# Patient Record
Sex: Male | Born: 1965 | Race: Black or African American | Hispanic: No | Marital: Single | Smoking: Never smoker
Health system: Southern US, Community
[De-identification: ages and names within clinical notes are randomized; demographics above are authoritative.]

## PROBLEM LIST (undated history)

## (undated) DIAGNOSIS — J3089 Other allergic rhinitis: Secondary | ICD-10-CM

## (undated) HISTORY — PX: NO PAST SURGERIES: SHX2092

---

## 2005-07-07 ENCOUNTER — Emergency Department: Payer: Self-pay | Admitting: Emergency Medicine

## 2006-04-13 ENCOUNTER — Emergency Department: Payer: Self-pay | Admitting: Emergency Medicine

## 2006-12-10 ENCOUNTER — Emergency Department: Payer: Self-pay | Admitting: Emergency Medicine

## 2006-12-10 ENCOUNTER — Other Ambulatory Visit: Payer: Self-pay

## 2007-06-21 ENCOUNTER — Emergency Department: Payer: Self-pay | Admitting: Emergency Medicine

## 2008-12-01 ENCOUNTER — Emergency Department: Payer: Self-pay | Admitting: Emergency Medicine

## 2010-03-07 ENCOUNTER — Emergency Department: Payer: Self-pay | Admitting: Emergency Medicine

## 2010-03-10 ENCOUNTER — Inpatient Hospital Stay: Payer: Self-pay | Admitting: Internal Medicine

## 2010-09-29 ENCOUNTER — Emergency Department: Payer: Self-pay | Admitting: Emergency Medicine

## 2010-10-11 ENCOUNTER — Ambulatory Visit: Payer: Self-pay | Admitting: Family Medicine

## 2011-05-24 ENCOUNTER — Emergency Department: Payer: Self-pay | Admitting: Emergency Medicine

## 2012-02-19 ENCOUNTER — Inpatient Hospital Stay: Payer: Self-pay | Admitting: Psychiatry

## 2012-02-19 LAB — ETHANOL: Ethanol %: <0.003 % (ref 0.000–0.080)

## 2012-02-19 LAB — CBC
HCT: 45.5 % (ref 40.0–52.0)
HGB: 15.3 g/dL (ref 13.0–18.0)
MCHC: 33.6 g/dL (ref 32.0–36.0)
MCV: 86 fL (ref 80–100)
Platelet: 232 10*3/uL (ref 150–440)
RBC: 5.29 10*6/uL (ref 4.40–5.90)
RDW: 14.1 % (ref 11.5–14.5)

## 2012-02-19 LAB — DRUG SCREEN, URINE
Amphetamines, Ur Screen: NEGATIVE (ref ?–1000)
Barbiturates, Ur Screen: NEGATIVE (ref ?–200)
Cannabinoid 50 Ng, Ur ~~LOC~~: NEGATIVE (ref ?–50)
Cocaine Metabolite,Ur ~~LOC~~: NEGATIVE (ref ?–300)
MDMA (Ecstasy)Ur Screen: NEGATIVE (ref ?–500)
Methadone, Ur Screen: NEGATIVE (ref ?–300)
Phencyclidine (PCP) Ur S: NEGATIVE (ref ?–25)

## 2012-02-19 LAB — COMPREHENSIVE METABOLIC PANEL
Albumin: 3.8 g/dL (ref 3.4–5.0)
Alkaline Phosphatase: 94 U/L (ref 50–136)
Anion Gap: 7 (ref 7–16)
BUN: 11 mg/dL (ref 7–18)
Bilirubin,Total: 0.3 mg/dL (ref 0.2–1.0)
Calcium, Total: 8.9 mg/dL (ref 8.5–10.1)
Chloride: 104 mmol/L (ref 98–107)
Co2: 29 mmol/L (ref 21–32)
Creatinine: 1.19 mg/dL (ref 0.60–1.30)
Glucose: 57 mg/dL — ABNORMAL LOW (ref 65–99)
Osmolality: 276 (ref 275–301)
SGOT(AST): 26 U/L (ref 15–37)
SGPT (ALT): 34 U/L
Sodium: 140 mmol/L (ref 136–145)

## 2012-02-19 LAB — ACETAMINOPHEN LEVEL: Acetaminophen: <2 ug/mL

## 2012-02-19 LAB — TSH: Thyroid Stimulating Horm: 0.93 u[IU]/mL

## 2012-05-11 ENCOUNTER — Emergency Department: Payer: Self-pay | Admitting: *Deleted

## 2012-12-27 ENCOUNTER — Emergency Department: Payer: Self-pay | Admitting: Internal Medicine

## 2015-02-11 NOTE — Discharge Summary (Signed)
PATIENT NAME:  Adam Cannon, Adam Cannon MR#:  [id number] DATE OF BIRTH:  1966/04/10  DATE OF ADMISSION:  02/19/2012 DATE OF DISCHARGE:  02/24/2012  HOSPITAL COURSE: See dictated History and Physical for details. This 49 year old man with a history of mental illness came to the Emergency Room at the recommendation of outpatient treatment providers for evaluation for treatment of his psychotic symptoms. Initially, he was denying any psychotic symptoms to me but on referral to other documentation and re-discussion with him, he admitted that he was having auditory hallucinations that at times were possibly command in nature. He also admitted that his social situation was chaotic. He had no place to live. He had been noncompliant with medications. The patient was admitted to the hospital for stabilization. He was continued on paroxetine and started on Navane 2 mg twice a day for auditory hallucinations. He was also given trazodone 100 mg at night as needed for sleep. The patient tolerated medications well. No sign of EPS or akathisia. He has participated appropriately in groups. Not shown any dangerous or aggressive behavior on the unit. Shows reasonably good insight. He states a motivation to stay well and stay on medications. He was open to receiving education about medication treatment. He has expressed optimism about his outpatient followup. After talking with his family, he has made plans to live with his sister temporarily. We have confirmed these. At this point the patient is calm, appropriate, and denying any active symptoms.  The plan is for discharge home with followup at [facility].   LABORATORY DATA: Drug screen was negative. TSH 0.93, which is normal. Alcohol undetectable. Chemistry profile showed a low glucose at 57, probably of no significance.  Hematology was normal. Urinalysis was not done. Acetaminophen and salicylates were negative.   DISCHARGE MEDICATIONS:  1. Navane 2 mg twice a day.  2. Trazodone 100  mg at night for sleep as needed.  3. Paxil 20 mg per day.   MENTAL STATUS EXAM:  Neatly dressed and groomed. Good eye contact. Normal psychomotor activity. No sign of akathisia or EPS or parkinsonism. Affect euthymic, reactive, and appropriate. Mood stated as being good. Thoughts appear logical and directed. No evidence of bizarre or delusional or paranoid thinking. Denies suicidal or homicidal ideation. Shows good insight and judgment. Denies any thought of doing anything violent.   DISPOSITION: Discharged to stay at his sister's house. Follow up with [facility] for mental health care.   DIAGNOSIS PRINCIPLE AND PRIMARY:  AXIS I: Schizophrenia, paranoid type.   SECONDARY DIAGNOSES:  AXIS I: No further diagnosis.   AXIS II: No diagnosis.   AXIS III: No diagnosis.   AXIS IV: Moderate to severe from having a chaotic social situation and medication noncompliance and recent worsening of symptoms.   AXIS V: Functioning at time of discharge: 55.     ____________________________ Audery AmelJohn T. Carmaleta Youngers, MD jtc:bjt D: 02/24/2012 11:31:24 ET T: 02/24/2012 14:45:51 ET JOB#: [id number]  cc: Audery AmelJohn T. Phoenix Dresser, MD, <Dictator> Audery AmelJOHN T Gervase Colberg MD ELECTRONICALLY SIGNED 02/25/2012 12:27
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
## 2015-07-04 ENCOUNTER — Emergency Department
Admission: EM | Admit: 2015-07-04 | Discharge: 2015-07-04 | Disposition: A | Discharge status: Home / Self Care | Payer: Self-pay | Attending: Emergency Medicine | Admitting: Emergency Medicine

## 2015-07-04 ENCOUNTER — Encounter: Payer: Self-pay | Admitting: Emergency Medicine

## 2015-07-04 ENCOUNTER — Emergency Department: Payer: Self-pay

## 2015-07-04 DIAGNOSIS — R05 Cough: Secondary | ICD-10-CM | POA: Insufficient documentation

## 2015-07-04 DIAGNOSIS — J029 Acute pharyngitis, unspecified: Secondary | ICD-10-CM | POA: Insufficient documentation

## 2015-07-04 DIAGNOSIS — R059 Cough, unspecified: Secondary | ICD-10-CM

## 2015-07-04 MED ORDER — BENZONATATE 100 MG PO CAPS: ORAL_CAPSULE | ORAL | Status: DC

## 2015-07-04 NOTE — ED Notes (Signed)
Patient ambulatory to triage with steady gait, without difficulty or distress noted; pt reports x 2wks having prod cough with clear sputum; denies sinus congestion or fever

## 2015-07-04 NOTE — ED Provider Notes (Signed)
[facility] [place] Emergency Department Provider Note  ____________________________________________  Time seen: Approximately 8:42 PM  I have reviewed the triage vital signs and the nursing notes.   HISTORY  Chief Complaint Cough   HPI Adam Cannon is a 49 y.o. male is here with complaint of productive cough for 2 weeks. He states is being clear sputum. He denies any sinus congestion or fever. He is been using some over-the-counter medication such as Tylenol and Robitussin without relief. He denies smoking or history of asthma.Denies any wheezing or shortness of breath.   History reviewed. No pertinent past medical history.  There are no active problems to display for this patient.   History reviewed. No pertinent past surgical history.  Current Outpatient Rx  Name  Route  Sig  Dispense  Refill  . benzonatate (TESSALON PERLES) 100 MG capsule      Take 1-2 every 8 hours for cough   30 capsule   0     Allergies Mucinex and Aspirin  No family history on file.  Social History Social History  Substance Use Topics  . Smoking status: Never Smoker   . Smokeless tobacco: None  . Alcohol Use: No    Review of Systems Constitutional: No fever/chills Eyes: No visual changes. ENT: Positive sore throat. Cardiovascular: Denies chest pain. Respiratory: Denies shortness of breath. Gastrointestinal:  No nausea, no vomiting.  No diarrhea.  No constipation. Genitourinary: Negative for dysuria. Musculoskeletal: Negative for back pain. Skin: Negative for rash. Neurological: Negative for headaches, focal weakness or numbness.  10-point ROS otherwise negative.  ____________________________________________   PHYSICAL EXAM:  VITAL SIGNS: ED Triage Vitals  Enc Vitals Group     BP 07/04/15 1928 125/89 mmHg     Pulse Rate 07/04/15 1928 79     Resp 07/04/15 1928 20     Temp 07/04/15 1928 98.5 F (36.9 C)     Temp Source 07/04/15 1928 Oral     SpO2  07/04/15 1928 99 %     Weight 07/04/15 1928 181 lb (82.101 kg)     Height 07/04/15 1928 5\' 10"  (1.778 m)     Head Cir --      Peak Flow --      Pain Score --      Pain Loc --      Pain Edu? --      Excl. in GC? --     Constitutional: Alert and oriented. Well appearing and in no acute distress. Eyes: Conjunctivae are normal. PERRL. EOMI. Head: Atraumatic. Nose: No congestion/rhinnorhea.  EACs and TMs are clear Mouth/Throat: Mucous membranes are moist.  Oropharynx non-erythematous. Neck: No stridor.  Supple Hematological/Lymphatic/Immunilogical: No cervical lymphadenopathy. Cardiovascular: Normal rate, regular rhythm. Grossly normal heart sounds.  Good peripheral circulation. Respiratory: Normal respiratory effort.  No retractions. Lungs CTAB. Gastrointestinal: Soft and nontender. No distention. Musculoskeletal: No lower extremity tenderness nor edema.  No joint effusions. Neurologic:  Normal speech and language. No gross focal neurologic deficits are appreciated. No gait instability. Skin:  Skin is warm, dry and intact. No rash noted. Psychiatric: Mood and affect are normal. Speech and behavior are normal.  ____________________________________________   LABS (all labs ordered are listed, but only abnormal results are displayed)  Labs Reviewed - No data to display RADIOLOGY  Chest x-ray no active cardiopulmonary disease ____________________________________________   PROCEDURES  Procedure(s) performed: None  Critical Care performed: No  ____________________________________________   INITIAL IMPRESSION / ASSESSMENT AND PLAN / ED COURSE  Pertinent labs &  imaging results that were available during my care of the patient were reviewed by me and considered in my medical decision making (see chart for details).  Patient was given a prescription for [employer]. He is to increase fluids, Tylenol as needed. He will follow-up with [facility]  clinic if any continued  problems. ____________________________________________   FINAL CLINICAL IMPRESSION(S) / ED DIAGNOSES  Final diagnoses:  Cough      Tommi Rumps, PA-C 07/04/15 1610  Jennye Moccasin, MD 07/08/15 1155

## 2015-07-04 NOTE — Discharge Instructions (Signed)
Cough, Adult   A cough is a reflex. It helps you clear your throat and airways. A cough can help heal your body. A cough can last 2 or 3 weeks (acute) or may last more than 8 weeks (chronic). Some common causes of a cough can include an infection, allergy, or a cold.  HOME CARE  · Only take medicine as told by your doctor.  · If given, take your medicines (antibiotics) as told. Finish them even if you start to feel better.  · Use a cold steam vaporizer or humidifier in your home. This can help loosen thick spit (secretions).  · Sleep so you are almost sitting up (semi-upright). Use pillows to do this. This helps reduce coughing.  · Rest as needed.  · Stop smoking if you smoke.  GET HELP RIGHT AWAY IF:  · You have yellowish-white fluid (pus) in your thick spit.  · Your cough gets worse.  · Your medicine does not reduce coughing, and you are losing sleep.  · You cough up blood.  · You have trouble breathing.  · Your pain gets worse and medicine does not help.  · You have a fever.  MAKE SURE YOU:   · Understand these instructions.  · Will watch your condition.  · Will get help right away if you are not doing well or get worse.  Document Released: 06/19/2011 Document Revised: 02/20/2014 Document Reviewed: 06/19/2011  ExitCare® Patient Information ©2015 [employer]. This information is not intended to replace advice given to you by your health care provider. Make sure you discuss any questions you have with your health care provider.

## 2016-10-20 HISTORY — PX: WISDOM TOOTH EXTRACTION: SHX21

## 2018-01-05 ENCOUNTER — Ambulatory Visit
Admission: EM | Admit: 2018-01-05 | Discharge: 2018-01-05 | Disposition: A | Discharge status: Home / Self Care | Payer: Self-pay | Attending: Family Medicine | Admitting: Family Medicine

## 2018-01-05 ENCOUNTER — Other Ambulatory Visit: Payer: Self-pay

## 2018-01-05 ENCOUNTER — Encounter: Payer: Self-pay | Admitting: Emergency Medicine

## 2018-01-05 DIAGNOSIS — J302 Other seasonal allergic rhinitis: Secondary | ICD-10-CM

## 2018-01-05 DIAGNOSIS — H73893 Other specified disorders of tympanic membrane, bilateral: Secondary | ICD-10-CM

## 2018-01-05 DIAGNOSIS — H6593 Unspecified nonsuppurative otitis media, bilateral: Secondary | ICD-10-CM

## 2018-01-05 DIAGNOSIS — H6123 Impacted cerumen, bilateral: Secondary | ICD-10-CM

## 2018-01-05 DIAGNOSIS — H6121 Impacted cerumen, right ear: Secondary | ICD-10-CM

## 2018-01-05 MED ORDER — CETIRIZINE HCL 10 MG PO TABS: 10.0000 mg | ORAL_TABLET | Freq: Every day | ORAL | 0 refills | Status: DC

## 2018-01-05 MED ORDER — AZITHROMYCIN 250 MG PO TABS: ORAL_TABLET | ORAL | 0 refills | Status: DC

## 2018-01-05 MED ORDER — FLUTICASONE PROPIONATE 50 MCG/ACT NA SUSP: 1.0000 | Freq: Every day | NASAL | 2 refills | Status: DC

## 2018-01-05 NOTE — ED Provider Notes (Signed)
[facility]    CSN: [id number] Arrival date & time: 01/05/18  1845     History   Chief Complaint Chief Complaint  Patient presents with  . Otalgia    right    HPI Sharmon LeydenMark E Semple is a 52 y.o. male.   Patient is a 52 year old male who presents with complaint of right earache times 2 days.  Patient denies any pain or other symptoms in his left ear.  Patient denies any fever or chills.  Patient does report a runny nose and a history of seasonal allergies but denies any sinus pressure or cough recently.  Patient denies any change in his hearing      History reviewed. No pertinent past medical history.  There are no active problems to display for this patient.   History reviewed. No pertinent surgical history.     Home Medications    Prior to Admission medications   Medication Sig Start Date End Date Taking? Authorizing Provider  azithromycin (ZITHROMAX Z-PAK) 250 MG tablet Take 2 tablets by mouth the first day followed by one tablet daily for next 4 days. 01/05/18   Candis SchatzHarris, Michael D, PA-C  cetirizine (ZYRTEC) 10 MG tablet Take 1 tablet (10 mg total) by mouth daily. 01/05/18   Candis SchatzHarris, Michael D, PA-C  fluticasone (FLONASE) 50 MCG/ACT nasal spray Place 1 spray into both nostrils daily. 01/05/18   Candis SchatzHarris, Michael D, PA-C    Family History Family History  Problem Relation Age of Onset  . Hypertension Mother   . Hypertension Father     Social History Social History   Tobacco Use  . Smoking status: Never Smoker  . Smokeless tobacco: Never Used  Substance Use Topics  . Alcohol use: No  . Drug use: Not on file     Allergies   Mucinex [guaifenesin er] and Aspirin   Review of Systems Review of Systems  As noted above in HPI.  Other systems reviewed and found to be negative   Physical Exam Triage Vital Signs ED Triage Vitals  Enc Vitals Group     BP 01/05/18 1857 (!) 148/83     Pulse Rate 01/05/18 1857 89     Resp 01/05/18 1857 16     Temp  01/05/18 1857 98.2 F (36.8 C)     Temp Source 01/05/18 1857 Oral     SpO2 01/05/18 1857 100 %     Weight 01/05/18 1856 178 lb (80.7 kg)     Height 01/05/18 1856 5' 10.5" (1.791 m)     Head Circumference --      Peak Flow --      Pain Score 01/05/18 1855 7     Pain Loc --      Pain Edu? --      Excl. in GC? --    No data found.  Updated Vital Signs BP (!) 148/83 (BP Location: Left Arm)   Pulse 89   Temp 98.2 F (36.8 C) (Oral)   Resp 16   Ht 5' 10.5" (1.791 m)   Wt 178 lb (80.7 kg)   SpO2 100%   BMI 25.18 kg/m   Visual Acuity Right Eye Distance:   Left Eye Distance:   Bilateral Distance:    Right Eye Near:   Left Eye Near:    Bilateral Near:     Physical Exam  Constitutional: He appears well-developed and well-nourished. No distress.  HENT:  Head: Normocephalic and atraumatic.  Right Ear: External ear normal. No tenderness (no  tenderness to pulling of ear or insertion of scope). Tympanic membrane is not injected and not erythematous. A middle ear effusion is present.  Left Ear: External ear normal. Tympanic membrane is not injected and not erythematous. A middle ear effusion is present.  Nose: Right sinus exhibits no frontal sinus tenderness. Left sinus exhibits no maxillary sinus tenderness and no frontal sinus tenderness.  Mouth/Throat: Uvula is midline.  Both ear canals initially obstructed with cerumen, right appears more impacted than the left.  Clear post nasal drainage  Eyes: EOM are normal. Pupils are equal, round, and reactive to light.  Neck: Normal range of motion.     UC Treatments / Results  Labs (all labs ordered are listed, but only abnormal results are displayed) Labs Reviewed - No data to display  EKG  EKG Interpretation None       Radiology No results found.  Procedures Procedures (including critical care time)  Medications Ordered in UC Medications - No data to display   Initial Impression / Assessment and Plan / UC Course    I have reviewed the triage vital signs and the nursing notes.  Pertinent labs & imaging results that were available during my care of the patient were reviewed by me and considered in my medical decision making (see chart for details).     Patient complaining of some right earache but not the left.  On initial exam, the right eardrum is a obstructed with cerumen, possibly some impaction.  The left is obstructed but the wax seems moist is not fill the entire ear canal.  Will irrigate the right ear for evaluation.  After irrigation, both canals are clear but there is fluid behind both eardrums but no signs of current infection.  We will give him Flonase and Zyrtec prescription.  We will also give him a prescription for Zithromax that he can take in 3-4 days if he does not have any improvement in his ear pain.  Final Clinical Impressions(s) / UC Diagnoses   Final diagnoses:  Seasonal allergies  Fluid level behind tympanic membrane of both ears    ED Discharge Orders        Ordered    fluticasone (FLONASE) 50 MCG/ACT nasal spray  Daily     01/05/18 1936    cetirizine (ZYRTEC) 10 MG tablet  Daily     01/05/18 1936    azithromycin (ZITHROMAX Z-PAK) 250 MG tablet     01/05/18 1936       Controlled Substance Prescriptions [place] Controlled Substance Registry consulted? Not Applicable   Candis Schatz, PA-C 01/05/18 1610

## 2018-01-05 NOTE — ED Triage Notes (Signed)
Patient c/o right ear pain that started 2 days ago.  

## 2018-01-05 NOTE — Discharge Instructions (Signed)
-  Flonase: 1 spray to each nostril in the mornings.  Spray slightly directed towards the ears -Zyrtec: 1 tablet daily at night -Both of these medications are available over-the-counter and with generics.  Prescription provided as insurance or health savings account may pay for it with a prescription. -Azithromycin: Can start in 2-3 days if no improvement in your ear pain or if ear pain worsens or you develop a fever.

## 2018-01-11 ENCOUNTER — Ambulatory Visit
Admission: EM | Admit: 2018-01-11 | Discharge: 2018-01-11 | Disposition: A | Discharge status: Home / Self Care | Payer: Self-pay | Attending: Family Medicine | Admitting: Family Medicine

## 2018-01-11 ENCOUNTER — Other Ambulatory Visit: Payer: Self-pay

## 2018-01-11 DIAGNOSIS — H6981 Other specified disorders of Eustachian tube, right ear: Secondary | ICD-10-CM

## 2018-01-11 HISTORY — DX: Other allergic rhinitis: J30.89

## 2018-01-11 NOTE — Discharge Instructions (Addendum)
For Pain take Tylenol 500 mg combined with ibuprofen 400 mg every 6 hours as necessary.  Flonase nasal spray once daily with 2 sprays each nostril and sniff gently like sniffing arose.  If you are not improving in 1-2 weeks follow-up with an ear nose and throat specialist

## 2018-01-11 NOTE — ED Provider Notes (Signed)
[facility]    CSN: [id number] Arrival date & time: 01/11/18  1550     History   Chief Complaint Chief Complaint  Patient presents with  . Otalgia    HPI Adam Cannon is a 52 y.o. male.   HPI  52 year old male who was seen here last week for ear pain had undergone cerumen impaction removal given an Z-Pak and Flonase.  At this the patient states that his right ear pain is worse than before.  He states it is a constant 5 out of 10.  The pain will radiate along his jawline inferiorly into his neck. Does Not pop or click. Denies Drainage.  His hearing is better following the removal of the cerumen.  Had no fever or chills.  Is not bother him to move his outer ear.          Past Medical History:  Diagnosis Date  . Environmental and seasonal allergies     There are no active problems to display for this patient.   History reviewed. No pertinent surgical history.     Home Medications    Prior to Admission medications   Medication Sig Start Date End Date Taking? Authorizing Provider  cetirizine (ZYRTEC) 10 MG tablet Take 1 tablet (10 mg total) by mouth daily. 01/05/18   Candis SchatzHarris, Michael D, PA-C  fluticasone (FLONASE) 50 MCG/ACT nasal spray Place 1 spray into both nostrils daily. 01/05/18   Candis SchatzHarris, Michael D, PA-C    Family History Family History  Problem Relation Age of Onset  . Hypertension Mother   . Hypertension Father     Social History Social History   Tobacco Use  . Smoking status: Never Smoker  . Smokeless tobacco: Never Used  Substance Use Topics  . Alcohol use: No  . Drug use: Not on file     Allergies   Mucinex [guaifenesin er] and Aspirin   Review of Systems Review of Systems  Constitutional: Positive for activity change. Negative for chills, fatigue and fever.  HENT: Positive for ear pain. Negative for ear discharge.   All other systems reviewed and are negative.    Physical Exam Triage Vital Signs ED Triage Vitals  Enc  Vitals Group     BP 01/11/18 1606 118/78     Pulse Rate 01/11/18 1606 78     Resp 01/11/18 1606 16     Temp 01/11/18 1606 98 F (36.7 C)     Temp Source 01/11/18 1606 Oral     SpO2 01/11/18 1606 100 %     Weight 01/11/18 1608 178 lb (80.7 kg)     Height 01/11/18 1608 5' 10.5" (1.791 m)     Head Circumference --      Peak Flow --      Pain Score 01/11/18 1608 3     Pain Loc --      Pain Edu? --      Excl. in GC? --    No data found.  Updated Vital Signs BP 118/78 (BP Location: Left Arm)   Pulse 78   Temp 98 F (36.7 C) (Oral)   Resp 16   Ht 5' 10.5" (1.791 m)   Wt 178 lb (80.7 kg)   SpO2 100%   BMI 25.18 kg/m   Visual Acuity Right Eye Distance:   Left Eye Distance:   Bilateral Distance:    Right Eye Near:   Left Eye Near:    Bilateral Near:     Physical Exam  Constitutional: He is oriented to person, place, and time. He appears well-developed and well-nourished. No distress.  HENT:  Head: Normocephalic.  Left Ear: External ear normal.  Nose: Nose normal.  Mouth/Throat: Oropharynx is clear and moist. No oropharyngeal exudate.  Right Ear shows a middle ear effusion present.No   discomfort with the movement of the auricle or tragus.  Canal appears normal.  Eyes: Pupils are equal, round, and reactive to light. EOM are normal. Right eye exhibits no discharge. Left eye exhibits no discharge.  Neck: Normal range of motion.  Musculoskeletal: Normal range of motion.  Lymphadenopathy:    He has no cervical adenopathy.  Neurological: He is alert and oriented to person, place, and time.  Skin: Skin is warm and dry. He is not diaphoretic.  Psychiatric: He has a normal mood and affect. His behavior is normal. Judgment and thought content normal.  Nursing note and vitals reviewed.    UC Treatments / Results  Labs (all labs ordered are listed, but only abnormal results are displayed) Labs Reviewed - No data to display  EKG None Radiology No results  found.  Procedures Procedures (including critical care time)  Medications Ordered in UC Medications - No data to display   Initial Impression / Assessment and Plan / UC Course  I have reviewed the triage vital signs and the nursing notes.  Pertinent labs & imaging results that were available during my care of the patient were reviewed by me and considered in my medical decision making (see chart for details).     Plan: 1. Test/x-ray results and diagnosis reviewed with patient 2. rx as per orders; risks, benefits, potential side effects reviewed with patient 3. Recommend supportive treatment with proper use of Flonase that he will use on a daily basis.  Type biotics have already been given and he does not require any further at this point time.  He may use over-the-counter antihistamines.  I have given him the name of an ear nose and throat specialist that he may contact if he is not improving in a week or 2.  For pain I have recommended Tylenol 500 mg combined with ibuprofen 400 mg to be taken every 6 hours PRN. 4. F/u prn if symptoms worsen or don't improve   Final Clinical Impressions(s) / UC Diagnoses   Final diagnoses:  Eustachian tube dysfunction, right    ED Discharge Orders    None       Controlled Substance Prescriptions [place] Controlled Substance Registry consulted? Not Applicable   Lutricia Feil, PA-C 01/11/18 1721

## 2018-01-11 NOTE — ED Triage Notes (Signed)
Pt reports he was seen last week for right ear pain and cerumen impaction. Had cerumen disimpaction performed here and given ABX and decongestants. Pain is not better.

## 2018-06-24 ENCOUNTER — Other Ambulatory Visit: Payer: Self-pay

## 2018-06-24 ENCOUNTER — Ambulatory Visit
Admission: EM | Admit: 2018-06-24 | Discharge: 2018-06-24 | Disposition: A | Discharge status: Home / Self Care | Payer: Self-pay | Attending: Family Medicine | Admitting: Family Medicine

## 2018-06-24 ENCOUNTER — Encounter: Payer: Self-pay | Admitting: Emergency Medicine

## 2018-06-24 DIAGNOSIS — R0789 Other chest pain: Secondary | ICD-10-CM

## 2018-06-24 DIAGNOSIS — R05 Cough: Secondary | ICD-10-CM

## 2018-06-24 DIAGNOSIS — J069 Acute upper respiratory infection, unspecified: Secondary | ICD-10-CM

## 2018-06-24 MED ORDER — CETIRIZINE-PSEUDOEPHEDRINE ER 5-120 MG PO TB12: 1.0000 | ORAL_TABLET | Freq: Two times a day (BID) | ORAL | 0 refills | Status: DC

## 2018-06-24 MED ORDER — BENZONATATE 200 MG PO CAPS: ORAL_CAPSULE | ORAL | 0 refills | Status: DC

## 2018-06-24 MED ORDER — HYDROCOD POLST-CPM POLST ER 10-8 MG/5ML PO SUER: 5.0000 mL | Freq: Two times a day (BID) | ORAL | 0 refills | Status: DC

## 2018-06-24 MED ORDER — FLUTICASONE PROPIONATE 50 MCG/ACT NA SUSP
2.0000 | Freq: Every day | NASAL | 0 refills | Status: DC
Start: 2018-06-24 — End: 2018-11-01

## 2018-06-24 NOTE — ED Provider Notes (Signed)
[facility]    CSN: [id number] Arrival date & time: 06/24/18  0844     History   Chief Complaint Chief Complaint  Patient presents with  . Cough  . Sinus Problem  . Chest Pain    HPI Adam Cannon is a 52 y.o. male.   HPI  52 year old male presents with sinus congestion, sinus pressure, nasal congestion, headaches, cough and chest congestion.  He has had some chest tightness that started on Monday.  No fever or chills.  The cough has been nonproductive.  Is a non-smoker.  His O2 sats are 99% on room air.        Past Medical History:  Diagnosis Date  . Environmental and seasonal allergies     There are no active problems to display for this patient.   History reviewed. No pertinent surgical history.     Home Medications    Prior to Admission medications   Medication Sig Start Date End Date Taking? Authorizing Provider  benzonatate (TESSALON) 200 MG capsule Take one cap TID PRN cough 06/24/18   Lutricia Feil, PA-C  cetirizine-pseudoephedrine (ZYRTEC-D) 5-120 MG tablet Take 1 tablet by mouth 2 (two) times daily. 06/24/18   Lutricia Feil, PA-C  chlorpheniramine-HYDROcodone (TUSSIONEX PENNKINETIC ER) 10-8 MG/5ML SUER Take 5 mLs by mouth 2 (two) times daily. 06/24/18   Lutricia Feil, PA-C  fluticasone (FLONASE) 50 MCG/ACT nasal spray Place 2 sprays into both nostrils daily. 06/24/18   Lutricia Feil, PA-C    Family History Family History  Problem Relation Age of Onset  . Hypertension Mother   . Hypertension Father     Social History Social History   Tobacco Use  . Smoking status: Never Smoker  . Smokeless tobacco: Never Used  Substance Use Topics  . Alcohol use: No  . Drug use: Never     Allergies   Mucinex [guaifenesin er] and Aspirin   Review of Systems Review of Systems  Constitutional: Positive for activity change. Negative for appetite change, chills, fatigue and fever.  HENT: Positive for congestion, postnasal drip, sinus  pressure and sinus pain.   Respiratory: Positive for cough.   All other systems reviewed and are negative.    Physical Exam Triage Vital Signs ED Triage Vitals  Enc Vitals Group     BP 06/24/18 0854 123/80     Pulse Rate 06/24/18 0854 80     Resp 06/24/18 0854 16     Temp 06/24/18 0854 97.9 F (36.6 C)     Temp Source 06/24/18 0854 Oral     SpO2 06/24/18 0854 99 %     Weight 06/24/18 0852 182 lb (82.6 kg)     Height 06/24/18 0852 5' 10.5" (1.791 m)     Head Circumference --      Peak Flow --      Pain Score 06/24/18 0852 5     Pain Loc --      Pain Edu? --      Excl. in GC? --    No data found.  Updated Vital Signs BP 123/80 (BP Location: Left Arm)   Pulse 80   Temp 97.9 F (36.6 C) (Oral)   Resp 16   Ht 5' 10.5" (1.791 m)   Wt 182 lb (82.6 kg)   SpO2 99%   BMI 25.75 kg/m   Visual Acuity Right Eye Distance:   Left Eye Distance:   Bilateral Distance:    Right Eye Near:   Left  Eye Near:    Bilateral Near:     Physical Exam  Constitutional: He is oriented to person, place, and time. He appears well-developed and well-nourished.  Non-toxic appearance. He does not appear ill. No distress.  HENT:  Head: Normocephalic.  Eyes: Pupils are equal, round, and reactive to light.  Neck: Normal range of motion.  Pulmonary/Chest: Effort normal and breath sounds normal.  Musculoskeletal: Normal range of motion.  Lymphadenopathy:    He has no cervical adenopathy.  Neurological: He is alert and oriented to person, place, and time.  Skin: Skin is warm and dry.  Psychiatric: He has a normal mood and affect. His behavior is normal.  Nursing note and vitals reviewed.    UC Treatments / Results  Labs (all labs ordered are listed, but only abnormal results are displayed) Labs Reviewed - No data to display  EKG None  Radiology No results found.  Procedures Procedures (including critical care time)  Medications Ordered in UC Medications - No data to  display  Initial Impression / Assessment and Plan / UC Course  I have reviewed the triage vital signs and the nursing notes.  Pertinent labs & imaging results that were available during my care of the patient were reviewed by me and considered in my medical decision making (see chart for details).     Plan: 1. Test/x-ray results and diagnosis reviewed with patient 2. rx as per orders; risks, benefits, potential side effects reviewed with patient 3. Recommend supportive treatment with plenty of fluids.  Use Flonase daily for the next 2 to 3 weeks.  We will treat with cough suppressants to allow him to have better rest.  Told him this is likely a viral illness does not require antibiotics at this time.  It will have to run its course.  However if he runs high fevers or is not improving in 7 to 10 days he should return to our clinic or be seen by a primary care physician 4. F/u prn if symptoms worsen or don't improve  Final Clinical Impressions(s) / UC Diagnoses   Final diagnoses:  Upper respiratory tract infection, unspecified type   Discharge Instructions   None    ED Prescriptions    Medication Sig Dispense Auth. Provider   benzonatate (TESSALON) 200 MG capsule Take one cap TID PRN cough 30 capsule Lutricia Feil, PA-C   chlorpheniramine-HYDROcodone (TUSSIONEX PENNKINETIC ER) 10-8 MG/5ML SUER Take 5 mLs by mouth 2 (two) times daily. 115 mL Ovid Curd P, PA-C   fluticasone (FLONASE) 50 MCG/ACT nasal spray Place 2 sprays into both nostrils daily. 16 g Ovid Curd P, PA-C   cetirizine-pseudoephedrine (ZYRTEC-D) 5-120 MG tablet Take 1 tablet by mouth 2 (two) times daily. 30 tablet Lutricia Feil, PA-C     Controlled Substance Prescriptions [place] Controlled Substance Registry consulted? Not Applicable   Lutricia Feil, PA-C 06/24/18 1220

## 2018-06-24 NOTE — ED Triage Notes (Signed)
Patient c/o sinus congestion, sinus pressure, nasal congestion, HAs, cough and chest congestion with some chest tightness that all started on Monday.  Patient denies fevers.

## 2018-10-04 ENCOUNTER — Other Ambulatory Visit: Payer: Self-pay | Admitting: Otolaryngology

## 2018-10-04 DIAGNOSIS — H9201 Otalgia, right ear: Secondary | ICD-10-CM

## 2018-10-04 DIAGNOSIS — R220 Localized swelling, mass and lump, head: Secondary | ICD-10-CM

## 2018-10-25 ENCOUNTER — Ambulatory Visit
Admission: RE | Admit: 2018-10-25 | Discharge: 2018-10-25 | Disposition: A | Discharge status: Home / Self Care | Payer: No Typology Code available for payment source | Source: Ambulatory Visit | Attending: Otolaryngology | Admitting: Otolaryngology

## 2018-10-25 DIAGNOSIS — H9201 Otalgia, right ear: Secondary | ICD-10-CM | POA: Diagnosis present

## 2018-10-25 DIAGNOSIS — R220 Localized swelling, mass and lump, head: Secondary | ICD-10-CM | POA: Insufficient documentation

## 2018-10-25 MED ORDER — IOHEXOL 300 MG/ML  SOLN
75.0000 mL | Freq: Once | INTRAMUSCULAR | Status: AC | PRN
  Administered 2018-10-25: 75 mL via INTRAVENOUS

## 2018-11-01 ENCOUNTER — Ambulatory Visit (INDEPENDENT_AMBULATORY_CARE_PROVIDER_SITE_OTHER): Payer: No Typology Code available for payment source | Admitting: Nurse Practitioner

## 2018-11-01 ENCOUNTER — Encounter: Payer: Self-pay | Admitting: Nurse Practitioner

## 2018-11-01 VITALS — BP 132/76 | HR 85 | Temp 98.6°F | Ht 70.5 in | Wt 189.2 lb

## 2018-11-01 DIAGNOSIS — Z7689 Persons encountering health services in other specified circumstances: Secondary | ICD-10-CM

## 2018-11-01 DIAGNOSIS — S86911A Strain of unspecified muscle(s) and tendon(s) at lower leg level, right leg, initial encounter: Secondary | ICD-10-CM | POA: Diagnosis not present

## 2018-11-01 NOTE — Patient Instructions (Addendum)
Adam Cannon,   Thank you for coming in to clinic today.  1. You have a hamstring muscle strain.  - Start taking Aleve 220 mg one tablet twice daily.  (May take 2 if needed).  - Start taking Tylenol extra strength 1 to 2 tablets every 6-8 hours for aches or fever/chills for next few days as needed.  Do not take more than 3,000 mg in 24 hours from all medicines.  - Use heat and ice.  Apply this for 15 minutes at a time 6-8 times per day.   - Muscle rub with lidocaine, lidocaine patch, Biofreeze, or tiger balm for topical pain relief.  Avoid using this with heat and ice to avoid burns.  2. Consider having a trainer assess proper form.   3. Knee rehab exercises below. - Work on balance half ball for engaging all of the small muscles around your knee.  Please schedule a follow-up appointment with Wilhelmina Mcardle, AGNP. Return in about 2 months (around 12/31/2018) for annual physical.  If you have any other questions or concerns, please feel free to call the clinic or send a message through MyChart. You may also schedule an earlier appointment if necessary.  You will receive a survey after today's visit either digitally by e-mail or paper by [employer]. Your experiences and feedback matter to [place].  Please respond so we know how we are doing as we provide care for you.   Wilhelmina Mcardle, DNP, AGNP-BC Adult Gerontology Nurse Practitioner [facility], [facility]    Knee Exercises              Ask your health care provider which exercises are safe for you. Do exercises exactly as told by your health care provider and adjust them as directed. It is normal to feel mild stretching, pulling, tightness, or discomfort as you do these exercises, but you should stop right away if you feel sudden pain or your pain gets worse.Do not begin these exercises until told by your health care provider. STRETCHING AND RANGE OF MOTION EXERCISES These exercises warm up your muscles and joints and  improve the movement and flexibility of your knee. These exercises also help to relieve pain, numbness, and tingling. Exercise A: Knee Extension, Prone 1. Lie on your abdomen on a bed. 2. Place your left / right knee just beyond the edge of the surface so your knee is not on the bed. You can put a towel under your left / right thigh just above your knee for comfort. 3. Relax your leg muscles and allow gravity to straighten your knee. You should feel a stretch behind your left / right knee. 4. Hold this position for __________ seconds. 5. Scoot up so your knee is supported between repetitions. Repeat __________ times. Complete this stretch __________ times a day. Exercise B: Knee Flexion, Active 1. Lie on your back with both knees straight. If this causes back discomfort, bend your left / right knee so your foot is flat on the floor. 2. Slowly slide your left / right heel back toward your buttocks until you feel a gentle stretch in the front of your knee or thigh. 3. Hold this position for __________ seconds. 4. Slowly slide your left / right heel back to the starting position. Repeat __________ times. Complete this exercise __________ times a day. Exercise C: Quadriceps, Prone 1. Lie on your abdomen on a firm surface, such as a bed or padded floor. 2. Bend your left / right  knee and hold your ankle. If you cannot reach your ankle or pant leg, loop a belt around your foot and grab the belt instead. 3. Gently pull your heel toward your buttocks. Your knee should not slide out to the side. You should feel a stretch in the front of your thigh and knee. 4. Hold this position for __________ seconds. Repeat __________ times. Complete this stretch __________ times a day. Exercise D: Hamstring, Supine 1. Lie on your back. 2. Loop a belt or towel over the ball of your left / right foot. The ball of your foot is on the walking surface, right under your toes. 3. Straighten your left / right knee and  slowly pull on the belt to raise your leg until you feel a gentle stretch behind your knee. ? Do not let your left / right knee bend while you do this. ? Keep your other leg flat on the floor. 4. Hold this position for __________ seconds. Repeat __________ times. Complete this stretch __________ times a day. STRENGTHENING EXERCISES These exercises build strength and endurance in your knee. Endurance is the ability to use your muscles for a long time, even after they get tired. Exercise E: Quadriceps, Isometric 1. Lie on your back with your left / right leg extended and your other knee bent. Put a rolled towel or small pillow under your knee if told by your health care provider. 2. Slowly tense the muscles in the front of your left / right thigh. You should see your kneecap slide up toward your hip or see increased dimpling just above the knee. This motion will push the back of the knee toward the floor. 3. For __________ seconds, keep the muscle as tight as you can without increasing your pain. 4. Relax the muscles slowly and completely. Repeat __________ times. Complete this exercise __________ times a day. Exercise F: Straight Leg Raises - Quadriceps 1. Lie on your back with your left / right leg extended and your other knee bent. 2. Tense the muscles in the front of your left / right thigh. You should see your kneecap slide up or see increased dimpling just above the knee. Your thigh may even shake a bit. 3. Keep these muscles tight as you raise your leg 4-6 inches (10-15 cm) off the floor. Do not let your knee bend. 4. Hold this position for __________ seconds. 5. Keep these muscles tense as you lower your leg. 6. Relax your muscles slowly and completely after each repetition. Repeat __________ times. Complete this exercise __________ times a day. Exercise G: Hamstring, Isometric 1. Lie on your back on a firm surface. 2. Bend your left / right knee approximately __________ degrees. 3. Dig  your left / right heel into the surface as if you are trying to pull it toward your buttocks. Tighten the muscles in the back of your thighs to dig as hard as you can without increasing any pain. 4. Hold this position for __________ seconds. 5. Release the tension gradually and allow your muscles to relax completely for __________ seconds after each repetition. Repeat __________ times. Complete this exercise __________ times a day. Exercise H: Hamstring Curls If told by your health care provider, do this exercise while wearing ankle weights. Begin with __________ weights. Then increase the weight by 1 lb (0.5 kg) increments. Do not wear ankle weights that are more than __________. 1. Lie on your abdomen with your legs straight. 2. Bend your left / right knee as far as  you can without feeling pain. Keep your hips flat against the floor. 3. Hold this position for __________ seconds. 4. Slowly lower your leg to the starting position. Repeat __________ times. Complete this exercise __________ times a day. Exercise I: Squats (Quadriceps) 1. Stand in front of a table, with your feet and knees pointing straight ahead. You may rest your hands on the table for balance but not for support. 2. Slowly bend your knees and lower your hips like you are going to sit in a chair. ? Keep your weight over your heels, not over your toes. ? Keep your lower legs upright so they are parallel with the table legs. ? Do not let your hips go lower than your knees. ? Do not bend lower than told by your health care provider. ? If your knee pain increases, do not bend as low. 3. Hold the squat position for __________ seconds. 4. Slowly push with your legs to return to standing. Do not use your hands to pull yourself to standing. Repeat __________ times. Complete this exercise __________ times a day. Exercise J: Wall Slides (Quadriceps) 1. Lean your back against a smooth wall or door while you walk your feet out 18-24 inches  (46-61 cm) from it. 2. Place your feet hip-width apart. 3. Slowly slide down the wall or door until your knees bend __________ degrees. Keep your knees over your heels, not over your toes. Keep your knees in line with your hips. 4. Hold for __________ seconds. Repeat __________ times. Complete this exercise __________ times a day. Exercise K: Straight Leg Raises - Hip Abductors 1. Lie on your side with your left / right leg in the top position. Lie so your head, shoulder, knee, and hip line up. You may bend your bottom knee to help you keep your balance. 2. Roll your hips slightly forward so your hips are stacked directly over each other and your left / right knee is facing forward. 3. Leading with your heel, lift your top leg 4-6 inches (10-15 cm). You should feel the muscles in your outer hip lifting. ? Do not let your foot drift forward. ? Do not let your knee roll toward the ceiling. 4. Hold this position for __________ seconds. 5. Slowly return your leg to the starting position. 6. Let your muscles relax completely after each repetition. Repeat __________ times. Complete this exercise __________ times a day. Exercise L: Straight Leg Raises - Hip Extensors 1. Lie on your abdomen on a firm surface. You can put a pillow under your hips if that is more comfortable. 2. Tense the muscles in your buttocks and lift your left / right leg about 4-6 inches (10-15 cm). Keep your knee straight as you lift your leg. 3. Hold this position for __________ seconds. 4. Slowly lower your leg to the starting position. 5. Let your leg relax completely after each repetition. Repeat __________ times. Complete this exercise __________ times a day. This information is not intended to replace advice given to you by your health care provider. Make sure you discuss any questions you have with your health care provider. Document Released: 08/20/2005 Document Revised: 06/30/2016 Document Reviewed: 08/12/2015 [employer]  Inter[employer] Patient Education  2019 [employer]

## 2018-11-01 NOTE — Progress Notes (Signed)
Subjective:    Patient ID: Adam LeydenMark E Sidener, male    DOB: [birth year], 53 y.o.   MRN: [id number]  Adam Cannon is a 53 y.o. male presenting on 11/01/2018 for Establish Care (Intermittent sharp pain. Right knee pain. Possible related to injury from exercise or prolong standing. Pt states the knee gives out from time to time x 2 weeks. )  HPI Establish Care New Provider Pt last seen by PCP many years ago.  [facility].    Right Knee pain Knee lifts at gym, stands all day.  Feels catching/weakness.  Then okay after several days.   - Is not having pain today. - Pain last about 1-2 weeks ago.  Sharp pains in back side of kne with "catching" "babying it after that" Rests, keeps elevated that nigh.  Then is okay.  Makes him feel like he is going to fall.  After "catch" shoots around front.  Usually no pain with sitting, stiff when standing/sore after that. - Believes he may have aggravated it on leg curls at gym.  Past Medical History:  Diagnosis Date  . Environmental and seasonal allergies    No past surgical history on file. Social History   Socioeconomic History  . Marital status: Single    Spouse name: Not on file  . Number of children: Not on file  . Years of education: Not on file  . Highest education level: Not on file  Occupational History  . Not on file  Social Needs  . Financial resource strain: Not on file  . Food insecurity:    Worry: Not on file    Inability: Not on file  . Transportation needs:    Medical: Not on file    Non-medical: Not on file  Tobacco Use  . Smoking status: Never Smoker  . Smokeless tobacco: Never Used  Substance and Sexual Activity  . Alcohol use: No  . Drug use: Never  . Sexual activity: Not on file  Lifestyle  . Physical activity:    Days per week: Not on file    Minutes per session: Not on file  . Stress: Not on file  Relationships  . Social connections:    Talks on phone: Not on file    Gets  together: Not on file    Attends religious service: Not on file    Active member of club or organization: Not on file    Attends meetings of clubs or organizations: Not on file    Relationship status: Not on file  . Intimate partner violence:    Fear of current or ex partner: Not on file    Emotionally abused: Not on file    Physically abused: Not on file    Forced sexual activity: Not on file  Other Topics Concern  . Not on file  Social History Narrative  . Not on file   Family History  Problem Relation Age of Onset  . Hypertension Mother   . Hypertension Father    Current Outpatient Medications on File Prior to Visit  Medication Sig  . aspirin 325 MG tablet Take 325 mg by mouth daily.   No current facility-administered medications on file prior to visit.     Review of Systems  Constitutional: Negative for activity change, appetite change, fatigue and unexpected weight change.  HENT: Negative for congestion, hearing loss and trouble swallowing.   Eyes: Negative for visual disturbance.  Respiratory: Negative for choking, shortness of breath  and wheezing.   Cardiovascular: Negative for chest pain and palpitations.  Gastrointestinal: Negative for abdominal pain, blood in stool, constipation and diarrhea.  Genitourinary: Negative for difficulty urinating, discharge, flank pain, genital sores, penile pain, penile swelling, scrotal swelling and testicular pain.  Musculoskeletal: Negative for arthralgias, back pain and myalgias.  Skin: Negative for color change, rash and wound.  Allergic/Immunologic: Negative for environmental allergies.  Neurological: Negative for dizziness, seizures, weakness and headaches.  Psychiatric/Behavioral: Negative for behavioral problems, decreased concentration, dysphoric mood, sleep disturbance and suicidal ideas. The patient is not nervous/anxious.    Per HPI unless specifically indicated above     Objective:    BP 132/76 (BP Location: Left Arm,  Patient Position: Sitting, Cuff Size: Large)   Pulse 85   Temp 98.6 F (37 C) (Oral)   Ht 5' 10.5" (1.791 m)   Wt 189 lb 3.2 oz (85.8 kg)   BMI 26.76 kg/m   Wt Readings from Last 3 Encounters:  11/01/18 189 lb 3.2 oz (85.8 kg)  06/24/18 182 lb (82.6 kg)  01/11/18 178 lb (80.7 kg)    Physical Exam Vitals signs reviewed.  Constitutional:      General: He is not in acute distress.    Appearance: He is well-developed.  HENT:     Head: Normocephalic and atraumatic.  Cardiovascular:     Rate and Rhythm: Normal rate and regular rhythm.     Pulses:          Radial pulses are 2+ on the right side and 2+ on the left side.       Posterior tibial pulses are 1+ on the right side and 1+ on the left side.     Heart sounds: Normal heart sounds, S1 normal and S2 normal.  Pulmonary:     Effort: Pulmonary effort is normal. No respiratory distress.     Breath sounds: Normal breath sounds and air entry.  Musculoskeletal:     Right lower leg: No edema.     Left lower leg: No edema.     Comments: Bilateral Knees Inspection: Normal appearance and symmetrical. No ecchymosis or effusion. Palpation: Non-tender.  No crepitus ROM: Full active ROM bilaterally Special Testing: Lachman / Valgus/Varus tests negative with intact ligaments (ACL, MCL, LCL). McMurray negative without meniscus symptoms. Strength: 5/5 intact knee flex/ext, ankle dorsi/plantarflex Neurovascular: distally intact sensation light touch and pulses   Skin:    General: Skin is warm and dry.     Capillary Refill: Capillary refill takes less than 2 seconds.  Neurological:     Mental Status: He is alert and oriented to person, place, and time.  Psychiatric:        Attention and Perception: Attention normal.        Mood and Affect: Mood and affect normal.        Behavior: Behavior normal. Behavior is cooperative.       Assessment & Plan:   Problem List Items Addressed This Visit    None    Visit Diagnoses    Knee strain,  right, initial encounter    -  Primary Pain likely self-limited.  Muscle strain possible complicated by improper body mechanics with exercise/lifting as this is what patient was participating in prior to onset of pain.  Plan:  1. Treat with OTC pain meds (acetaminophen and ibuprofen).  Discussed alternate dosing and max dosing. 2. Apply heat and/or ice to affected area. 3. May also apply a muscle rub with lidocaine or lidocaine patch after  heat or ice. 4. Provided knee rehab exercises.  Encourage physical therapy, but patient declines today.  Encouraged body mechanics assessment with a certified trainer  5. Follow up 2-4 weeks prn.     Encounter to establish care     Previous PCP was many years ago.  Records will not be requested.  Past medical, family, and surgical history reviewed w/ patient in clinic today.       Follow up plan: Return in about 2 months (around 12/31/2018) for annual physical.  Wilhelmina Mcardle, DNP, AGPCNP-BC Adult Gerontology Primary Care Nurse Practitioner [facility] 11/01/2018, 2:38 PM

## 2018-11-07 ENCOUNTER — Encounter: Payer: Self-pay | Admitting: Nurse Practitioner

## 2018-12-10 ENCOUNTER — Ambulatory Visit
Admission: EM | Admit: 2018-12-10 | Discharge: 2018-12-10 | Disposition: A | Discharge status: Home / Self Care | Payer: No Typology Code available for payment source | Attending: Family Medicine | Admitting: Family Medicine

## 2018-12-10 ENCOUNTER — Encounter: Payer: Self-pay | Admitting: Emergency Medicine

## 2018-12-10 ENCOUNTER — Other Ambulatory Visit: Payer: Self-pay

## 2018-12-10 DIAGNOSIS — J40 Bronchitis, not specified as acute or chronic: Secondary | ICD-10-CM

## 2018-12-10 MED ORDER — BENZONATATE 200 MG PO CAPS: ORAL_CAPSULE | ORAL | 0 refills | Status: DC

## 2018-12-10 MED ORDER — HYDROCOD POLST-CPM POLST ER 10-8 MG/5ML PO SUER: 5.0000 mL | Freq: Two times a day (BID) | ORAL | 0 refills | Status: DC

## 2018-12-10 MED ORDER — PREDNISONE 20 MG PO TABS: ORAL_TABLET | ORAL | 0 refills | Status: DC

## 2018-12-10 NOTE — ED Provider Notes (Signed)
[facility]    CSN: [id number] Arrival date & time: 12/10/18  1012     History   Chief Complaint Chief Complaint  Patient presents with  . Cough    HPI Adam Cannon is a 53 y.o. male.   HPI  -year-old male presents with cough and congestion that he has had for 3 weeks.  Taken over-the-counter preparations none of which have given him relief.  He has had no fever or chills.  He does not endorse a sinus problem.  Will occasionally have some production but this is infrequent.  He definitely states that his nighttime is the worst time for he is more prone to coughing and keeping him awake.  Afebrile.  Pulse rate of 85 respirations of 18 and O2 sats on room air 99%.           Past Medical History:  Diagnosis Date  . Environmental and seasonal allergies     There are no active problems to display for this patient.   Past Surgical History:  Procedure Laterality Date  . NO PAST SURGERIES         Home Medications    Prior to Admission medications   Medication Sig Start Date End Date Taking? Authorizing Provider  benzonatate (TESSALON) 200 MG capsule Take one cap TID PRN cough 12/10/18   Lutricia Feiloemer, Heron Pitcock P, PA-C  chlorpheniramine-HYDROcodone [facility](TUSSIONEX PENNKINETIC ER) 10-8 MG/5ML SUER Take 5 mLs by mouth 2 (two) times daily. 12/10/18   Lutricia Feiloemer, Delorus Langwell P, PA-C  predniSONE (DELTASONE) 20 MG tablet Take 2 tablets (40 mg) daily by mouth 12/10/18   Lutricia Feiloemer, Marcella Dunnaway P, PA-C    Family History Family History  Problem Relation Age of Onset  . Hypertension Mother   . Breast cancer Mother   . Hypertension Father   . Healthy Sister   . Healthy Brother   . Hypertension Maternal Uncle   . Heart attack Maternal Uncle     Social History Social History   Tobacco Use  . Smoking status: Never Smoker  . Smokeless tobacco: Never Used  Substance Use Topics  . Alcohol use: No  . Drug use: Never     Allergies   Mucinex [guaifenesin er] and Aspirin   Review of  Systems Review of Systems  Constitutional: Positive for activity change. Negative for appetite change, chills, fatigue and fever.  HENT: Positive for congestion. Negative for postnasal drip, rhinorrhea, sinus pressure and sinus pain.   Respiratory: Positive for cough, shortness of breath and wheezing.   All other systems reviewed and are negative.    Physical Exam Triage Vital Signs ED Triage Vitals  Enc Vitals Group     BP 12/10/18 1027 133/90     Pulse Rate 12/10/18 1027 85     Resp 12/10/18 1027 18     Temp 12/10/18 1027 98.5 F (36.9 C)     Temp Source 12/10/18 1027 Oral     SpO2 12/10/18 1027 99 %     Weight 12/10/18 1025 190 lb (86.2 kg)     Height 12/10/18 1025 5\' 10"  (1.778 m)     Head Circumference --      Peak Flow --      Pain Score 12/10/18 1025 0     Pain Loc --      Pain Edu? --      Excl. in GC? --    No data found.  Updated Vital Signs BP 133/90 (BP Location: Right Arm)   Pulse 85  Temp 98.5 F (36.9 C) (Oral)   Resp 18   Ht 5\' 10"  (1.778 m)   Wt 190 lb (86.2 kg)   SpO2 99%   BMI 27.26 kg/m   Visual Acuity Right Eye Distance:   Left Eye Distance:   Bilateral Distance:    Right Eye Near:   Left Eye Near:    Bilateral Near:     Physical Exam Vitals signs and nursing note reviewed.  Constitutional:      General: He is not in acute distress.    Appearance: Normal appearance. He is not ill-appearing, toxic-appearing or diaphoretic.  HENT:     Head: Normocephalic.     Right Ear: Tympanic membrane, ear canal and external ear normal.     Left Ear: Tympanic membrane and ear canal normal.     Nose: Nose normal. No congestion or rhinorrhea.     Mouth/Throat:     Mouth: Mucous membranes are moist.     Pharynx: Oropharynx is clear. No oropharyngeal exudate or posterior oropharyngeal erythema.  Eyes:     General:        Right eye: No discharge.        Left eye: No discharge.     Conjunctiva/sclera: Conjunctivae normal.     Pupils: Pupils are  equal, round, and reactive to light.  Neck:     Musculoskeletal: Normal range of motion and neck supple.  Pulmonary:     Effort: Pulmonary effort is normal.     Breath sounds: Normal breath sounds.  Musculoskeletal: Normal range of motion.  Skin:    General: Skin is warm and dry.  Neurological:     General: No focal deficit present.     Mental Status: He is alert and oriented to person, place, and time.  Psychiatric:        Mood and Affect: Mood normal.        Behavior: Behavior normal.        Thought Content: Thought content normal.        Judgment: Judgment normal.      UC Treatments / Results  Labs (all labs ordered are listed, but only abnormal results are displayed) Labs Reviewed - No data to display  EKG None  Radiology No results found.  Procedures Procedures (including critical care time)  Medications Ordered in UC Medications - No data to display  Initial Impression / Assessment and Plan / UC Course  I have reviewed the triage vital signs and the nursing notes.  Pertinent labs & imaging results that were available during my care of the patient were reviewed by me and considered in my medical decision making (see chart for deta  Has had a cough for 3 weeks.  Signs and his physical exam are relatively normal.  Him symptomatically.  We will also give him a prescription for prednisone because of the irritation  focus for his coughing.  If he is not improving in 2 weeks I recommend he return for a follow-up with possible x-ray     Final Clinical Impressions(s) / UC Diagnoses   Final diagnoses:  Bronchitis     Discharge Instructions     Cool mist vaporizer at nighttime.  Not improved in 2 weeks return to our clinic for consideration of an x-ray.    ED Prescriptions    Medication Sig Dispense Auth. Provider   chlorpheniramine-HYDROcodone (TUSSIONEX PENNKINETIC ER) 10-8 MG/5ML SUER Take 5 mLs by mouth 2 (two) times daily. 115 mL Lutricia Feil, PA-C  benzonatate (TESSALON) 200 MG capsule Take one cap TID PRN cough 30 capsule Ovid Curd P, PA-C   predniSONE (DELTASONE) 20 MG tablet Take 2 tablets (40 mg) daily by mouth 8 tablet Lutricia Feil, PA-C     Controlled Substance Prescriptions [place] Controlled Substance Registry consulted? Not Applicable   Lutricia Feil, PA-C 12/10/18 1116

## 2018-12-10 NOTE — ED Triage Notes (Signed)
Patient c/o cough and congestion that started 3 weeks ago. Patient has taken OTC Robitussin, Dayquil, Nyquil with no relief. Denies fever.

## 2018-12-10 NOTE — Discharge Instructions (Signed)
Cool mist vaporizer at nighttime.  Not improved in 2 weeks return to our clinic for consideration of an x-ray.

## 2018-12-22 ENCOUNTER — Emergency Department
Admission: EM | Admit: 2018-12-22 | Discharge: 2018-12-22 | Disposition: A | Discharge status: Home / Self Care | Payer: No Typology Code available for payment source | Attending: Emergency Medicine | Admitting: Emergency Medicine

## 2018-12-22 ENCOUNTER — Other Ambulatory Visit: Payer: Self-pay

## 2018-12-22 ENCOUNTER — Emergency Department: Payer: No Typology Code available for payment source

## 2018-12-22 DIAGNOSIS — Z79899 Other long term (current) drug therapy: Secondary | ICD-10-CM | POA: Diagnosis not present

## 2018-12-22 DIAGNOSIS — R059 Cough, unspecified: Secondary | ICD-10-CM

## 2018-12-22 DIAGNOSIS — R05 Cough: Secondary | ICD-10-CM | POA: Insufficient documentation

## 2018-12-22 DIAGNOSIS — J40 Bronchitis, not specified as acute or chronic: Secondary | ICD-10-CM | POA: Diagnosis not present

## 2018-12-22 DIAGNOSIS — R079 Chest pain, unspecified: Secondary | ICD-10-CM | POA: Diagnosis present

## 2018-12-22 LAB — CBC
HEMATOCRIT: 46.8 % (ref 39.0–52.0)
HEMOGLOBIN: 15.5 g/dL (ref 13.0–17.0)
MCH: 28.7 pg (ref 26.0–34.0)
MCHC: 33.1 g/dL (ref 30.0–36.0)
MCV: 86.7 fL (ref 80.0–100.0)
Platelets: 265 10*3/uL (ref 150–400)
RBC: 5.4 MIL/uL (ref 4.22–5.81)
RDW: 12.8 % (ref 11.5–15.5)
WBC: 9.8 10*3/uL (ref 4.0–10.5)
nRBC: 0 % (ref 0.0–0.2)

## 2018-12-22 LAB — COMPREHENSIVE METABOLIC PANEL
ALT: 27 U/L (ref 0–44)
AST: 19 U/L (ref 15–41)
Albumin: 3.8 g/dL (ref 3.5–5.0)
Alkaline Phosphatase: 71 U/L (ref 38–126)
Anion gap: 8 (ref 5–15)
BILIRUBIN TOTAL: 0.4 mg/dL (ref 0.3–1.2)
BUN: 19 mg/dL (ref 6–20)
CO2: 24 mmol/L (ref 22–32)
Calcium: 8.8 mg/dL — ABNORMAL LOW (ref 8.9–10.3)
Chloride: 106 mmol/L (ref 98–111)
Creatinine, Ser: 0.82 mg/dL (ref 0.61–1.24)
GFR calc Af Amer: >60 mL/min (ref >60)
GFR calc non Af Amer: >60 mL/min (ref >60)
Glucose, Bld: 123 mg/dL — ABNORMAL HIGH (ref 70–99)
Potassium: 3.9 mmol/L (ref 3.5–5.1)
Sodium: 138 mmol/L (ref 135–145)
Total Protein: 7.3 g/dL (ref 6.5–8.1)

## 2018-12-22 LAB — TROPONIN I: Troponin I: <0.03 ng/mL (ref <0.03)

## 2018-12-22 MED ORDER — HYDROCODONE-HOMATROPINE 5-1.5 MG/5ML PO SYRP: 5.0000 mL | ORAL_SOLUTION | Freq: Four times a day (QID) | ORAL | 0 refills | Status: DC | PRN

## 2018-12-22 MED ORDER — AZITHROMYCIN 250 MG PO TABS: ORAL_TABLET | ORAL | 0 refills | Status: DC

## 2018-12-22 NOTE — ED Triage Notes (Signed)
Pt in with co cough x 3 weeks states greenish at times. Pt denies a fever, states has chills at night. STates does have chest tightness all the time and pain at times.

## 2018-12-22 NOTE — Discharge Instructions (Signed)
As we discussed, your evaluation was reassuring today with no obvious sign of infection or emergent illness.  Given that you have been suffering from this cough for an extended period of time, I agreed to give you a course of antibiotics.  Please take the full course of treatment (5 days) as written on the label instructions.  Your electronic medical record indicates that this would be the fourth prescription for cough syrup that she had received in the last 6 months; we are trying some antibiotics at this time but you should follow-up with your primary care doctor in about a week to discuss additional management options and recommendations.  I also suggest that you try taking a Zyrtec (cetirizine) over-the-counter antihistamine in case your symptoms are at least partially caused by allergies.  You were prescribed Tessalon Perles a few days ago and can continue taking those as needed during the day for cough suppression.    Return to the emergency department if you develop new or worsening symptoms that concern you.

## 2018-12-22 NOTE — ED Notes (Signed)
Pt to the er for cough x 3 weeks. Pt is taking OTC meds for cough with no relief. Pt reports sweating at night but no fever. Pt denies sinus drainge at this time. Pt has a hx of bronchitis. Lungs are clear bilaterally. Pt reports coughing up some green phlegm.

## 2018-12-22 NOTE — ED Provider Notes (Signed)
[facility] Emergency Department Provider Note  ____________________________________________   First MD Initiated Contact with Patient 12/22/18 [phone]     (approximate)  I have reviewed the triage vital signs and the nursing notes.   HISTORY  Chief Complaint Chest Pain and Cough    HPI Adam Cannon is a 53 y.o. male who denies any chronic medical history and presents for evaluation of a persistent cough that is been going on for about 3 weeks.  It is occasionally productive of green sputum.  He reports that it started 3 to 4 weeks ago as a sore throat and developed into this cough that he has not been able to get rid of.  Nothing in particular makes it better or worse but his cough seems to be worse at night.  He denies fever/chills although at the start of the process he had some subjective fever.  He denies shortness of breath.  He has had some intermittent chest pain off and on for the last few weeks.  He denies abdominal pain, nausea, vomiting.  He says he is able to workout every day with a cough is really bothering him.  He describes it as severe.  He did not mention it, but the medical record indicates that he has had multiple visits to urgent cares.  He completed a course of prednisone but I do not see a record that he has had antibiotics.  He denies smoking and drug use.         Past Medical History:  Diagnosis Date  . Environmental and seasonal allergies     There are no active problems to display for this patient.   Past Surgical History:  Procedure Laterality Date  . NO PAST SURGERIES      Prior to Admission medications   Medication Sig Start Date End Date Taking? Authorizing Provider  azithromycin (ZITHROMAX) 250 MG tablet Take 2 tablets PO on day 1, then take 1 tablet PO daily for 4 more days 12/22/18   Loleta Rose, MD  benzonatate (TESSALON) 200 MG capsule Take one cap TID PRN cough 12/10/18   Lutricia Feil, PA-C    chlorpheniramine-HYDROcodone [facility] ER) 10-8 MG/5ML SUER Take 5 mLs by mouth 2 (two) times daily. 12/10/18   Lutricia Feil, PA-C  HYDROcodone-homatropine (HYCODAN) 5-1.5 MG/5ML syrup Take 5 mLs by mouth every 6 (six) hours as needed for cough. 12/22/18   Loleta Rose, MD  predniSONE (DELTASONE) 20 MG tablet Take 2 tablets (40 mg) daily by mouth 12/10/18   Lutricia Feil, PA-C    Allergies Mucinex [guaifenesin er] and Aspirin  Family History  Problem Relation Age of Onset  . Hypertension Mother   . Breast cancer Mother   . Hypertension Father   . Healthy Sister   . Healthy Brother   . Hypertension Maternal Uncle   . Heart attack Maternal Uncle     Social History Social History   Tobacco Use  . Smoking status: Never Smoker  . Smokeless tobacco: Never Used  Substance Use Topics  . Alcohol use: No  . Drug use: Never    Review of Systems Constitutional: No fever/chills Eyes: No visual changes. ENT: No sore throat. Cardiovascular: Occasional chest pain for the last couple of weeks, none currently. Respiratory: Persistent severe cough as described above.  Denies shortness of breath. Gastrointestinal: No abdominal pain.  No nausea, no vomiting.  No diarrhea.  No constipation. Genitourinary: Negative for dysuria. Musculoskeletal: Negative for neck pain.  Negative for back pain. Integumentary: Negative for rash. Neurological: Negative for headaches, focal weakness or numbness.   ____________________________________________   PHYSICAL EXAM:  VITAL SIGNS: ED Triage Vitals [12/22/18 0111]  Enc Vitals Group     BP      Pulse      Resp      Temp      Temp src      SpO2      Weight 87.1 kg (192 lb)     Height 1.778 m ( )     Head Circumference      Peak Flow      Pain Score 8     Pain Loc      Pain Edu?      Excl. in GC?     Constitutional: Alert and oriented. Well appearing and in no acute distress. Eyes: Conjunctivae are normal.  Head:  Atraumatic. Nose: No congestion/rhinnorhea. Mouth/Throat: Mucous membranes are moist. Neck: No stridor.  No meningeal signs.   Cardiovascular: Normal rate, regular rhythm. Good peripheral circulation. Grossly normal heart sounds. Respiratory: Normal respiratory effort.  No retractions. Lungs CTAB. Gastrointestinal: Soft and nontender. No distention.  Musculoskeletal: No lower extremity tenderness nor edema. No gross deformities of extremities. Neurologic:  Normal speech and language. No gross focal neurologic deficits are appreciated.  Skin:  Skin is warm, dry and intact. No rash noted. Psychiatric: Mood and affect are normal. Speech and behavior are normal.  ____________________________________________   LABS (all labs ordered are listed, but only abnormal results are displayed)  Labs Reviewed  COMPREHENSIVE METABOLIC PANEL - Abnormal; Notable for the following components:      Result Value   Glucose, Bld 123 (*)    Calcium 8.8 (*)    All other components within normal limits  CBC  TROPONIN I   ____________________________________________  EKG  ED ECG REPORT I, Loleta Rose, the attending physician, personally viewed and interpreted this ECG.  Date: 12/22/2018 EKG Time: 1:12 Rate: 87 Rhythm: normal sinus rhythm QRS Axis: normal Intervals: normal ST/T Wave abnormalities: normal Narrative Interpretation: no evidence of acute ischemia.  Baseline wander.  ____________________________________________  RADIOLOGY Marylou Mccoy, personally viewed and evaluated these images (plain radiographs) as part of my medical decision making, as well as reviewing the written report by the radiologist.  ED MD interpretation:  No acute abnormalities  Official radiology report(s): Dg Chest 2 View  Result Date: 12/22/2018 CLINICAL DATA:  Chest pain EXAM: CHEST - 2 VIEW COMPARISON:  07/04/2015 FINDINGS: The heart size and mediastinal contours are within normal limits. Both lungs are  clear. The visualized skeletal structures are unremarkable. IMPRESSION: No active cardiopulmonary disease. Electronically Signed   By: Jasmine Pang M.D.   On: 12/22/2018 01:48    ____________________________________________   PROCEDURES   Procedure(s) performed (including Critical Care):  Procedures   ____________________________________________   INITIAL IMPRESSION / MDM / ASSESSMENT AND PLAN / ED COURSE  As part of my medical decision making, I reviewed the following data within the electronic MEDICAL RECORD NUMBER Nursing notes reviewed and incorporated, Labs reviewed , EKG interpreted , Old chart reviewed, Radiograph reviewed , Notes from prior ED visits and [place] Controlled Substance Database         Differential diagnosis includes, but is not limited to, viral bronchitis, ACE inhibitor cough, acid reflux, pneumonia, other nonspecific viral illness, psychogenic cough.  The patient is well-appearing and in no distress.  He reports his symptoms have been going on for about 3 weeks but  a review of the [place] controlled substance database indicates that he is gotten multiple prescriptions for cough medicine over the last year.  He has clear lungs with no wheezing and reports no tobacco use history.  He is obviously very anxious about the cough and concerned that he is developing pneumonia.  I see that he has had multiple urgent care visits but I do not see that he has been on antibiotics although he has had a course of prednisone.  I truly believe that the patient is concerned about his cough but I do not have a specific diagnosis at this time.  I explained to him that even though I typically do not prescribe empiric antibiotics for what appears to be a viral bronchitis, we will try a Z-Pak this time.  I will give him another prescription for cough medicine to help at night.  He already has [employer] that were prescribed a few days ago.  Also recommend that he try taking a  cetirizine daily which may help with his symptoms are at least in part allergic.  He understands and agrees with the plan will follow-up with his outpatient doctor in about a week.  I gave my usual customary return precautions.     ____________________________________________  FINAL CLINICAL IMPRESSION(S) / ED DIAGNOSES  Final diagnoses:  Cough  Bronchitis     MEDICATIONS GIVEN DURING THIS VISIT:  Medications - No data to display   ED Discharge Orders         Ordered    azithromycin (ZITHROMAX) 250 MG tablet     12/22/18 0533    HYDROcodone-homatropine (HYCODAN) 5-1.5 MG/5ML syrup  Every 6 hours PRN     12/22/18 0533           Note:  This document was prepared using Dragon voice recognition software and may include unintentional dictation errors.   Loleta Rose, MD 12/22/18 [phone]

## 2019-01-10 ENCOUNTER — Encounter: Payer: No Typology Code available for payment source | Admitting: Nurse Practitioner

## 2019-01-24 ENCOUNTER — Encounter: Payer: Self-pay | Admitting: Nurse Practitioner

## 2019-01-24 ENCOUNTER — Ambulatory Visit (INDEPENDENT_AMBULATORY_CARE_PROVIDER_SITE_OTHER): Payer: No Typology Code available for payment source | Admitting: Nurse Practitioner

## 2019-01-24 ENCOUNTER — Other Ambulatory Visit: Payer: Self-pay

## 2019-01-24 VITALS — BP 116/76 | HR 74 | Temp 98.6°F | Ht 70.0 in | Wt 190.2 lb

## 2019-01-24 DIAGNOSIS — I861 Scrotal varices: Secondary | ICD-10-CM | POA: Diagnosis not present

## 2019-01-24 NOTE — Patient Instructions (Addendum)
Adam Cannon,   Thank you for coming in to clinic today.  1. Aleve two tablets (440 mg) twice daily.  2. Apply heat and ice as needed for 15 minutes 2-3 times daily or when aching more.  3. Continue wearing supportive briefs.  4. Limit high intensity exercises, bike riding.   Please schedule a follow-up appointment with Wilhelmina Mcardle, AGNP. Return 2 weeks if symptoms worsen or fail to improve.  If you have any other questions or concerns, please feel free to call the clinic or send a message through MyChart. You may also schedule an earlier appointment if necessary.  You will receive a survey after today's visit either digitally by e-mail or paper by [employer]. Your experiences and feedback matter to [place].  Please respond so we know how we are doing as we provide care for you.   Wilhelmina Mcardle, DNP, AGNP-BC Adult Gerontology Nurse Practitioner [facility], [facility]   Varicocele  A varicocele is a swelling of veins in the scrotum. The scrotum is the sac that contains the testicles. Varicoceles can occur on either side of the scrotum, but they are more common on the left side. They occur most often in teenage boys and young men. In most cases, varicoceles are not a serious problem. They are usually small and painless and do not require treatment. Tests may be done to confirm the diagnosis. Treatment may be needed if:  A varicocele is large, causes a lot of pain, or causes pain when exercising.  Varicoceles are found on both sides of the scrotum.  A varicocele causes a decrease in the size of the testicle in a growing adolescent.  The person has fertility problems. What are the causes? This condition is the result of valves in the veins not working properly. Valves in the veins help to return blood from the scrotum and testicles to the heart. If these valves do not work well, blood flows backward and backs up into the veins, which causes the veins to swell. This is  similar to what happens when varicose veins form in the leg. What are the signs or symptoms? Most varicoceles do not cause any symptoms. If symptoms do occur, they may include:  Swelling on one side of the scrotum. The swelling may be more obvious when you are standing up.  A lumpy feeling in the scrotum.  A heavy feeling on one side of the scrotum.  A dull ache in the scrotum, especially after exercise or prolonged standing or sitting.  Slower growth or reduced size of the testicle on the side of the varicocele (in young males).  Problems with fertility. This can occur if the testicle does not grow normally. How is this diagnosed? This condition may be diagnosed with a physical exam. You may also have an imaging test called an ultrasound to confirm the diagnosis and to help rule out other causes of the swelling. How is this treated? Treatment is usually not needed for this condition. If you have any pain, your health care provider may prescribe or recommend medicine to help relieve it. You may need regular exams so your health care provider can monitor the varicocele to ensure that it does not cause problems. When further treatment is needed, it may involve one of these options:  Varicocelectomy. This is a surgery in which the swollen veins are tied off so that the flow of blood goes to other veins instead.  Embolization. In this procedure, a small tube (catheter)  is used to place metal coils or other blocking items in the veins. This cuts off the blood flow to the swollen veins. Follow these instructions at home:  Take over-the-counter and prescription medicines only as told by your health care provider.  Wear supportive underwear.  Use an athletic supporter when participating in sports activities.  Keep all follow-up visits as told by your health care provider. This is important. Contact a health care provider if:  Your pain is increasing.  You have redness in the affected  area.  Your testicle becomes enlarged, swollen, or painful.  You have swelling that does not decrease when you are lying down.  One of your testicles is smaller than the other. Summary  Varicocele is a condition in which the veins in the scrotum are swollen or enlarged.  In most cases, varicoceles do not require treatment.  Treatment may be needed if you have pain, have problems with infertility, or have a smaller testicle associated with the varicocele.  In some cases, the condition may be treated with a procedure to cut off the flow of blood to the swollen veins. This information is not intended to replace advice given to you by your health care provider. Make sure you discuss any questions you have with your health care provider. Document Released: 01/12/2001 Document Revised: 07/22/2017 Document Reviewed: 07/22/2017 Elsevier Interactive Patient Education  2019 [employer].

## 2019-01-24 NOTE — Progress Notes (Signed)
Subjective:    Patient ID: Adam Cannon, male    DOB: 1966-04-18, 53 y.o.   MRN: [id number]  Adam Cannon is a 53 y.o. male presenting on 01/24/2019 for Groin Injury (pt state he was hit in the groin area x 2 weeks ago with a basketball. The pain subsided shortly after the injury, but then returned 2 days later and subsided. Now yesterday 2 days after the injury the pain returned and been persistent.)  HPI LEFT testicular/groin pain No pain like this prior.  2 weeks ago, had basketball hit him in groin.  Left testicle had immediate pain. 2 days later had mild soreness but resolved.  Again had pain return yesterday with aching. - Bayer aspirin, Aleve is not helping pain.  Patient always wears supportive briefs/boxers.  Social History   Tobacco Use  . Smoking status: Never Smoker  . Smokeless tobacco: Never Used  Substance Use Topics  . Alcohol use: No  . Drug use: Never    Review of Systems Per HPI unless specifically indicated above     Objective:    BP 116/76   Pulse 74   Temp 98.6 F (37 C) (Oral)   Ht 5\' 10"  (1.778 m)   Wt 190 lb 3.2 oz (86.3 kg)   BMI 27.29 kg/m   Wt Readings from Last 3 Encounters:  01/24/19 190 lb 3.2 oz (86.3 kg)  12/22/18 192 lb (87.1 kg)  12/10/18 190 lb (86.2 kg)    Physical Exam Vitals signs reviewed.  Constitutional:      General: He is not in acute distress.    Appearance: He is well-developed and normal weight.  HENT:     Head: Normocephalic and atraumatic.  Cardiovascular:     Rate and Rhythm: Normal rate and regular rhythm.     Pulses: Normal pulses.     Heart sounds: Normal heart sounds.  Pulmonary:     Effort: Pulmonary effort is normal.     Breath sounds: Normal breath sounds.  Genitourinary:    Comments: Genital Exam chaperoned by Elvina Mattes, CMA  Genital: penis normal shape without lesions or urethral discharge, scrotum intact without masses, spermatic cords palpated on RIGHT without edema or tenderness,epididymis  normal without swelling or tenderness bilateral testicles descended equal in size and RIGHT non-tender to palpation.  No inguinal hernia or lymphadenopathy.  LEFT spermatic cords palpated with mild edema and tenderness. Compressible venous bulging noted near top of LEFT testicle and including spermatic cords. LEFT testicle mildly tender at anterosuperior aspect.  Testicle oriented vertically.  Normal cremasteric reflex. Skin:    General: Skin is warm and dry.     Capillary Refill: Capillary refill takes less than 2 seconds.  Neurological:     General: No focal deficit present.     Mental Status: He is alert and oriented to person, place, and time. Mental status is at baseline.  Psychiatric:        Mood and Affect: Mood normal.        Behavior: Behavior normal.        Thought Content: Thought content normal.        Judgment: Judgment normal.      Results for orders placed or performed during the hospital encounter of 12/22/18  CBC  Result Value Ref Range   WBC 9.8 4.0 - 10.5 K/uL   RBC 5.40 4.22 - 5.81 MIL/uL   Hemoglobin 15.5 13.0 - 17.0 g/dL   HCT 27.7 82.4 - 23.5 %  MCV 86.7 80.0 - 100.0 fL   MCH 28.7 26.0 - 34.0 pg   MCHC 33.1 30.0 - 36.0 g/dL   RDW 45.412.8 09.811.5 - 11.915.5 %   Platelets 265 150 - 400 K/uL   nRBC 0.0 0.0 - 0.2 %  Comprehensive metabolic panel  Result Value Ref Range   Sodium 138 135 - 145 mmol/L   Potassium 3.9 3.5 - 5.1 mmol/L   Chloride 106 98 - 111 mmol/L   CO2 24 22 - 32 mmol/L   Glucose, Bld 123 (H) 70 - 99 mg/dL   BUN 19 6 - 20 mg/dL   Creatinine, Ser 1.470.82 0.61 - 1.24 mg/dL   Calcium 8.8 (L) 8.9 - 10.3 mg/dL   Total Protein 7.3 6.5 - 8.1 g/dL   Albumin 3.8 3.5 - 5.0 g/dL   AST 19 15 - 41 U/L   ALT 27 0 - 44 U/L   Alkaline Phosphatase 71 38 - 126 U/L   Total Bilirubin 0.4 0.3 - 1.2 mg/dL   GFR calc non Af Amer >60 >60 mL/min   GFR calc Af Amer >60 >60 mL/min   Anion gap 8 5 - 15  Troponin I - ONCE - STAT  Result Value Ref Range   Troponin I <0.03  <0.03 ng/mL      Assessment & Plan:   Problem List Items Addressed This Visit    None    Visit Diagnoses    Left varicocele    -  Primary     On exam, patient identified to have new left varicocele after left testicular trauma 2 weeks ago.  Pain was always described as general aching and there is no current late sign of testicular torsion at time of original injury.  No current evidence of testicular emergency on exam.  Plan: 1. Conservative treatment for next 2 weeks given inadequate conservative therapy in last 2 weeks. - Wear supportive briefs. - Apply heat and/or ice prn 15 mins 2-3 times daily - Limit high intensity exercises, anything that increases scrotal movement/vibration like motorcycle riding - START Aleeve 2 tablets (440 mg) twice daily. 2. May need scrotal ultrasound/referral to Urology in future if pain is not resolving.  Patient to call clinic in 2 weeks. 3. FOLLOW-UP 2 weeks prn.   Follow up plan: Return 2 weeks if symptoms worsen or fail to improve.  Wilhelmina McardleLauren Saafir Abdullah, DNP, AGPCNP-BC Adult Gerontology Primary Care Nurse Practitioner [facility] 01/24/2019, 11:02 AM

## 2019-06-06 ENCOUNTER — Other Ambulatory Visit: Payer: Self-pay

## 2019-06-06 ENCOUNTER — Ambulatory Visit (INDEPENDENT_AMBULATORY_CARE_PROVIDER_SITE_OTHER): Payer: No Typology Code available for payment source | Admitting: Nurse Practitioner

## 2019-06-06 VITALS — BP 131/83 | Ht 70.0 in | Wt 193.8 lb

## 2019-06-06 DIAGNOSIS — I861 Scrotal varices: Secondary | ICD-10-CM

## 2019-06-06 MED ORDER — DICLOFENAC SODIUM 75 MG PO TBEC: 75.0000 mg | DELAYED_RELEASE_TABLET | Freq: Two times a day (BID) | ORAL | 0 refills | Status: DC

## 2019-06-06 NOTE — Patient Instructions (Addendum)
Adam Cannon,   Thank you for coming in to clinic today.  1. Urology will call you to schedule an appointment. They will confirm that a varicocele is present or not and offer other treatment options.  Please schedule a follow-up appointment with Cassell Smiles, AGNP. Return if symptoms worsen or fail to improve.  If you have any other questions or concerns, please feel free to call the clinic or send a message through [place]. You may also schedule an earlier appointment if necessary.  You will receive a survey after today's visit either digitally by e-mail or paper by [employer]. Your experiences and feedback matter to [place].  Please respond so we know how we are doing as we provide care for you.   Cassell Smiles, DNP, AGNP-BC Adult Gerontology Nurse Practitioner [place]

## 2019-06-06 NOTE — Progress Notes (Signed)
Subjective:    Patient ID: Adam Cannon, male    DOB: 11/29/1965, 53 y.o.   MRN: [id number]  Adam Cannon is a 53 y.o. male presenting on 06/06/2019 for Groin Pain (intermittent groin pain. Pt state that he felt like the pain was improving at one time, but then it returned. Some days are worse then other.)  HPI LEFT groin pain Initial onset early April 2020 after trauma to the scrotal area with interval improvement.  Patient notes he has had no pain some days.  Now pain has returned about 3 weeks ago without known trigger and patient desires re-examination/next steps.  - Aleve still helps pain minimally. - Patient notes no ongoing lump/swelling.  - Patient stays on feet as a barber, nearly constant movement - Continues wearing supportive boxer briefs  Social History   Tobacco Use  . Smoking status: Never Smoker  . Smokeless tobacco: Never Used  Substance Use Topics  . Alcohol use: No  . Drug use: Never    Review of Systems Per HPI unless specifically indicated above     Objective:    BP 131/83 (BP Location: Left Arm, Patient Position: Sitting, Cuff Size: Large)   Ht 5\' 10"  (1.778 m)   Wt 193 lb 12.8 oz (87.9 kg)   BMI 27.81 kg/m   Wt Readings from Last 3 Encounters:  06/06/19 193 lb 12.8 oz (87.9 kg)  01/24/19 190 lb 3.2 oz (86.3 kg)  12/22/18 192 lb (87.1 kg)    Physical Exam Vitals signs reviewed.  Constitutional:      General: He is not in acute distress.    Appearance: He is well-developed.  HENT:     Head: Normocephalic and atraumatic.  Cardiovascular:     Rate and Rhythm: Normal rate and regular rhythm.     Pulses:          Radial pulses are 2+ on the right side and 2+ on the left side.       Posterior tibial pulses are 1+ on the right side and 1+ on the left side.     Heart sounds: Normal heart sounds, S1 normal and S2 normal.  Pulmonary:     Effort: Pulmonary effort is normal. No respiratory distress.     Breath sounds: Normal breath sounds and air  entry.  Genitourinary:    Comments: Genital and Rectal Exam chaperoned by Frederich Cha, CMA Genital: circumcised penis normal shape without lesions or urethral discharge, scrotum intact without masses, LEFT sided spermatic cords palpated with enlargement near top of testicle with tenderness.  Epididymis normal without swelling or tenderness.  Bilateral testicles descended equal in size and non-tender to palpation.  No inguinal hernia or lymphadenopathy. Musculoskeletal:     Right lower leg: No edema.     Left lower leg: No edema.  Skin:    General: Skin is warm and dry.     Capillary Refill: Capillary refill takes less than 2 seconds.  Neurological:     Mental Status: He is alert and oriented to person, place, and time.  Psychiatric:        Attention and Perception: Attention normal.        Mood and Affect: Mood and affect normal.        Behavior: Behavior normal. Behavior is cooperative.    Results for orders placed or performed during the hospital encounter of 12/22/18  CBC  Result Value Ref Range   WBC 9.8 4.0 - 10.5 K/uL   RBC  5.40 4.22 - 5.81 MIL/uL   Hemoglobin 15.5 13.0 - 17.0 g/dL   HCT 40.946.8 81.139.0 - 91.452.0 %   MCV 86.7 80.0 - 100.0 fL   MCH 28.7 26.0 - 34.0 pg   MCHC 33.1 30.0 - 36.0 g/dL   RDW 78.212.8 95.611.5 - 21.315.5 %   Platelets 265 150 - 400 K/uL   nRBC 0.0 0.0 - 0.2 %  Comprehensive metabolic panel  Result Value Ref Range   Sodium 138 135 - 145 mmol/L   Potassium 3.9 3.5 - 5.1 mmol/L   Chloride 106 98 - 111 mmol/L   CO2 24 22 - 32 mmol/L   Glucose, Bld 123 (H) 70 - 99 mg/dL   BUN 19 6 - 20 mg/dL   Creatinine, Ser 0.860.82 0.61 - 1.24 mg/dL   Calcium 8.8 (L) 8.9 - 10.3 mg/dL   Total Protein 7.3 6.5 - 8.1 g/dL   Albumin 3.8 3.5 - 5.0 g/dL   AST 19 15 - 41 U/L   ALT 27 0 - 44 U/L   Alkaline Phosphatase 71 38 - 126 U/L   Total Bilirubin 0.4 0.3 - 1.2 mg/dL   GFR calc non Af Amer >60 >60 mL/min   GFR calc Af Amer >60 >60 mL/min   Anion gap 8 5 - 15  Troponin I - ONCE -  STAT  Result Value Ref Range   Troponin I <0.03 <0.03 ng/mL      Assessment & Plan:   Problem List Items Addressed This Visit    None    Visit Diagnoses    Left varicocele    -  Primary   Relevant Medications   diclofenac (VOLTAREN) 75 MG EC tablet   Other Relevant Orders   Ambulatory referral to Urology    Likely persistent varicocele with adequate conservative therapy to date.  No known trigger for return of pain.  Plan: 1. Referral urology.  Evaluate anatomy likely with US to verify diagnosis. 2. May start diclofenac 75 mg bid x 14 days.  STOP all other OTC NSAIDs 3. Follow-up with PCP prn,  Call if no urology appointment made in next 7 days for referral status.   Meds ordered this encounter  Medications  . diclofenac (VOLTAREN) 75 MG EC tablet    Sig: Take 1 tablet (75 mg total) by mouth 2 (two) times daily.    Dispense:  30 tablet    Refill:  0    Order Specific Question:   Supervising Provider    Answer:   Smitty CordsKARAMALEGOS, ALEXANDER J [[id number]]   Follow up plan: Return if symptoms worsen or fail to improve.  Wilhelmina McardleLauren Ekaterina Denise, DNP, AGPCNP-BC Adult Gerontology Primary Care Nurse Practitioner [facility] 06/06/2019, 10:59 AM

## 2019-06-07 ENCOUNTER — Encounter: Payer: Self-pay | Admitting: Nurse Practitioner

## 2019-07-12 ENCOUNTER — Ambulatory Visit: Payer: No Typology Code available for payment source | Admitting: Urology

## 2019-07-13 ENCOUNTER — Ambulatory Visit: Payer: No Typology Code available for payment source | Admitting: Urology

## 2019-08-31 ENCOUNTER — Ambulatory Visit: Payer: No Typology Code available for payment source | Admitting: Urology

## 2019-12-15 ENCOUNTER — Encounter: Payer: Self-pay | Admitting: Family Medicine

## 2019-12-15 ENCOUNTER — Other Ambulatory Visit: Payer: Self-pay

## 2019-12-15 ENCOUNTER — Ambulatory Visit (INDEPENDENT_AMBULATORY_CARE_PROVIDER_SITE_OTHER): Payer: No Typology Code available for payment source | Admitting: Family Medicine

## 2019-12-15 DIAGNOSIS — H6983 Other specified disorders of Eustachian tube, bilateral: Secondary | ICD-10-CM

## 2019-12-15 DIAGNOSIS — H6993 Unspecified Eustachian tube disorder, bilateral: Secondary | ICD-10-CM | POA: Insufficient documentation

## 2019-12-15 MED ORDER — FLUTICASONE PROPIONATE 50 MCG/ACT NA SUSP: 2.0000 | Freq: Every day | NASAL | 6 refills | Status: DC

## 2019-12-15 MED ORDER — GUAIFENESIN ER 1200 MG PO TB12: 1.0000 | ORAL_TABLET | Freq: Two times a day (BID) | ORAL | 0 refills | Status: AC

## 2019-12-15 NOTE — Progress Notes (Signed)
I have reviewed this encounter including the documentation in this note and/or discussed this patient with the provider, Danielle Rankin FNP. I am certifying that I agree with the content of this note as supervising physician.  Saralyn Pilar, DO [facility] 12/15/2019, 3:53 PM

## 2019-12-15 NOTE — Progress Notes (Signed)
Virtual Visit via Telephone The purpose of this virtual visit is to provide medical care while limiting exposure to the novel coronavirus (COVID19) for both patient and office staff.  Consent was obtained for phone visit:  Yes.   Answered questions that patient had about telehealth interaction:  Yes.   I discussed the limitations, risks, security and privacy concerns of performing an evaluation and management service by telephone. I also discussed with the patient that there may be a patient responsible charge related to this service. The patient expressed understanding and agreed to proceed.  Patient Location: Home Provider Location: [facility])  ---------------------------------------------------------------------- Chief Complaint  Patient presents with  . Ear Pain    back side of right ear. Pt has allergies and runny nose. pain since sunday and some relief when taking claritin    S: Reviewed CMA documentation. I have called patient and gathered additional HPI as follows:  Mr. Pesta reports that on Sunday he began to have runny nose on Sunday with pain on the inner side of his ears.  Has been blowing clear mucus out of his nose since Sunday.  Has a history of seasonal allergies.  Denies fevers, sore throat, PND, change in taste/smell, pain in teeth or sinuses, cough, SOB, nausea.  Has been taking over the counter Claritin D with some relief in his symptoms.  Patient is currently home Denies any high risk travel to areas of current concern for COVID19. Denies any known or suspected exposure to person with or possibly with COVID19.  Past Medical History:  Diagnosis Date  . Environmental and seasonal allergies    Social History   Tobacco Use  . Smoking status: Never Smoker  . Smokeless tobacco: Never Used  Substance Use Topics  . Alcohol use: No  . Drug use: Never    Current Outpatient Medications:  .  naproxen sodium (ALEVE) 220 MG tablet, Take 220 mg  by mouth., Disp: , Rfl:  .  diclofenac (VOLTAREN) 75 MG EC tablet, Take 1 tablet (75 mg total) by mouth 2 (two) times daily. (Patient not taking: Reported on 12/15/2019), Disp: 30 tablet, Rfl: 0 .  fluticasone (FLONASE) 50 MCG/ACT nasal spray, Place 2 sprays into both nostrils daily., Disp: 16 g, Rfl: 6 .  Guaifenesin 1200 MG TB12, Take 1 tablet (1,200 mg total) by mouth every 12 (twelve) hours for 10 days., Disp: 20 tablet, Rfl: 0  Depression screen [facility] 2/9 12/15/2019  Decreased Interest 0  Down, Depressed, Hopeless 0  PHQ - 2 Score 0    No flowsheet data found.  -------------------------------------------------------------------------- O: No physical exam performed due to remote telephone encounter.  No results found for this or any previous visit (from the past 2160 hour(s)).  -------------------------------------------------------------------------- A&P:  Problem List Items Addressed This Visit      Nervous and Auditory   Eustachian tube dysfunction, bilateral - Primary    Eustachian tube dysfunction based on symptoms told to NP during telemedicine visit.  Will treat based on symptoms, can continue Claritin D to help with drying of secretions, sent in Rx for Mucinex 1200mg  to take PO Q12H for the next 10 days to help thin secretions and to use Flonase 2 puffs in each nostril daily for the next 10 days.  Discussed may begin to feel worse before he feels better as Mucinex is an expectorant and will thin secretions, so may have more of a runny nose or feels some discomfort in his ears/throat as the mucus begins to  clear.  Told patient to call us back with any worsening of symptoms and we can work together towards changing the treatment plan.  Verbalized understanding and denied any additional questions/concerns/needs at this time.      Relevant Medications   Guaifenesin 1200 MG TB12   fluticasone (FLONASE) 50 MCG/ACT nasal spray      Meds ordered this encounter  Medications  .  Guaifenesin 1200 MG TB12    Sig: Take 1 tablet (1,200 mg total) by mouth every 12 (twelve) hours for 10 days.    Dispense:  20 tablet    Refill:  0    Order Specific Question:   Supervising Provider    Answer:   Olin Hauser [[id number]]  . fluticasone (FLONASE) 50 MCG/ACT nasal spray    Sig: Place 2 sprays into both nostrils daily.    Dispense:  16 g    Refill:  6    Order Specific Question:   Supervising Provider    Answer:   Olin Hauser [[id number]]    Follow-up: To call us back with any worsening of symptoms, if he begins to have any yellow/green drainage from nose, fevers, increased sinus pain/pressure.  Patient verbalizes understanding with the above medical recommendations including the limitation of remote medical advice.  Specific follow-up and call-back criteria were given for patient to follow-up or seek medical care more urgently if needed.   - Time spent in direct consultation with patient on phone: 13 minutes  Harlin Rain, [facility] 12/15/2019, 1:33 PM

## 2019-12-15 NOTE — Assessment & Plan Note (Signed)
Eustachian tube dysfunction based on symptoms told to NP during telemedicine visit.  Will treat based on symptoms, can continue Claritin D to help with drying of secretions, sent in Rx for Mucinex 1200mg  to take PO Q12H for the next 10 days to help thin secretions and to use Flonase 2 puffs in each nostril daily for the next 10 days.  Discussed may begin to feel worse before he feels better as Mucinex is an expectorant and will thin secretions, so may have more of a runny nose or feels some discomfort in his ears/throat as the mucus begins to clear.  Told patient to call back with any worsening of symptoms and we can work together towards changing the treatment plan.  Verbalized understanding and denied any additional questions/concerns/needs at this time.

## 2019-12-15 NOTE — Patient Instructions (Signed)
To continue taking the Claritin D according to the packaging directions.  I have sent in a prescription for Mucinex 1200mg  to take 1 tablet every 12 hours for the next 10 days as well as Flonase to use 2 puffs in each nostril.  As we discussed, this medication is an expectorant and will thin the secretions that are in your ears, nose and throat.  You may have an increase in symptoms for the first day or two while the mucus is moving but if you have any worsening of symptoms, such as fevers, yellow/green drainage from nose, pain in your teeth, to call sooner and we will change your treatment plan.  Call us with any questions/concerns/needs  You will receive a survey after today's visit either digitally by e-mail or paper by [employer] mail. Your experiences and feedback matter to [place].  Please respond so we know how we are doing as we provide care for you.  Call us with any questions/concerns/needs.  It is my goal to be available to you for your health concerns.  Thanks for choosing me to be a partner in your healthcare needs!  [place], FNP-C Family Nurse Practitioner [facility] Phone: [phone]

## 2019-12-16 ENCOUNTER — Telehealth: Payer: Self-pay | Admitting: Family Medicine

## 2019-12-16 DIAGNOSIS — J019 Acute sinusitis, unspecified: Secondary | ICD-10-CM

## 2019-12-16 MED ORDER — AMOXICILLIN-POT CLAVULANATE 875-125 MG PO TABS: 1.0000 | ORAL_TABLET | Freq: Two times a day (BID) | ORAL | 0 refills | Status: DC

## 2019-12-16 NOTE — Telephone Encounter (Signed)
Persistent Right ear pain that was so severe last night he was unable to sleep. He stated that he tried everything recommended yesterday, but notice no relief.

## 2019-12-16 NOTE — Telephone Encounter (Signed)
Pt.called said that his ear pain have kept him up all night,pt said that he did what you told him to do and they is not any better. Pt call back # is [phone]

## 2019-12-16 NOTE — Telephone Encounter (Signed)
The pt was notified of the recommendations, no questions or concerns.

## 2019-12-27 ENCOUNTER — Other Ambulatory Visit: Payer: Self-pay | Admitting: Otolaryngology

## 2019-12-27 DIAGNOSIS — H9209 Otalgia, unspecified ear: Secondary | ICD-10-CM

## 2020-01-06 ENCOUNTER — Ambulatory Visit
Admission: RE | Admit: 2020-01-06 | Discharge: 2020-01-06 | Disposition: A | Discharge status: Home / Self Care | Payer: No Typology Code available for payment source | Source: Ambulatory Visit | Attending: Otolaryngology | Admitting: Otolaryngology

## 2020-01-06 ENCOUNTER — Other Ambulatory Visit: Payer: Self-pay

## 2020-01-06 DIAGNOSIS — H9209 Otalgia, unspecified ear: Secondary | ICD-10-CM

## 2020-01-06 MED ORDER — IOHEXOL 300 MG/ML  SOLN
75.0000 mL | Freq: Once | INTRAMUSCULAR | Status: AC | PRN
  Administered 2020-01-06: 75 mL via INTRAVENOUS

## 2020-03-16 IMAGING — CT CT NECK W/ CM
3 of 5 series · 12 of 33 positions shown, 14 images · IV contrast (omnipaque)
Comparison: None.

CLINICAL DATA: Right-sided earache for 6 months. Head swelling.
Otalgia of the right ear.

EXAM:
CT NECK WITH CONTRAST
TECHNIQUE: Multidetector CT imaging of the neck was performed using the
standard protocol following the bolus administration of intravenous
contrast.
CONTRAST:  75mL OMNIPAQUE IOHEXOL 300 MG/ML  SOLN

[Series 6: sag neck · sagittal · 0.43mm/px · 5 of 100 slices shown, 6 images]
[im 34/100  bone]
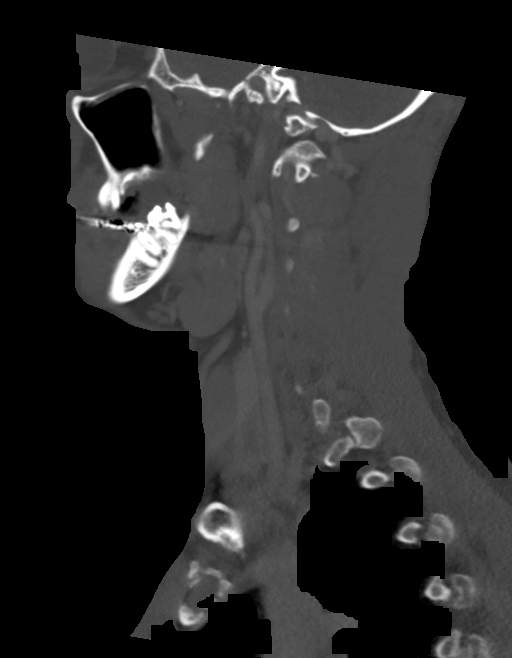
[im 42/100  bone]
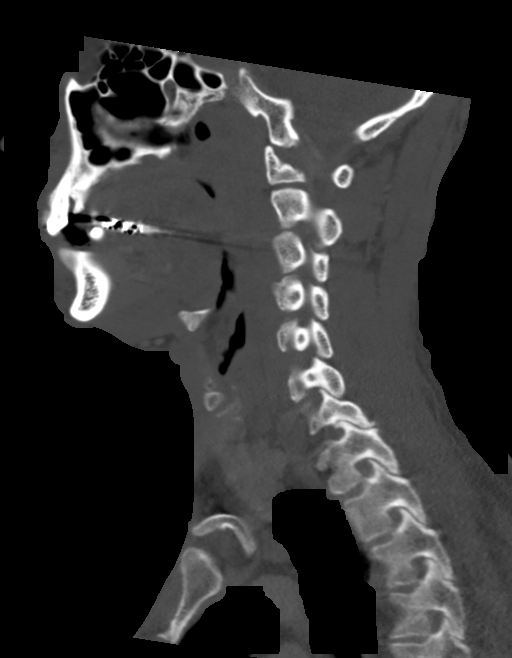
[im 50/100  soft-tissue]
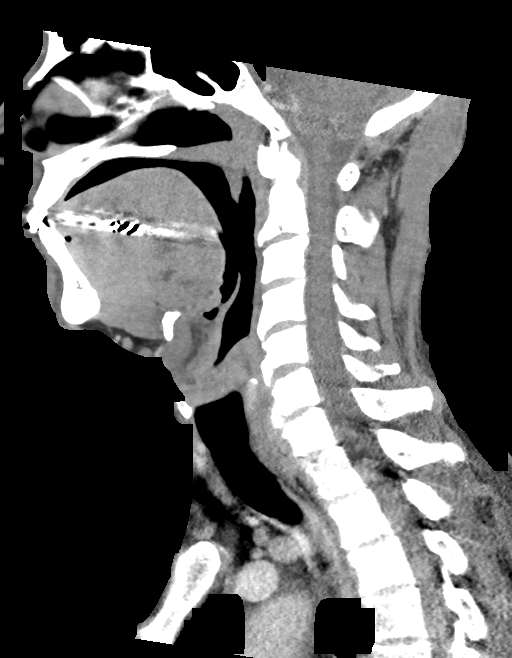
[im 50/100  bone]
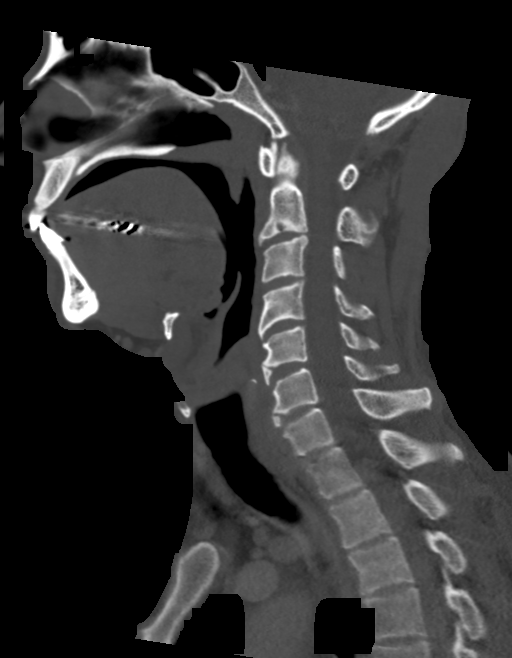
[im 58/100  bone]
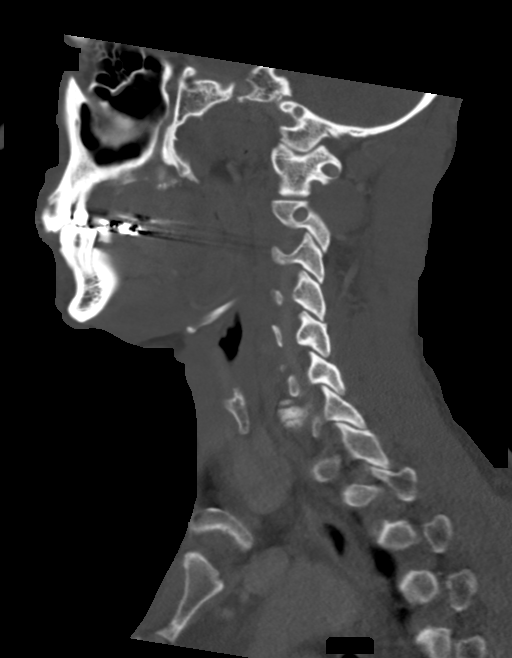
[im 67/100  bone]
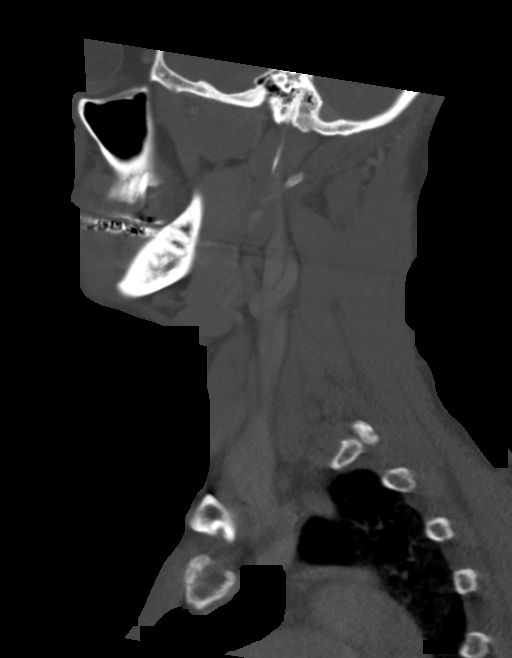

[Series 7: cor neck · coronal · 0.39mm/px · 3 of 117 slices shown]
[im 33/117  bone]
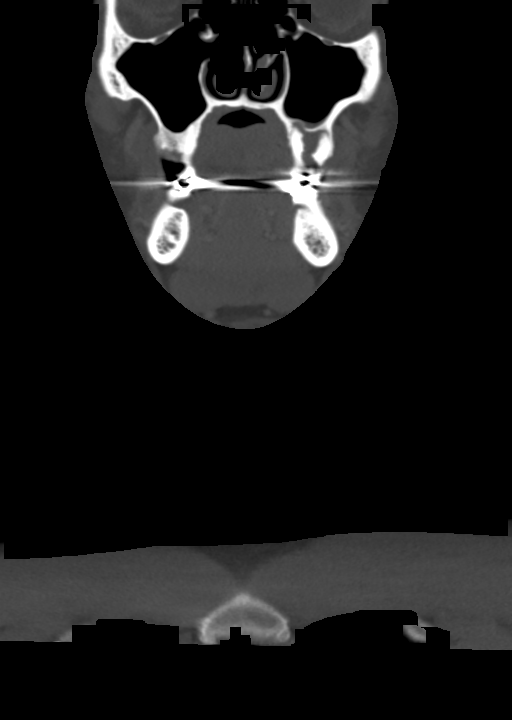
[im 51/117  bone]
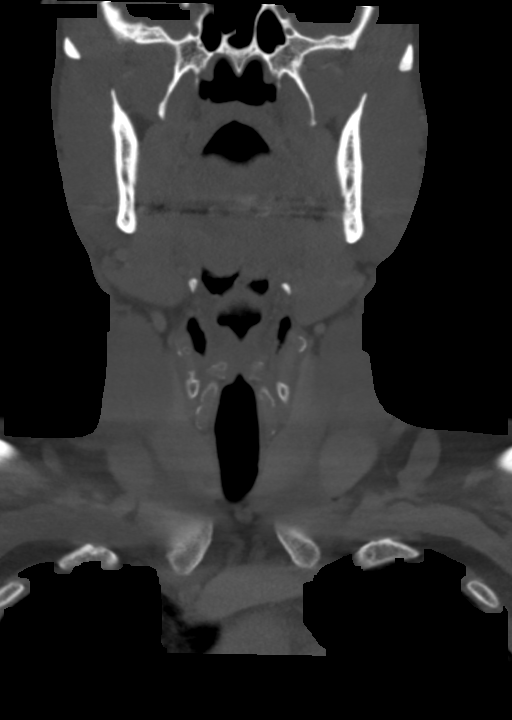
[im 69/117  bone]
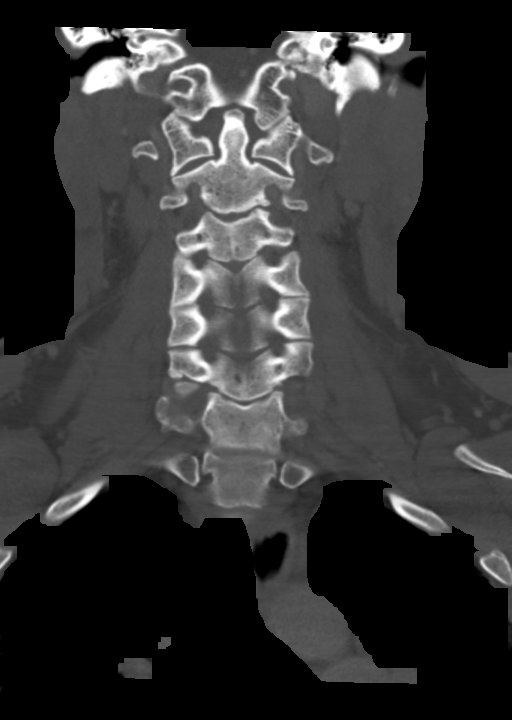

[Series 8: orthogonal ax · axial · 0.35mm/px · z∈[-335,-168]mm · 4 of 149 slices shown, 5 images]
[im 30/149  soft-tissue]
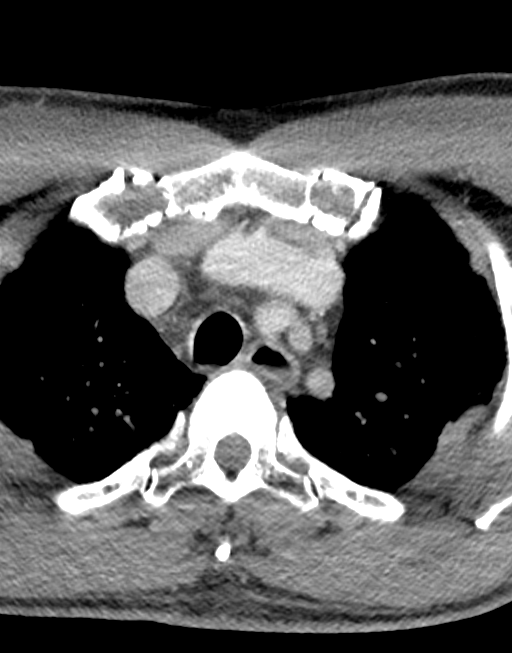
[im 30/149  bone]
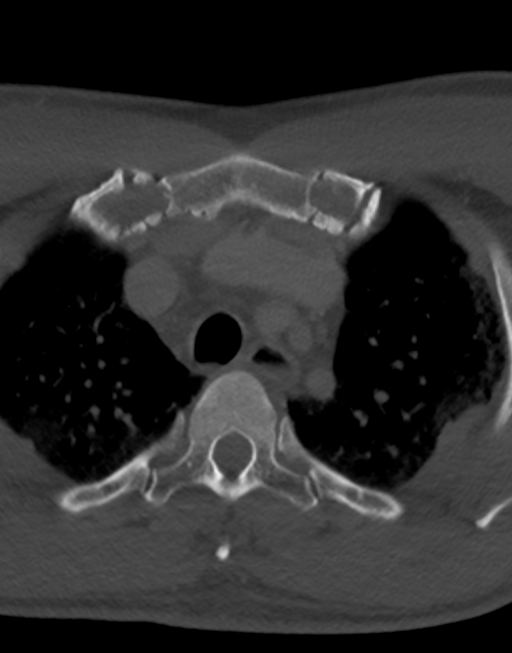
[im 60/149  bone]
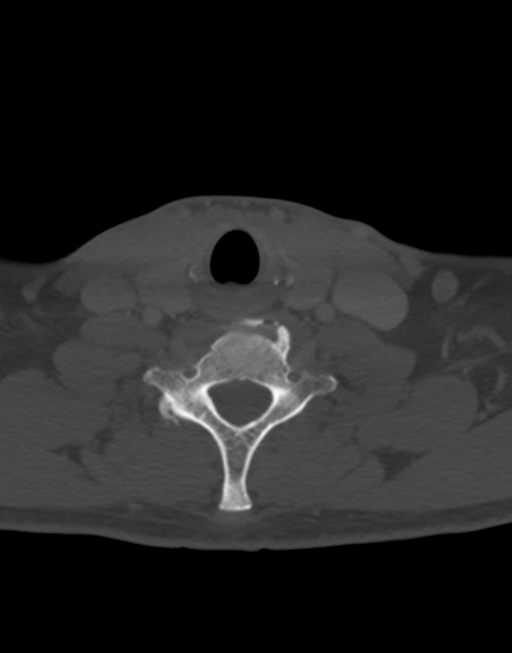
[im 89/149  bone]
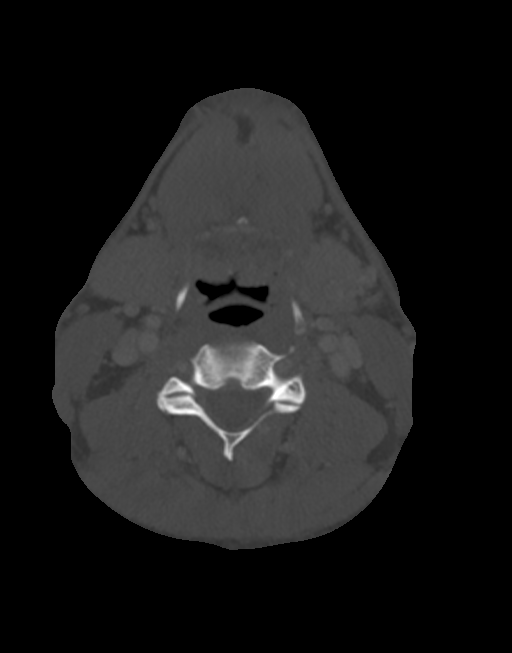
[im 119/149  bone]
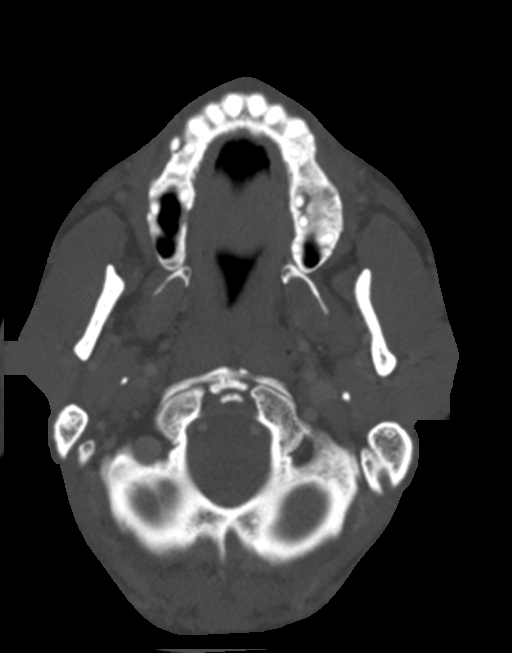

[12 of 33 positions shown; findings below may reference images not displayed]

FINDINGS: Pharynx and larynx: No focal mucosal or submucosal lesions are
present. The nasopharynx is within normal limits. Adenoid tissue and
soft palate are within normal limits. Tongue base and oropharynx are
clear. The epiglottis is within normal limits. Vocal cords are
midline and symmetric. Trachea is normal.

Salivary glands: The submandibular and parotid glands are within
normal limits bilaterally.

Thyroid: Normal

Lymph nodes: No significant cervical adenopathy is present.

Vascular: The internal carotid arteries are tortuous bilaterally
without significant stenosis. No other significant vascular disease
is present.

Limited intracranial: Unremarkable

Visualized orbits: Within normal limits

Mastoids and visualized paranasal sinuses: Clear

Skeleton: Vertebral body heights and alignment are maintained. Facet
degenerative changes are present at C7-T1. No focal lytic or blastic
lesions are present.

Upper chest: Mild dependent atelectasis is present. Patchy
ground-glass attenuation is noted. Thoracic inlet is within normal
limits. Esophagus is mildly dilated. Upper mediastinum is otherwise
unremarkable.
IMPRESSION: 1. No acute or focal lesion to explain right-sided otalgia.
2. Mild ground-glass attenuation of the lung apices. While this
likely reflects atelectasis. Infection is also considered.
3. Mild dilation of the esophagus may reflect esophageal
dysmotility.

## 2020-10-11 ENCOUNTER — Emergency Department
Admission: EM | Admit: 2020-10-11 | Discharge: 2020-10-11 | Disposition: A | Discharge status: Home / Self Care | Payer: No Typology Code available for payment source | Attending: Emergency Medicine | Admitting: Emergency Medicine

## 2020-10-11 ENCOUNTER — Encounter: Payer: Self-pay | Admitting: Emergency Medicine

## 2020-10-11 ENCOUNTER — Other Ambulatory Visit: Payer: Self-pay

## 2020-10-11 DIAGNOSIS — F1490 Cocaine use, unspecified, uncomplicated: Secondary | ICD-10-CM | POA: Diagnosis not present

## 2020-10-11 DIAGNOSIS — R531 Weakness: Secondary | ICD-10-CM | POA: Insufficient documentation

## 2020-10-11 DIAGNOSIS — R0602 Shortness of breath: Secondary | ICD-10-CM | POA: Diagnosis not present

## 2020-10-11 DIAGNOSIS — Z5321 Procedure and treatment not carried out due to patient leaving prior to being seen by health care provider: Secondary | ICD-10-CM | POA: Insufficient documentation

## 2020-10-11 LAB — BASIC METABOLIC PANEL
Anion gap: 11 (ref 5–15)
BUN: 16 mg/dL (ref 6–20)
CO2: 24 mmol/L (ref 22–32)
Calcium: 9.2 mg/dL (ref 8.9–10.3)
Chloride: 103 mmol/L (ref 98–111)
Creatinine, Ser: 1.15 mg/dL (ref 0.61–1.24)
GFR, Estimated: >60 mL/min (ref >60)
Glucose, Bld: 111 mg/dL — ABNORMAL HIGH (ref 70–99)
Potassium: 3.8 mmol/L (ref 3.5–5.1)
Sodium: 138 mmol/L (ref 135–145)

## 2020-10-11 LAB — TROPONIN I (HIGH SENSITIVITY): Troponin I (High Sensitivity): 95 ng/L — ABNORMAL HIGH (ref <18)

## 2020-10-11 LAB — CBC
HCT: 44.5 % (ref 39.0–52.0)
Hemoglobin: 14.9 g/dL (ref 13.0–17.0)
MCH: 28.3 pg (ref 26.0–34.0)
MCHC: 33.5 g/dL (ref 30.0–36.0)
MCV: 84.4 fL (ref 80.0–100.0)
Platelets: 314 10*3/uL (ref 150–400)
RBC: 5.27 MIL/uL (ref 4.22–5.81)
RDW: 13.2 % (ref 11.5–15.5)
WBC: 12.8 10*3/uL — ABNORMAL HIGH (ref 4.0–10.5)
nRBC: 0 % (ref 0.0–0.2)

## 2020-10-11 NOTE — ED Notes (Signed)
Pt called x's 3, no response ?

## 2020-10-11 NOTE — ED Triage Notes (Signed)
Pt in via EMS from home with c/o SOB after cocaine use. Pt used at about 5:30 and then started feeling SOB. 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 HR 113, 95% RA, 135/95 BP, FSBS 119

## 2020-10-11 NOTE — ED Triage Notes (Signed)
Pt called for reassessment, no response 

## 2020-10-11 NOTE — ED Triage Notes (Signed)
Pt called for VS reassessment and lab collection, no response °

## 2020-10-11 NOTE — ED Triage Notes (Signed)
Pt reports has been using drugs. Pt states did not feel himself but does not although he is really tired. Pt denies pain, SOB, HA or other symptoms. Pt reports used coaine

## 2020-10-12 NOTE — ED Notes (Signed)
Called patient and left message on VM.  Called [phone].

## 2021-08-26 ENCOUNTER — Ambulatory Visit: Payer: Self-pay | Admitting: Family Medicine

## 2021-08-28 ENCOUNTER — Encounter: Payer: Self-pay | Admitting: Family Medicine

## 2021-08-28 ENCOUNTER — Other Ambulatory Visit: Payer: Self-pay

## 2021-08-28 ENCOUNTER — Ambulatory Visit (INDEPENDENT_AMBULATORY_CARE_PROVIDER_SITE_OTHER): Payer: No Typology Code available for payment source | Admitting: Family Medicine

## 2021-08-28 VITALS — BP 133/85 | HR 77 | Ht 70.0 in | Wt 211.4 lb

## 2021-08-28 DIAGNOSIS — K429 Umbilical hernia without obstruction or gangrene: Secondary | ICD-10-CM | POA: Diagnosis not present

## 2021-08-28 DIAGNOSIS — M6208 Separation of muscle (nontraumatic), other site: Secondary | ICD-10-CM | POA: Diagnosis not present

## 2021-08-28 NOTE — Progress Notes (Signed)
Subjective:    Patient ID: Adam Cannon, male    DOB: 06-Jun-1966, 55 y.o.   MRN: [id number]  Adam Cannon is a 55 y.o. male presenting on 08/28/2021 for Umbilical Hernia  Previous PCP Danielle Rankin, FNP   HPI  Umbilical Hernia Reports symptoms onset 1 year ago, gradual onset, no known trigger. No prior hernia. He does work out with weight lifting, without heavy lifting, he does abdominal crunches. 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 No major constipation or coughing or intra abdominal pressure No prior surgery in this area involving umbilicus or abdominal wall Denies any pain, nausea vomiting  Depression screen PHQ 2/9 12/15/2019  Decreased Interest 0  Down, Depressed, Hopeless 0  PHQ - 2 Score 0    Social History   Tobacco Use   Smoking status: Never   Smokeless tobacco: Never  Vaping Use   Vaping Use: Never used  Substance Use Topics   Alcohol use: No   Drug use: Never    Review of Systems Per HPI unless specifically indicated above     Objective:    BP 133/85   Pulse 77   Ht 5\' 10"  (1.778 m)   Wt 211 lb 6.4 oz (95.9 kg)   SpO2 98%   BMI 30.33 kg/m   Wt Readings from Last 3 Encounters:  08/28/21 211 lb 6.4 oz (95.9 kg)  10/11/20 190 lb (86.2 kg)  06/06/19 193 lb 12.8 oz (87.9 kg)    Physical Exam Vitals and nursing note reviewed.  Constitutional:      General: He is not in acute distress.    Appearance: Normal appearance. He is well-developed. He is not diaphoretic.     Comments: Well-appearing, comfortable, cooperative  HENT:     Head: Normocephalic and atraumatic.  Eyes:     General:        Right eye: No discharge.        Left eye: No discharge.     Conjunctiva/sclera: Conjunctivae normal.  Cardiovascular:     Rate and Rhythm: Normal rate.  Pulmonary:     Effort: Pulmonary effort is normal.  Abdominal:     Hernia: A hernia (superior to umbilicus with bulging fullness without obvious herniation, it does seem to  resolve when laying flat. Diastasis recti prominent on sit up and valsalva) is present.  Skin:    General: Skin is warm and dry.     Findings: No erythema or rash.  Neurological:     Mental Status: He is alert and oriented to person, place, and time.  Psychiatric:        Mood and Affect: Mood normal.        Behavior: Behavior normal.        Thought Content: Thought content normal.     Comments: Well groomed, good eye contact, normal speech and thoughts   Results for orders placed or performed during the hospital encounter of 10/11/20  Basic metabolic panel  Result Value Ref Range   Sodium 138 135 - 145 mmol/L   Potassium 3.8 3.5 - 5.1 mmol/L   Chloride 103 98 - 111 mmol/L   CO2 24 22 - 32 mmol/L   Glucose, Bld 111 (H) 70 - 99 mg/dL   BUN 16 6 - 20 mg/dL   Creatinine, Ser 10/13/20 0.61 - 1.24 mg/dL   Calcium 9.2 8.9 - 8.29 mg/dL   GFR, Estimated 93.7 >16 mL/min   Anion gap 11 5 -  15  CBC  Result Value Ref Range   WBC 12.8 (H) 4.0 - 10.5 K/uL   RBC 5.27 4.22 - 5.81 MIL/uL   Hemoglobin 14.9 13.0 - 17.0 g/dL   HCT 28.7 86.7 - 67.2 %   MCV 84.4 80.0 - 100.0 fL   MCH 28.3 26.0 - 34.0 pg   MCHC 33.5 30.0 - 36.0 g/dL   RDW 09.4 70.9 - 62.8 %   Platelets 314 150 - 400 K/uL   nRBC 0.0 0.0 - 0.2 %  Troponin I (High Sensitivity)  Result Value Ref Range   Troponin I (High Sensitivity) 95 (H) <18 ng/L      Assessment & Plan:   Problem List Items Addressed This Visit   None Visit Diagnoses     Umbilical hernia without obstruction and without gangrene    -  Primary   Relevant Orders   Ambulatory referral to General Surgery   Diastasis recti       Relevant Orders   Ambulatory referral to General Surgery       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 Reassurance given with mild umbilical hernia and abdominal wall defect - likely progression of chronic problem. Discussed that ultimately gen surgery can do consultation and discuss surgical repair options  Referral sent to [facility], additional info per AVS.  Reviewed return criteria if severe worse or change or pain or incarcerated hernia etc when to seek care emergently   Orders Placed This Encounter  Procedures   Ambulatory referral to General Surgery    Referral Priority:   Routine    Referral Type:   Surgical    Referral Reason:   Specialty Services Required    Requested Specialty:   General Surgery    Number of Visits Requested:   1     No orders of the defined types were placed in this encounter.     Follow up plan: Return if symptoms worsen or fail to improve.   Saralyn Pilar, DO [facility] 08/28/2021, 4:30 PM

## 2021-08-28 NOTE — Patient Instructions (Addendum)
Thank you for coming to the office today.   [facility] Address: [address], [place], [place] [address] Phone: [phone]  This is caused by a weakness in your abdominal or groin muscles, and is caused by bowel or fatty tissue pushing through this weak spot causing pain and bulging.  Consider an abdominal wall binder if need to apply pressure to it   Try to find positions that give you most relief, likely laying down with head lower than body will allow the hernia bulging to go back into place and feel better.   May take Tylenol as needed. Recommend to start taking Tylenol Extra Strength 500mg  tabs - take 1 to 2 tabs per dose (max 1000mg ) every 6-8 hours for pain (take regularly, don't skip a dose for next 7 days), max 24 hour daily dose is 6 tablets or 3000mg . In the future you can repeat the same everyday Tylenol course for 1-2 weeks at a time.    Can try topical ice packs or muscle rub if burning nerve sensation.    If significant worsening pain or you get bulging that does NOT go down or go away and CANNOT push back in, or nausea, vomiting, then it is very important to go directly to hospital ED for more immediate evaluation, as this can be a life threatening surgical emergency     Umbilical Hernia, Adult     A hernia is a bulge of tissue that pushes through an opening between muscles. An umbilical hernia happens in the abdomen, near the belly button (umbilicus). The hernia may contain tissues from the small intestine, large intestine, or fatty tissue covering the intestines. Umbilical hernias in adults tend to get worse over time, and they require surgical treatment. There are different types of umbilical hernias, including: Indirect hernia. This type is located just above or below the umbilicus. It is the most common type of umbilical hernia in adults. Direct hernia. This type forms through an opening formed by the umbilicus. Reducible  hernia. This type of hernia comes and goes. It may be visible only when you strain, lift something heavy, or cough. This type of hernia can be pushed back into the abdomen (reduced). Incarcerated hernia. This type traps abdominal tissue inside the hernia. This type of hernia cannot be reduced. Strangulated hernia. This type of hernia cuts off blood flow to the tissues inside the hernia. The tissues can start to die if this happens. This type of hernia requires emergency treatment. What are the causes? An umbilical hernia happens when tissue inside the abdomen presses on a weak area of the abdominal muscles. What increases the risk? You may have a greater risk of this condition if you: Are obese. Have had several pregnancies. Have a buildup of fluid inside your abdomen. Have had surgery that weakens the abdominal muscles. What are the signs or symptoms? The main symptom of this condition is a painless bulge at or near the belly button. A reducible hernia may be visible only when you strain, lift something heavy, or cough. Other symptoms may include: Dull pain. A feeling of pressure. Symptoms of a strangulated hernia may include: Pain that gets increasingly worse. Nausea and vomiting. Pain when pressing on the hernia. Skin over the hernia becoming red or purple. Constipation. Blood in the stool. How is this diagnosed? This condition may be diagnosed based on: A physical exam. You may be asked to cough or strain while standing. These actions increase the pressure  inside your abdomen and can force the hernia through the opening in your muscles. Your health care provider may try to reduce the hernia by pressing on it. Your symptoms and medical history. How is this treated? Surgery is the only treatment for an umbilical hernia. Surgery for a strangulated hernia is done as soon as possible. If you have a small hernia that is not incarcerated, you may need to lose weight before having  surgery. Follow these instructions at home: Lose weight, if told by your health care provider. Do not try to push the hernia back in. Watch your hernia for any changes in color or size. Tell your health care provider if any changes occur. You may need to avoid activities that increase pressure on your hernia. Do not lift anything that is heavier than 10 lb (4.5 kg), or the limit that you are told, until your health care provider says that it is safe. Take over-the-counter and prescription medicines only as told by your health care provider. Keep all follow-up visits. This is important. Contact a health care provider if: Your hernia gets larger. Your hernia becomes painful. Get help right away if: You develop sudden, severe pain near the area of your hernia. You have pain as well as nausea or vomiting. You have pain and the skin over your hernia changes color. You develop a fever or chills. Summary A hernia is a bulge of tissue that pushes through an opening between muscles. An umbilical hernia happens near the belly button. Surgery is the only treatment for an umbilical hernia. Do not try to push your hernia back in. Keep all follow-up visits. This is important. This information is not intended to replace advice given to you by your health care provider. Make sure you discuss any questions you have with your health care provider. Document Revised: 05/14/2020 Document Reviewed: 05/14/2020 Elsevier Patient Education  2022 Elsevier Inc.    Please schedule a Follow-up Appointment to: Return if symptoms worsen or fail to improve.  If you have any other questions or concerns, please feel free to call the office or send a message through MyChart. You may also schedule an earlier appointment if necessary.  Additionally, you may be receiving a survey about your experience at our office within a few days to 1 week by e-mail or mail. We value your feedback.  Saralyn Pilar, DO [facility], [place]

## 2021-09-11 ENCOUNTER — Other Ambulatory Visit: Payer: Self-pay

## 2021-09-11 ENCOUNTER — Ambulatory Visit (INDEPENDENT_AMBULATORY_CARE_PROVIDER_SITE_OTHER): Payer: No Typology Code available for payment source | Admitting: Surgery

## 2021-09-11 ENCOUNTER — Encounter: Payer: Self-pay | Admitting: Surgery

## 2021-09-11 VITALS — BP 132/90 | HR 69 | Temp 98.6°F | Ht 70.0 in | Wt 216.0 lb

## 2021-09-11 DIAGNOSIS — K436 Other and unspecified ventral hernia with obstruction, without gangrene: Secondary | ICD-10-CM

## 2021-09-11 DIAGNOSIS — M6208 Separation of muscle (nontraumatic), other site: Secondary | ICD-10-CM

## 2021-09-11 NOTE — Patient Instructions (Addendum)
Our surgery scheduler Barbara will call you within 24-48 hours to get you scheduled. If you have not heard from her after 48 hours, please call our office. You will not need to get Covid tested before surgery and have the blue sheet available when she calls to write down important information. ° ° °If you have any concerns or questions, please feel free to call our office. ° °Umbilical Hernia, Adult °A hernia is a bulge of tissue that pushes through an opening between muscles. An umbilical hernia happens in the abdomen, near the belly button (umbilicus). The hernia may contain tissues from the small intestine, large intestine, or fatty tissue covering the intestines. Umbilical hernias in adults tend to get worse over time, and they require surgical treatment. °There are different types of umbilical hernias, including: °Indirect hernia. This type is located just above or below the umbilicus. It is the most common type of umbilical hernia in adults. °Direct hernia. This type forms through an opening formed by the umbilicus. °Reducible hernia. This type of hernia comes and goes. It may be visible only when you strain, lift something heavy, or cough. This type of hernia can be pushed back into the abdomen (reduced). °Incarcerated hernia. This type traps abdominal tissue inside the hernia. This type of hernia cannot be reduced. °Strangulated hernia. This type of hernia cuts off blood flow to the tissues inside the hernia. The tissues can start to die if this happens. This type of hernia requires emergency treatment. °What are the causes? °An umbilical hernia happens when tissue inside the abdomen presses on a weak area of the abdominal muscles. °What increases the risk? °You may have a greater risk of this condition if you: °Are obese. °Have had several pregnancies. °Have a buildup of fluid inside your abdomen. °Have had surgery that weakens the abdominal muscles. °What are the signs or symptoms? °The main symptom of  this condition is a painless bulge at or near the belly button. °A reducible hernia may be visible only when you strain, lift something heavy, or cough. Other symptoms may include: °Dull pain. °A feeling of pressure. °Symptoms of a strangulated hernia may include: °Pain that gets increasingly worse. °Nausea and vomiting. °Pain when pressing on the hernia. °Skin over the hernia becoming red or purple. °Constipation. °Blood in the stool. °How is this diagnosed? °This condition may be diagnosed based on: °A physical exam. You may be asked to cough or strain while standing. These actions increase the pressure inside your abdomen and can force the hernia through the opening in your muscles. Your health care provider may try to reduce the hernia by pressing on it. °Your symptoms and medical history. °How is this treated? °Surgery is the only treatment for an umbilical hernia. Surgery for a strangulated hernia is done as soon as possible. If you have a small hernia that is not incarcerated, you may need to lose weight before having surgery. °Follow these instructions at home: °Lose weight, if told by your health care provider. °Do not try to push the hernia back in. °Watch your hernia for any changes in color or size. Tell your health care provider if any changes occur. °You may need to avoid activities that increase pressure on your hernia. °Do not lift anything that is heavier than 10 lb (4.5 kg), or the limit that you are told, until your health care provider says that it is safe. °Take over-the-counter and prescription medicines only as told by your health care provider. °Keep all   follow-up visits. This is important. °Contact a health care provider if: °Your hernia gets larger. °Your hernia becomes painful. °Get help right away if: °You develop sudden, severe pain near the area of your hernia. °You have pain as well as nausea or vomiting. °You have pain and the skin over your hernia changes color. °You develop a fever  or chills. °Summary °A hernia is a bulge of tissue that pushes through an opening between muscles. An umbilical hernia happens near the belly button. °Surgery is the only treatment for an umbilical hernia. °Do not try to push your hernia back in. °Keep all follow-up visits. This is important. °This information is not intended to replace advice given to you by your health care provider. Make sure you discuss any questions you have with your health care provider. °Document Revised: 05/14/2020 Document Reviewed: 05/14/2020 °Elsevier Patient Education © 2022 Elsevier Inc. ° °

## 2021-09-14 ENCOUNTER — Encounter: Payer: Self-pay | Admitting: Surgery

## 2021-09-14 NOTE — Progress Notes (Signed)
Patient ID: Adam Cannon, male   DOB: [birth year], 55 y.o.   MRN: [id number]  HPI Adam Cannon is a 55 y.o. male seen in consultation at the request of Dr. Althea Charon for a symptomatic ventral hernia. He reports that for several months he has noticed a bulge one of his friends actually was the one that called it.  He reports some discomfort but when he is working out he has some intermittent pain.  No fevers no chills no evidence of obstruction.  He reports that the pain is still mild.  No prior abdominal operations.  He is able to perform more than 6 METS of activity without any shortness of breath or chest pain.  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 No drinking or smoking Did have recently a CT of the neck that have personally reviewed showing no acute abnormalities.  He was done for right ear pain.  Also reviewed his chest x-ray and is completely normal.  CBC from 11 months ago showed mild leukocytosis likely from acute inflammatory process at that time normal hemoglobin, normal platelets.  BMP was completely normal.  HPI  Past Medical History:  Diagnosis Date   Environmental and seasonal allergies     Past Surgical History:  Procedure Laterality Date   NO PAST SURGERIES      Family History  Problem Relation Age of Onset   Hypertension Mother    Breast cancer Mother    Hypertension Father    Healthy Sister    Healthy Brother    Hypertension Maternal Uncle    Heart attack Maternal Uncle     Social History Social History   Tobacco Use   Smoking status: Never   Smokeless tobacco: Never  Vaping Use   Vaping Use: Never used  Substance Use Topics   Alcohol use: No   Drug use: Never    Allergies  Allergen Reactions   Mucinex [Guaifenesin Er] Nausea And Vomiting    Allergic to Liquid form only per patient on 12/15/2019 call   Aspirin Rash    Remote past, has taken recently and had no hives (10/2018)    Current Outpatient Medications  Medication Sig Dispense Refill    naproxen sodium (ALEVE) 220 MG tablet Take 220 mg by mouth.     No current facility-administered medications for this visit.     Review of Systems Full ROS  was asked and was negative except for the information on the HPI  Physical Exam Blood pressure 132/90, pulse 69, temperature 98.6 F (37 C), temperature source Oral, height 5\' 10"  (1.778 m), weight 216 lb (98 kg), SpO2 98 %. CONSTITUTIONAL: NAD. EYES: Pupils are equal, round, Sclera are non-icteric. EARS, NOSE, MOUTH AND THROAT: He is wearing a mask. Hearing is intact to voice. LYMPH NODES:  Lymph nodes in the neck are normal. RESPIRATORY:  Lungs are clear. There is normal respiratory effort, with equal breath sounds bilaterally, and without pathologic use of accessory muscles. CARDIOVASCULAR: Heart is regular without murmurs, gallops, or rubs. GI: The abdomen is  soft, epigastric hernia that is chronically incarcerated ( preperitoneal fat)  and it is tender to palpation. Diastasis recti is present.There are no palpable masses. There is no  hepatosplenomegaly. There are normal bowel sounds  GU: Rectal deferred.   MUSCULOSKELETAL: Normal muscle strength and tone. No cyanosis or edema.   SKIN: Turgor is good and there are no pathologic skin lesions or ulcers. NEUROLOGIC: Motor and sensation is grossly normal.  Cranial nerves are grossly intact. PSYCH:  Oriented to person, place and time. Affect is normal.  Data Reviewed I have personally reviewed the patient's imaging, laboratory findings and medical records.    Assessment/Plan    55 year old with symptomatic ventral hernia epigastric type he may have another component of umbilical.  He does have associated diastases recti.  Discussed with the patient detail about his disease process. He wishes to proceed with surgical intervention.  Options given to the patient.  Discussed about different modalities of hernia repairs and I do think that he will be a good candidate for robotic  approach.  This will address any other potential epigastric versus umbilical defect that sometimes we may encounter.  Provcdure discussed with the patient detail.  Risks, benefits and possible implications including but not limited to, bleeding, infection, recurrence, pain and injury to adjacent organs.  He understands and wishes to proceed.  Please note that a copy of this report was sent to the referring provider   Sterling Big, MD FACS General Surgeon 09/14/2021, 5:39 PM

## 2021-09-16 ENCOUNTER — Telehealth: Payer: Self-pay | Admitting: Surgery

## 2021-09-16 NOTE — Telephone Encounter (Signed)
Left message for patient to call me so that we can schedule his surgery, need to look at some dates for scheduling.

## 2021-09-18 ENCOUNTER — Telehealth: Payer: Self-pay | Admitting: Surgery

## 2021-09-18 NOTE — Telephone Encounter (Signed)
Outgoing call is made, left message for patient to call, please inform patient of the following information:   Also patient will need a follow up appt with Dr. Everlene Farrier in January 2023 prior to his surgery for follow up.    Pre-Admission date/time, COVID Testing date and Surgery date.  Surgery Date: 10/31/21 Preadmission Testing Date: 10/21/21 (phone 8a-1p) Covid Testing Date: Not needed.     Also patient will need to call at [phone], between 1-3:00pm the day before surgery, to find out what time to arrive for surgery.

## 2021-09-18 NOTE — Telephone Encounter (Signed)
Incoming call from patient.  He is now aware of all dates for his surgery and verbalized understanding.

## 2021-10-02 ENCOUNTER — Telehealth: Payer: Self-pay | Admitting: Surgery

## 2021-10-02 NOTE — Telephone Encounter (Signed)
Patient calls today to cancel his surgery with Dr. Everlene Farrier on 10/31/21.  Patient has no coverage for his surgery through his current health insurance.  Per patient, he is in the process of shopping for new insurance at wants to cancel everything for now.  Patient states that once all is settled with his insurance he will call back and reschedule appointment with Dr. Everlene Farrier and start over from there.  Patient wished well.

## 2021-10-21 ENCOUNTER — Inpatient Hospital Stay: Admission: RE | Admit: 2021-10-21 | Payer: Self-pay | Source: Ambulatory Visit

## 2021-10-23 ENCOUNTER — Ambulatory Visit: Payer: Self-pay | Admitting: Surgery

## 2021-10-23 ENCOUNTER — Other Ambulatory Visit: Payer: Self-pay

## 2021-10-31 ENCOUNTER — Ambulatory Visit: Admit: 2021-10-31 | Payer: Self-pay | Admitting: Surgery

## 2021-10-31 SURGERY — REPAIR, HERNIA, VENTRAL, ROBOT-ASSISTED
Anesthesia: General

## 2021-12-09 ENCOUNTER — Ambulatory Visit: Payer: No Typology Code available for payment source | Admitting: Family Medicine

## 2021-12-18 ENCOUNTER — Ambulatory Visit: Payer: Self-pay

## 2021-12-18 NOTE — Telephone Encounter (Signed)
? ?  Chief Complaint: Non-productive cough with chest tightness ?Symptoms: Cough, runny nose. Treated at [facility] and no better. ?Frequency: Started 2 weeks ago ?Pertinent Negatives: Patient denies fever, SOB ?Disposition: [] ED /[] Urgent Care (no appt availability in office) / [] Appointment(In office/virtual)/ []  [place] [facility]/ [] Home Care/ [] Refused Recommended Disposition /[] [place] [facility]/ []  Follow-up with PCP ?Additional Notes: Has appointment this Friday, asking to be seen sooner if possible. Please advise pt.   ? ?Answer Assessment - Initial Assessment Questions ?1. ONSET: "When did the cough begin?"  ?    2 weeks ago ?2. SEVERITY: "How bad is the cough today?"  ?    Moderate ?3. SPUTUM: "Describe the color of your sputum" (none, dry cough; clear, white, yellow, green) ?    None ?4. HEMOPTYSIS: "Are you coughing up any blood?" If so ask: "How much?" (flecks, streaks, tablespoons, etc.) ?    No ?5. DIFFICULTY BREATHING: "Are you having difficulty breathing?" If Yes, ask: "How bad is it?" (e.g., mild, moderate, severe)  ?  - MILD: No SOB at rest, mild SOB with walking, speaks normally in sentences, can lie down, no retractions, pulse < 100.  ?  - MODERATE: SOB at rest, SOB with minimal exertion and prefers to sit, cannot lie down flat, speaks in phrases, mild retractions, audible wheezing, pulse 100-120.  ?  - SEVERE: Very SOB at rest, speaks in single words, struggling to breathe, sitting hunched forward, retractions, pulse > 120  ?    No ?6. FEVER: "Do you have a fever?" If Yes, ask: "What is your temperature, how was it measured, and when did it start?" ?    No ?7. CARDIAC HISTORY: "Do you have any history of heart disease?" (e.g., heart attack, congestive heart failure)  ?    No ?8. LUNG HISTORY: "Do you have any history of lung disease?"  (e.g., pulmonary embolus, asthma, emphysema) ?    No ?9. PE RISK FACTORS: "Do you have a history of blood clots?" (or: recent major surgery, recent prolonged  travel, bedridden) ?    No ?10. OTHER SYMPTOMS: "Do you have any other symptoms?" (e.g., runny nose, wheezing, chest pain) ?      Chest tightness, runny nose ?11. PREGNANCY: "Is there any chance you are pregnant?" "When was your last menstrual period?" ?      N/a ?12. TRAVEL: "Have you traveled out of the country in the last month?" (e.g., travel history, exposures) ?      No ? ?Protocols used: Cough - Acute Non-Productive-A-AH ? ?

## 2021-12-19 NOTE — Telephone Encounter (Signed)
Advice would be to be seen for evaluation of this problem. Unfortunately very frequent scenario with cough and symptoms that are not improving. He has apt tomorrow it seems. I would suggest keep that apt. ? ?Saralyn Pilar, DO ?[facility] ?[facility] ?12/19/2021, 10:56 AM ? ?

## 2021-12-20 ENCOUNTER — Encounter: Payer: Self-pay | Admitting: Family Medicine

## 2021-12-20 ENCOUNTER — Other Ambulatory Visit: Payer: Self-pay

## 2021-12-20 ENCOUNTER — Other Ambulatory Visit: Payer: Self-pay | Admitting: Family Medicine

## 2021-12-20 ENCOUNTER — Ambulatory Visit (INDEPENDENT_AMBULATORY_CARE_PROVIDER_SITE_OTHER): Payer: 59 | Admitting: Family Medicine

## 2021-12-20 VITALS — BP 140/78 | HR 90 | Ht 70.0 in | Wt 214.6 lb

## 2021-12-20 DIAGNOSIS — R233 Spontaneous ecchymoses: Secondary | ICD-10-CM

## 2021-12-20 DIAGNOSIS — Z0000 Encounter for general adult medical examination without abnormal findings: Secondary | ICD-10-CM

## 2021-12-20 DIAGNOSIS — J209 Acute bronchitis, unspecified: Secondary | ICD-10-CM | POA: Diagnosis not present

## 2021-12-20 MED ORDER — ALBUTEROL SULFATE HFA 108 (90 BASE) MCG/ACT IN AERS: 2.0000 | INHALATION_SPRAY | Freq: Four times a day (QID) | RESPIRATORY_TRACT | 0 refills | Status: DC | PRN

## 2021-12-20 MED ORDER — PREDNISONE 10 MG PO TABS: ORAL_TABLET | ORAL | 0 refills | Status: DC

## 2021-12-20 MED ORDER — AMOXICILLIN-POT CLAVULANATE 875-125 MG PO TABS: 1.0000 | ORAL_TABLET | Freq: Two times a day (BID) | ORAL | 0 refills | Status: DC

## 2021-12-20 NOTE — Progress Notes (Signed)
? ?Subjective:  ? ? Patient ID: Adam Cannon, male    DOB: Aug 17, 1966, 56 y.o.   MRN: [id number] ? ?Adam Cannon is a 56 y.o. male presenting on 12/20/2021 for bruise ? ? ?HPI ? ?Left Upper Extremity, spontaneous episodic bruising ?Onset initially 5-6 months ago, with episodic bruising occurring about once every few months only, reports he has sporadic bruising that shows up and can persist for few days, usually they are about size nickel to quarter, and non tender, no known injury or trauma to cause the bruising. No bruises elsewhere on body. Seems to resolve completely for several weeks to months. ?He does take Aspirin for headache and Naproxen PRN ? ? ?Recurrent Chest Congestion ?2-3 weeks ago he developed a chest cold, he went to walk in clinic and dx with bronchitis, he took medicine, then this week Monday - Tues he developed some return of symptoms. He has no longer had cough or productive symptoms. He does feel like chest congestion.  ? ? ? ?Depression screen [facility] 2/9 12/20/2021 12/15/2019  ?Decreased Interest 0 0  ?Down, Depressed, Hopeless 0 0  ?PHQ - 2 Score 0 0  ?Altered sleeping 0 -  ?Tired, decreased energy 0 -  ?Change in appetite 0 -  ?Feeling bad or failure about yourself  0 -  ?Trouble concentrating 0 -  ?Moving slowly or fidgety/restless 0 -  ?Suicidal thoughts 0 -  ?PHQ-9 Score 0 -  ?Difficult doing work/chores Not difficult at all -  ? ? ?Social History  ? ?Tobacco Use  ? Smoking status: Never  ? Smokeless tobacco: Never  ?Vaping Use  ? Vaping Use: Never used  ?Substance Use Topics  ? Alcohol use: No  ? Drug use: Never  ? ? ?Review of Systems ?Per HPI unless specifically indicated above ? ?   ?Objective:  ?  ?BP 140/78   Pulse 90   Ht 5\' 10"  (1.778 m)   Wt 214 lb 9.6 oz (97.3 kg)   SpO2 96%   BMI 30.79 kg/m?   ?Wt Readings from Last 3 Encounters:  ?12/20/21 214 lb 9.6 oz (97.3 kg)  ?09/11/21 216 lb (98 kg)  ?08/28/21 211 lb 6.4 oz (95.9 kg)  ?  ?Physical Exam ?Vitals and nursing note reviewed.   ?Constitutional:   ?   General: He is not in acute distress. ?   Appearance: Normal appearance. He is well-developed. He is not diaphoretic.  ?   Comments: Well-appearing, comfortable, cooperative  ?HENT:  ?   Head: Normocephalic and atraumatic.  ?Eyes:  ?   General:     ?   Right eye: No discharge.     ?   Left eye: No discharge.  ?   Conjunctiva/sclera: Conjunctivae normal.  ?Cardiovascular:  ?   Rate and Rhythm: Normal rate.  ?Pulmonary:  ?   Effort: Pulmonary effort is normal.  ?   Breath sounds: Wheezing and rhonchi present.  ?Skin: ?   General: Skin is warm and dry.  ?   Findings: Bruising (quarter sized resolving bruise isolated inner part of left arm upper) present. No erythema or rash.  ?Neurological:  ?   Mental Status: He is alert and oriented to person, place, and time.  ?Psychiatric:     ?   Mood and Affect: Mood normal.     ?   Behavior: Behavior normal.     ?   Thought Content: Thought content normal.  ?   Comments: Well groomed, good eye  contact, normal speech and thoughts  ? ?Results for orders placed or performed during the hospital encounter of 10/11/20  ?Basic metabolic panel  ?Result Value Ref Range  ? Sodium 138 135 - 145 mmol/L  ? Potassium 3.8 3.5 - 5.1 mmol/L  ? Chloride 103 98 - 111 mmol/L  ? CO2 24 22 - 32 mmol/L  ? Glucose, Bld 111 (H) 70 - 99 mg/dL  ? BUN 16 6 - 20 mg/dL  ? Creatinine, Ser 1.15 0.61 - 1.24 mg/dL  ? Calcium 9.2 8.9 - 10.3 mg/dL  ? GFR, Estimated >60 >60 mL/min  ? Anion gap 11 5 - 15  ?CBC  ?Result Value Ref Range  ? WBC 12.8 (H) 4.0 - 10.5 K/uL  ? RBC 5.27 4.22 - 5.81 MIL/uL  ? Hemoglobin 14.9 13.0 - 17.0 g/dL  ? HCT 44.5 39.0 - 52.0 %  ? MCV 84.4 80.0 - 100.0 fL  ? MCH 28.3 26.0 - 34.0 pg  ? MCHC 33.5 30.0 - 36.0 g/dL  ? RDW 13.2 11.5 - 15.5 %  ? Platelets 314 150 - 400 K/uL  ? nRBC 0.0 0.0 - 0.2 %  ?Troponin I (High Sensitivity)  ?Result Value Ref Range  ? Troponin I (High Sensitivity) 95 (H) <18 ng/L  ? ?   ?Assessment & Plan:  ? ?Problem List Items Addressed This  Visit   ?None ?Visit Diagnoses   ? ? Bruising, spontaneous    -  Primary  ? Acute bronchitis, unspecified organism      ? Relevant Medications  ? amoxicillin-clavulanate (AUGMENTIN) 875-125 MG tablet  ? predniSONE (DELTASONE) 10 MG tablet  ? albuterol (VENTOLIN HFA) 108 (90 Base) MCG/ACT inhaler  ? ?  ?  ?Clinically with sporadic localized bruising recurrent same area, history less indicative of generalized bleeding disorder or clotting disorder. No other easy bleed or bruise, no systemic symptoms. ?- seems that it could be related to Aspirin and or NSAID use as likely cause. Or can be a mechanical trigger for localized symptoms on Left arm only area it is occurs. ?- prior labs reviewed 2013 to 2021 no significant CBC abnormality ?- check upcoming labs as well ? ?Acute bronchitis, post viral ?Previously treated, now has deeper chest congestion ?Bronchospasm wheeze and congestion on exam ?Will order prednisone taper, Albuterol PRN, augmentin ? ?Meds ordered this encounter  ?Medications  ? amoxicillin-clavulanate (AUGMENTIN) 875-125 MG tablet  ?  Sig: Take 1 tablet by mouth 2 (two) times daily.  ?  Dispense:  20 tablet  ?  Refill:  0  ? predniSONE (DELTASONE) 10 MG tablet  ?  Sig: Take 6 tabs with breakfast Day 1, 5 tabs Day 2, 4 tabs Day 3, 3 tabs Day 4, 2 tabs Day 5, 1 tab Day 6.  ?  Dispense:  21 tablet  ?  Refill:  0  ? albuterol (VENTOLIN HFA) 108 (90 Base) MCG/ACT inhaler  ?  Sig: Inhale 2 puffs into the lungs every 6 (six) hours as needed for wheezing or shortness of breath.  ?  Dispense:  8 g  ?  Refill:  0  ? ? ? ? ?Follow up plan: ?Return in about 4 weeks (around 01/17/2022) for 4 weeks fasting lab then 1 week later Annual Physical. ? ?Future labs ordered for 01/13/22 ? ?Saralyn Pilar, DO ?[facility] ?[facility] ?12/20/2021, 2:12 PM ?

## 2021-12-20 NOTE — Patient Instructions (Addendum)
Thank you for coming to the office today. ? ?Will treat the bronchitis, likely after viral infection ? ?Use Augmentin antibiotic ? ?Albuterol inhaler as needed only for cough and breathing. ? ?Prednisone taper. (Only Tylenol) ? ?Likely easy bruising from Aspirin and Aleve. ? ?Consider switch to more Tylenol. ? ?Recommend to start taking Tylenol Extra Strength 500mg  tabs - take 1 to 2 tabs per dose (max 1000mg ) every 6-8 hours for pain (take regularly, don't skip a dose for next 7 days), max 24 hour daily dose is 6 tablets or 3000mg . In the future you can repeat the same everyday Tylenol course for 1-2 weeks at a time.  ? ? ?DUE for FASTING BLOOD WORK (no food or drink after midnight before the lab appointment, only water or coffee without cream/sugar on the morning of) ? ?SCHEDULE "Lab Only" visit in the morning at the clinic for lab draw in 4 WEEKS  ? ?- Make sure Lab Only appointment is at about 1 week before your next appointment, so that results will be available ? ?For Lab Results, once available within 2-3 days of blood draw, you can can log in to MyChart online to view your results and a brief explanation. Also, we can discuss results at next follow-up visit. ? ? ?Please schedule a Follow-up Appointment to: Return in about 4 weeks (around 01/17/2022) for 4 weeks fasting lab then 1 week later Annual Physical. ? ?If you have any other questions or concerns, please feel free to call the office or send a message through MyChart. You may also schedule an earlier appointment if necessary. ? ?Additionally, you may be receiving a survey about your experience at our office within a few days to 1 week by e-mail or mail. We value your feedback. ? ? , DO ?[facility], 01/19/2022 ?

## 2022-01-13 ENCOUNTER — Other Ambulatory Visit: Payer: 59

## 2022-01-16 ENCOUNTER — Ambulatory Visit: Payer: 59 | Admitting: Family Medicine

## 2022-01-17 ENCOUNTER — Encounter: Payer: Self-pay | Admitting: Family Medicine

## 2022-01-17 ENCOUNTER — Ambulatory Visit (INDEPENDENT_AMBULATORY_CARE_PROVIDER_SITE_OTHER): Payer: 59 | Admitting: Family Medicine

## 2022-01-17 VITALS — BP 122/84 | HR 89 | Temp 97.3°F | Wt 213.0 lb

## 2022-01-17 DIAGNOSIS — E669 Obesity, unspecified: Secondary | ICD-10-CM | POA: Diagnosis not present

## 2022-01-17 DIAGNOSIS — E663 Overweight: Secondary | ICD-10-CM | POA: Insufficient documentation

## 2022-01-17 MED ORDER — WEGOVY 0.25 MG/0.5ML ~~LOC~~ SOAJ: 0.2500 mg | SUBCUTANEOUS | 0 refills | Status: DC

## 2022-01-17 NOTE — Progress Notes (Signed)
? ?Subjective:  ? ? Patient ID: Adam Cannon, male    DOB: Aug 27, 1966, 56 y.o.   MRN: [id number] ? ?Adam Cannon is a 56 y.o. male presenting on 01/17/2022 for Weight Gain ? ? ?HPI ? ?Obesity BMI >30 ?Difficulty with weight management. Interested in medication options. ?Due for lab, no recent labs or A1c. ?He has had difficulty with losing weight through diet and exercise. ? ? ? ? ?  01/17/2022  ?  8:58 AM 12/20/2021  ?  1:38 PM 12/15/2019  ?  1:17 PM  ?Depression screen PHQ 2/9  ?Decreased Interest 0 0 0  ?Down, Depressed, Hopeless 0 0 0  ?PHQ - 2 Score 0 0 0  ?Altered sleeping 0 0   ?Tired, decreased energy 0 0   ?Change in appetite 0 0   ?Feeling bad or failure about yourself  0 0   ?Trouble concentrating 0 0   ?Moving slowly or fidgety/restless 0 0   ?Suicidal thoughts 0 0   ?PHQ-9 Score 0 0   ?Difficult doing work/chores Not difficult at all Not difficult at all   ? ? ?Social History  ? ?Tobacco Use  ? Smoking status: Never  ? Smokeless tobacco: Never  ?Vaping Use  ? Vaping Use: Never used  ?Substance Use Topics  ? Alcohol use: No  ? Drug use: Never  ? ? ?Review of Systems ?Per HPI unless specifically indicated above ? ?   ?Objective:  ?  ?BP 122/84 (BP Location: Left Arm, Patient Position: Sitting, Cuff Size: Large)   Pulse 89   Temp (!) 97.3 ?F (36.3 ?C) (Temporal)   Wt 213 lb (96.6 kg)   SpO2 99%   BMI 30.56 kg/m?   ?Wt Readings from Last 3 Encounters:  ?01/17/22 213 lb (96.6 kg)  ?12/20/21 214 lb 9.6 oz (97.3 kg)  ?09/11/21 216 lb (98 kg)  ?  ?Physical Exam ?Vitals and nursing note reviewed.  ?Constitutional:   ?   General: He is not in acute distress. ?   Appearance: Normal appearance. He is well-developed. He is not diaphoretic.  ?   Comments: Well-appearing, comfortable, cooperative  ?HENT:  ?   Head: Normocephalic and atraumatic.  ?Eyes:  ?   General:     ?   Right eye: No discharge.     ?   Left eye: No discharge.  ?   Conjunctiva/sclera: Conjunctivae normal.  ?Cardiovascular:  ?   Rate and Rhythm:  Normal rate.  ?Pulmonary:  ?   Effort: Pulmonary effort is normal.  ?Skin: ?   General: Skin is warm and dry.  ?   Findings: No erythema or rash.  ?Neurological:  ?   Mental Status: He is alert and oriented to person, place, and time.  ?Psychiatric:     ?   Mood and Affect: Mood normal.     ?   Behavior: Behavior normal.     ?   Thought Content: Thought content normal.  ?   Comments: Well groomed, good eye contact, normal speech and thoughts  ? ? ? ?Results for orders placed or performed during the hospital encounter of 10/11/20  ?Basic metabolic panel  ?Result Value Ref Range  ? Sodium 138 135 - 145 mmol/L  ? Potassium 3.8 3.5 - 5.1 mmol/L  ? Chloride 103 98 - 111 mmol/L  ? CO2 24 22 - 32 mmol/L  ? Glucose, Bld 111 (H) 70 - 99 mg/dL  ? BUN 16 6 - 20 mg/dL  ?  Creatinine, Ser 1.15 0.61 - 1.24 mg/dL  ? Calcium 9.2 8.9 - 10.3 mg/dL  ? GFR, Estimated >60 >60 mL/min  ? Anion gap 11 5 - 15  ?CBC  ?Result Value Ref Range  ? WBC 12.8 (H) 4.0 - 10.5 K/uL  ? RBC 5.27 4.22 - 5.81 MIL/uL  ? Hemoglobin 14.9 13.0 - 17.0 g/dL  ? HCT 44.5 39.0 - 52.0 %  ? MCV 84.4 80.0 - 100.0 fL  ? MCH 28.3 26.0 - 34.0 pg  ? MCHC 33.5 30.0 - 36.0 g/dL  ? RDW 13.2 11.5 - 15.5 %  ? Platelets 314 150 - 400 K/uL  ? nRBC 0.0 0.0 - 0.2 %  ?Troponin I (High Sensitivity)  ?Result Value Ref Range  ? Troponin I (High Sensitivity) 95 (H) <18 ng/L  ? ?   ?Assessment & Plan:  ? ?Problem List Items Addressed This Visit   ? ? Obesity (BMI 30.0-34.9) - Primary  ? Relevant Medications  ? WEGOVY 0.25 MG/0.5ML SOAJ  ? Other Relevant Orders  ? COMPLETE METABOLIC PANEL WITH GFR  ? Lipid panel  ? Hemoglobin A1c  ?  ?Obesity weight management ?Labs today A1c CMET Lipid ?Start sample [facility] 0.25mg  weekly inj x 4 week sample ?He will check cost coverage of this and Saxenda, also Contrave and determine best option he will let me know on mychart and we can order / work on [employer] as indicated. ? ? ?Meds ordered this encounter  ?Medications  ? WEGOVY 0.25 MG/0.5ML SOAJ   ?  Sig: Inject 0.25 mg into the skin once a week.  ?  Dispense:  2 mL  ?  Refill:  0  ? ? ? ?Follow up plan: ?Return in about 3 months (around 04/18/2022) for 3 month weight management. ? ? ?Saralyn Pilar, DO ?[facility] ?[facility] ?01/17/2022, 8:13 AM ?

## 2022-01-17 NOTE — Patient Instructions (Addendum)
Thank you for coming to the office today. ? ?Call insurance find cost and coverage of the following - check the following: ?- Drug Tier (higher tier = higher cost), Preferred List, On Formulary ?- All will require a "Prior Authorization" from [place] first, before you can find out the cost ?- Find out if there is "Step Therapy" (other medicines required before you can try these) ? ?Diagnosis code: Obesity BMI >30 (ICD10: E66.9) ? ?Once you pick the one you want to try, let me know - we can get a sample ready IF we have it in stock. Then try it - and before running out of medicine, contact me back to order your Rx so we have time to get it processed. ? ?--------------- ? ?For Weight Loss / Obesity only ? ?Wegovy (same as Ozempic) weekly injection - start 0.25mg  weekly, 1 dose per pen, single use, auto-injector ? ?2. Saxenda - DAILY injection - start 0.6mg  injection DAILY, sample is 3 weeks, new needle each dose. ? ? ?3 benefits ?- 1 significantly reduced A1c sugar, and may be able to reduce or stop metformin in future ?- 2 reduced appetite and weight loss with good results ?- 3 cardiovascular risk reduction, less likely to have heart attack/stroke ? ? ?--------------------------------------------- ? ?Other meds: ? ?- Contrave (oral medicine, dose increases after 1 month), mail order specialty pill. ? ?- Generic version of contrave (half of the medicine) = Wellbutrin (Bupropion) anti depressant, easy to use, affordable, decent results. ? ?- Phentermine (older, fat burning weight loss med, controlled) ? ? ? ?Please schedule a Follow-up Appointment to: Return in about 3 months (around 04/18/2022) for 3 month weight management. ? ?If you have any other questions or concerns, please feel free to call the office or send a message through MyChart. You may also schedule an earlier appointment if necessary. ? ?Additionally, you may be receiving a survey about your experience at our office within a few days to 1 week by e-mail or  mail. We value your feedback. ? ?Saralyn Pilar, DO ?[facility] [place] ?

## 2022-01-18 LAB — HEMOGLOBIN A1C
Hgb A1c MFr Bld: 6.1 % of total Hgb — ABNORMAL HIGH (ref <5.7)
Mean Plasma Glucose: 128 mg/dL
eAG (mmol/L): 7.1 mmol/L

## 2022-01-18 LAB — LIPID PANEL
Cholesterol: 229 mg/dL — ABNORMAL HIGH (ref <200)
HDL: 51 mg/dL (ref >=40)
LDL Cholesterol (Calc): 162 mg/dL (calc) — ABNORMAL HIGH
Non-HDL Cholesterol (Calc): 178 mg/dL (calc) — ABNORMAL HIGH (ref <130)
Total CHOL/HDL Ratio: 4.5 (calc) (ref <5.0)
Triglycerides: 65 mg/dL (ref <150)

## 2022-01-18 LAB — COMPLETE METABOLIC PANEL WITH GFR
AG Ratio: 1.3 (calc) (ref 1.0–2.5)
ALT: 34 U/L (ref 9–46)
AST: 25 U/L (ref 10–35)
Albumin: 4 g/dL (ref 3.6–5.1)
Alkaline phosphatase (APISO): 68 U/L (ref 35–144)
BUN: 9 mg/dL (ref 7–25)
CO2: 24 mmol/L (ref 20–32)
Calcium: 9.2 mg/dL (ref 8.6–10.3)
Chloride: 106 mmol/L (ref 98–110)
Creat: 1.2 mg/dL (ref 0.70–1.30)
Globulin: 3 g/dL (calc) (ref 1.9–3.7)
Glucose, Bld: 90 mg/dL (ref 65–99)
Potassium: 4.4 mmol/L (ref 3.5–5.3)
Sodium: 140 mmol/L (ref 135–146)
Total Bilirubin: 0.4 mg/dL (ref 0.2–1.2)
Total Protein: 7 g/dL (ref 6.1–8.1)
eGFR: 72 mL/min/{1.73_m2} (ref >=60)

## 2022-01-20 ENCOUNTER — Encounter: Payer: Self-pay | Admitting: Family Medicine

## 2022-01-20 ENCOUNTER — Ambulatory Visit: Payer: 59 | Admitting: Family Medicine

## 2022-01-20 ENCOUNTER — Encounter: Payer: 59 | Admitting: Family Medicine

## 2022-01-20 DIAGNOSIS — E7800 Pure hypercholesterolemia, unspecified: Secondary | ICD-10-CM | POA: Insufficient documentation

## 2022-01-20 DIAGNOSIS — R7303 Prediabetes: Secondary | ICD-10-CM | POA: Insufficient documentation

## 2022-01-20 MED ORDER — WEGOVY 0.25 MG/0.5ML ~~LOC~~ SOAJ: 0.2500 mg | SUBCUTANEOUS | 0 refills | Status: DC

## 2022-01-22 ENCOUNTER — Ambulatory Visit: Payer: 59 | Admitting: Family Medicine

## 2022-01-23 ENCOUNTER — Ambulatory Visit: Payer: 59 | Admitting: Family Medicine

## 2022-02-06 ENCOUNTER — Telehealth: Payer: Self-pay | Admitting: Family Medicine

## 2022-02-06 NOTE — Telephone Encounter (Signed)
Pt is calling to check on the status of the PA for [facility]. Pt took his last dose yesterday. ?[phone] ?

## 2022-02-07 NOTE — Telephone Encounter (Signed)
Denied. States that the pt did not have coverage. I attempted to call to see if he has new ins that I can submit.  ?

## 2022-02-14 ENCOUNTER — Other Ambulatory Visit: Payer: Self-pay | Admitting: Family Medicine

## 2022-02-14 DIAGNOSIS — E669 Obesity, unspecified: Secondary | ICD-10-CM

## 2022-02-14 NOTE — Telephone Encounter (Signed)
Requested Prescriptions  ?Pending Prescriptions Disp Refills  ?? WEGOVY 0.25 MG/0.5ML SOAJ [Pharmacy Med Name: Wegovy 0.25 MG/0.5ML Subcutaneous Solution Auto-injector] 4 mL 0  ?  Sig: INJECT 0.25MG  SUBCUTANEOUSLY ONCE A WEEK  ?  ? Endocrinology:  Diabetes - GLP-1 Receptor Agonists - semaglutide Failed - 02/14/2022 11:12 AM  ?  ?  Failed - HBA1C in normal range and within 180 days  ?  Hgb A1c MFr Bld  ?Date Value Ref Range Status  ?01/17/2022 6.1 (H) <5.7 % of total Hgb Final  ?  Comment:  ?  For someone without known diabetes, a hemoglobin  ?A1c value between 5.7% and 6.4% is consistent with ?prediabetes and should be confirmed with a  ?follow-up test. ?. ?For someone with known diabetes, a value <7% ?indicates that their diabetes is well controlled. A1c ?targets should be individualized based on duration of ?diabetes, age, comorbid conditions, and other ?considerations. ?. ?This assay result is consistent with an increased risk ?of diabetes. ?. ?Currently, no consensus exists regarding use of ?hemoglobin A1c for diagnosis of diabetes for children. ?. ?  ?   ?  ?  Passed - Cr in normal range and within 360 days  ?  Creat  ?Date Value Ref Range Status  ?01/17/2022 1.20 0.70 - 1.30 mg/dL Final  ?   ?  ?  Passed - Valid encounter within last 6 months  ?  Recent Outpatient Visits   ?      ? 4 weeks ago Obesity (BMI 30.0-34.9)  ? [facility] [place], Netta Neat, DO  ? 1 month ago Bruising, spontaneous  ? [facility] [place], Netta Neat, DO  ? 5 months ago Umbilical hernia without obstruction and without gangrene  ? [facility] Smitty Cords, DO  ? 2 years ago Eustachian tube dysfunction, bilateral  ? [facility], Jodelle Gross, FNP  ? 2 years ago Left varicocele  ? [facility] [place] Rudd, Alison Stalling, NP  ?  ?  ?Future Appointments   ?        ? In 2 months Althea Charon, Netta Neat, DO [facility]  ?   ? ?  ?  ?  ? ?

## 2022-02-24 ENCOUNTER — Encounter: Payer: Self-pay | Admitting: Family Medicine

## 2022-02-24 ENCOUNTER — Ambulatory Visit (INDEPENDENT_AMBULATORY_CARE_PROVIDER_SITE_OTHER): Payer: 59 | Admitting: Family Medicine

## 2022-02-24 VITALS — BP 116/80 | HR 95 | Ht 70.0 in | Wt 197.4 lb

## 2022-02-24 DIAGNOSIS — R399 Unspecified symptoms and signs involving the genitourinary system: Secondary | ICD-10-CM

## 2022-02-24 DIAGNOSIS — E669 Obesity, unspecified: Secondary | ICD-10-CM | POA: Diagnosis not present

## 2022-02-24 DIAGNOSIS — Z125 Encounter for screening for malignant neoplasm of prostate: Secondary | ICD-10-CM

## 2022-02-24 MED ORDER — WEGOVY 0.5 MG/0.5ML ~~LOC~~ SOAJ: 0.5000 mg | SUBCUTANEOUS | 2 refills | Status: DC

## 2022-02-24 NOTE — Progress Notes (Signed)
? ?Subjective:  ? ? Patient ID: Adam Cannon, male    DOB: Nov 19, 1965, 56 y.o.   MRN: [id number] ? ?Adam Cannon is a 56 y.o. male presenting on 02/24/2022 for PSA testing ? ? ?HPI ? ?Urinary Symptoms / Prostate Screening ? ?He is here today for screening, no prior dx BPH. ? ?AUA BPH Symptom Score over past 1 month ?1. Sensation of not emptying bladder post void - 0 ?2. Urinate less than 2 hour after finish last void - 4 ?3. Start/Stop several times during void - 0 ?4. Difficult to postpone urination - 2 ?5. Weak urinary stream - 3 ?6. Push or strain urination - 0 ?7. Nocturia - 2 times ? ?Total Score: 8-9 (Mild BPH symptoms) ? ?Weight down from 213 lbs down to 197 lbs ?Goal was to lose >4 % weight in 3-4 months, approx 8 lbs, now he has lost 16 lbs in 4-6 weeks approximately 8% body weight ?He feels better with weight loss and overall doing well. ?Diet has improved and eating better healthier, not eating late night ?Continues [phone] for weight loss ? ? ? ?  01/17/2022  ?  8:58 AM 12/20/2021  ?  1:38 PM 12/15/2019  ?  1:17 PM  ?Depression screen PHQ 2/9  ?Decreased Interest 0 0 0  ?Down, Depressed, Hopeless 0 0 0  ?PHQ - 2 Score 0 0 0  ?Altered sleeping 0 0   ?Tired, decreased energy 0 0   ?Change in appetite 0 0   ?Feeling bad or failure about yourself  0 0   ?Trouble concentrating 0 0   ?Moving slowly or fidgety/restless 0 0   ?Suicidal thoughts 0 0   ?PHQ-9 Score 0 0   ?Difficult doing work/chores Not difficult at all Not difficult at all   ? ? ?Social History  ? ?Tobacco Use  ? Smoking status: Never  ? Smokeless tobacco: Never  ?Vaping Use  ? Vaping Use: Never used  ?Substance Use Topics  ? Alcohol use: No  ? Drug use: Never  ? ? ?Review of Systems ?Per HPI unless specifically indicated above ? ?   ?Objective:  ?  ?BP 116/80   Pulse 95   Ht _0  (1.778 m)   Wt 197 lb 6.4 oz (89.5 kg)   SpO2 99%   BMI 28.32 kg/m?   ?Wt Readings from Last 3 Encounters:  ?02/24/22 197 lb 6.4 oz (89.5 kg)  ?01/17/22 213 lb (96.6  kg)  ?12/20/21 214 lb 9.6 oz (97.3 kg)  ?  ?Physical Exam ?Vitals and nursing note reviewed.  ?Constitutional:   ?   General: He is not in acute distress. ?   Appearance: Normal appearance. He is well-developed. He is not diaphoretic.  ?   Comments: Well-appearing, comfortable, cooperative  ?HENT:  ?   Head: Normocephalic and atraumatic.  ?Eyes:  ?   General:     ?   Right eye: No discharge.     ?   Left eye: No discharge.  ?   Conjunctiva/sclera: Conjunctivae normal.  ?Cardiovascular:  ?   Rate and Rhythm: Normal rate.  ?Pulmonary:  ?   Effort: Pulmonary effort is normal.  ?Genitourinary: ?   Comments: Rectal/DRE: Normal external exam without hemorrhoids fissures or abnormality. DRE with palpation of mildly enlarged prostate smooth symmetrical without nodule or tenderness. ?Skin: ?   General: Skin is warm and dry.  ?   Findings: No erythema or rash.  ?Neurological:  ?   Mental  Status: He is alert and oriented to person, place, and time.  ?Psychiatric:     ?   Mood and Affect: Mood normal.     ?   Behavior: Behavior normal.     ?   Thought Content: Thought content normal.  ?   Comments: Well groomed, good eye contact, normal speech and thoughts  ? ? ? ?Results for orders placed or performed in visit on 01/17/22  ?COMPLETE METABOLIC PANEL WITH GFR  ?Result Value Ref Range  ? Glucose, Bld 90 65 - 99 mg/dL  ? BUN 9 7 - 25 mg/dL  ? Creat 1.20 0.70 - 1.30 mg/dL  ? eGFR 72 > OR = 60 mL/min/1.39m  ? BUN/Creatinine Ratio NOT APPLICABLE 6 - 22 (calc)  ? Sodium 140 135 - 146 mmol/L  ? Potassium 4.4 3.5 - 5.3 mmol/L  ? Chloride 106 98 - 110 mmol/L  ? CO2 24 20 - 32 mmol/L  ? Calcium 9.2 8.6 - 10.3 mg/dL  ? Total Protein 7.0 6.1 - 8.1 g/dL  ? Albumin 4.0 3.6 - 5.1 g/dL  ? Globulin 3.0 1.9 - 3.7 g/dL (calc)  ? AG Ratio 1.3 1.0 - 2.5 (calc)  ? Total Bilirubin 0.4 0.2 - 1.2 mg/dL  ? Alkaline phosphatase (APISO) 68 35 - 144 U/L  ? AST 25 10 - 35 U/L  ? ALT 34 9 - 46 U/L  ?Lipid panel  ?Result Value Ref Range  ? Cholesterol 229 (H)  <200 mg/dL  ? HDL 51 > OR = 40 mg/dL  ? Triglycerides 65 <150 mg/dL  ? LDL Cholesterol (Calc) 162 (H) mg/dL (calc)  ? Total CHOL/HDL Ratio 4.5 <5.0 (calc)  ? Non-HDL Cholesterol (Calc) 178 (H) <130 mg/dL (calc)  ?Hemoglobin A1c  ?Result Value Ref Range  ? Hgb A1c MFr Bld 6.1 (H) <5.7 % of total Hgb  ? Mean Plasma Glucose 128 mg/dL  ? eAG (mmol/L) 7.1 mmol/L  ? ?   ?Assessment & Plan:  ? ?Problem List Items Addressed This Visit   ? ? Obesity (BMI 30.0-34.9)  ? Relevant Medications  ? WEGOVY 0.5 MG/0.5ML SOAJ  ? ?Other Visit Diagnoses   ? ? Lower urinary tract symptoms (LUTS)    -  Primary  ? Screening for prostate cancer      ? Relevant Orders  ? PSA  ? ?  ?  ?Consistent clinically with new diagnosis BPH mild, with lower urinary tract symptoms (LUTS) AUA BPH score 8-9 (mild) ?No prior meds ?- Last PSA not available. Due today ?- Last DRE today 02/24/22 mild enlarged prostate ?- No known personal/family history of prostate CA ? ?Plan: ?1. Start Saw Palmetto OTC 80-1642mBID dosing ?2. Check PSA today ?3. Modify fluid intake if need ?Counseling on prostate health, follow up as planned 3 months adjust accordingly ? ?Obesity ?He has met wt loss goal >4% in past 3 months approx ?Increase dose Wegovy from 0.2549meekly inj to 0.5mg61mse ? ?Meds ordered this encounter  ?Medications  ? WEGOVY 0.5 MG/0.5ML SOAJ  ?  Sig: Inject 0.5 mg into the skin once a week.  ?  Dispense:  2 mL  ?  Refill:  2  ? ? ? ? ?Follow up plan: ?Return in about 3 months (around 05/27/2022) for 3 month apt Weight update, re-schedule last one from July to August 2023. ? ? ?AlexNobie Putnam ?[facility] ?02/24/2022, 2:27 PM ?

## 2022-02-24 NOTE — Patient Instructions (Addendum)
Thank you for coming to the office today. ? ?Saw Palmetto 80 to 160mg  per dose can be taken twice per day.  Can reduce urinary symptoms. ? ?In future if ever needed, we could do rx strength Flomax. ? ?Finger check digital rectal exam today and PSA lab blood test for cancer screening. ? ?New dose Wegovy 0.5mg  weekly inj, sent today. ? ? ?Please schedule a Follow-up Appointment to: Return in about 3 months (around 05/27/2022) for 3 month apt Weight update, re-schedule last one from July to August 2023. ? ?If you have any other questions or concerns, please feel free to call the office or send a message through [facility]. You may also schedule an earlier appointment if necessary. ? ?Additionally, you may be receiving a survey about your experience at our office within a few days to 1 week by e-mail or mail. We value your feedback. ? ?Nobie Putnam, DO ?[place] ?

## 2022-02-25 LAB — PSA: PSA: 0.44 ng/mL (ref <=4.00)

## 2022-03-28 ENCOUNTER — Ambulatory Visit: Payer: Self-pay

## 2022-03-28 NOTE — Telephone Encounter (Signed)
Patient already sent a mychart message and it has been sent to Dr. Kirtland Bouchard.

## 2022-03-28 NOTE — Telephone Encounter (Signed)
  Chief Complaint: Unable to get medication Symptoms:  Frequency:  Pertinent Negatives: Patient denies  Disposition: [] ED /[] Urgent Care (no appt availability in office) / [] Appointment(In office/virtual)/ []  [facility]/ [] Home Care/ [] Refused Recommended Disposition /[]  Mobile Bus/ [x]  Follow-up with PCP Additional Notes: Pt states that [place] is out of medication and doe not expect more until September . Pt has also called several [facility], they are also out of this medication.   Pt is asking if there is a substitution for this medication or, if there is somewhere else that we can send the prescription.   Summary: wegovey med   Pt called in states [facility] doesn't have wegovy med and is looking for other alternatives. Please call back      Reason for Disposition  [1] Prescription refill request for ESSENTIAL medicine (i.e., likelihood of harm to patient if not taken) AND [2] triager unable to refill per department policy  Answer Assessment - Initial Assessment Questions 1. DRUG NAME: "What medicine do you need to have refilled?"     Wegovy 2. REFILLS REMAINING: "How many refills are remaining?" (Note: The label on the medicine or pill bottle will show how many refills are remaining. If there are no refills remaining, then a renewal may be needed.)      3. EXPIRATION DATE: "What is the expiration date?" (Note: The label states when the prescription will expire, and thus can no longer be refilled.)      4. PRESCRIBING HCP: "Who prescribed it?" Reason: If prescribed by specialist, call should be referred to that group.      5. SYMPTOMS: "Do you have any symptoms?"      6. PREGNANCY: "Is there any chance that you are pregnant?" "When was your last menstrual period?"     na  Protocols used: Medication Refill and Renewal Call-A-AH

## 2022-04-10 ENCOUNTER — Telehealth: Payer: Self-pay

## 2022-04-10 DIAGNOSIS — E669 Obesity, unspecified: Secondary | ICD-10-CM

## 2022-04-10 MED ORDER — WEGOVY 1.7 MG/0.75ML ~~LOC~~ SOAJ: 1.7000 mg | SUBCUTANEOUS | 2 refills | Status: DC

## 2022-04-10 NOTE — Telephone Encounter (Signed)
I have sent new rx Wegovy 1.7 to his [facility] as requested  It is a higher dose increase but it should be okay, he may have more side effects initially as he gets used to the higher dose.  Saralyn Pilar, DO [facility] 04/10/2022, 11:11 AM

## 2022-04-10 NOTE — Telephone Encounter (Signed)
Copied from CRM [id number]. Topic: General - Inquiry >> Apr 09, 2022  5:04 PM Pincus Sanes wrote: Reason for CRM: Pt has a specfic request that he wanted Dr Kirtland Bouchard to answer.  Declined nurse/ Re WEGOVY 0.5 MG/0.5ML SOAJ 2 mL 2 02/24/2022   Sig - Route: Inject 0.5 mg into the skin once a week. - Subcutaneous  This med was to be refilled and pt states that all the [facility] only have 1.7 or 2.4 MG He wants to know which would be appropriate?  If either wanting a script called into [facility] [address] ([address]), [place] - [address] [place]) [place] [address] Phone: [phone] Fax: [phone] Wants a fu call to advise. [phone]

## 2022-04-28 ENCOUNTER — Ambulatory Visit: Payer: 59 | Admitting: Family Medicine

## 2022-05-07 ENCOUNTER — Encounter: Payer: Self-pay | Admitting: Family Medicine

## 2022-05-07 ENCOUNTER — Ambulatory Visit (INDEPENDENT_AMBULATORY_CARE_PROVIDER_SITE_OTHER): Payer: 59 | Admitting: Family Medicine

## 2022-05-07 ENCOUNTER — Other Ambulatory Visit (HOSPITAL_COMMUNITY)
Admission: RE | Admit: 2022-05-07 | Discharge: 2022-05-07 | Disposition: A | Discharge status: Home / Self Care | Payer: 59 | Source: Ambulatory Visit | Attending: Family Medicine | Admitting: Family Medicine

## 2022-05-07 VITALS — BP 123/74 | HR 74 | Ht 70.0 in | Wt 196.2 lb

## 2022-05-07 DIAGNOSIS — Z202 Contact with and (suspected) exposure to infections with a predominantly sexual mode of transmission: Secondary | ICD-10-CM | POA: Diagnosis not present

## 2022-05-07 DIAGNOSIS — N342 Other urethritis: Secondary | ICD-10-CM

## 2022-05-07 DIAGNOSIS — Z7251 High risk heterosexual behavior: Secondary | ICD-10-CM | POA: Insufficient documentation

## 2022-05-07 MED ORDER — DOXYCYCLINE HYCLATE 100 MG PO TABS: 100.0000 mg | ORAL_TABLET | Freq: Two times a day (BID) | ORAL | 0 refills | Status: DC

## 2022-05-07 MED ORDER — CEFTRIAXONE SODIUM 500 MG IJ SOLR
500.0000 mg | Freq: Once | INTRAMUSCULAR | Status: AC
  Administered 2022-05-07: 500 mg via INTRAMUSCULAR

## 2022-05-07 NOTE — Patient Instructions (Addendum)
Urine and Labs today Testing for gonorrhea, Chlamydia in the urine. Testing for HIV and Syphilis in the blood  Treatment today will cure gonorrhea and chlamydia Ceftriaxone 500mg  injection x 1 dose today  Start taking Doxycycline antibiotic 100mg  twice daily for 7 days. Take with full glass of water and stay upright for at least 30 min after taking, may be seated or standing, but should NOT lay down. This is just a safety precaution, if this medicine does not go all the way down throat well it could cause some burning discomfort to throat and esophagus.  Please schedule a Follow-up Appointment to: Return if symptoms worsen or fail to improve.  If you have any other questions or concerns, please feel free to call the office or send a message through MyChart. You may also schedule an earlier appointment if necessary.  Additionally, you may be receiving a survey about your experience at our office within a few days to 1 week by e-mail or mail. We value your feedback.  , DO [facility], Saralyn Pilar

## 2022-05-07 NOTE — Progress Notes (Signed)
Subjective:    Patient ID: Adam Cannon, male    DOB: May 27, 1966, 56 y.o.   MRN: [id number]  Adam Cannon is a 55 y.o. male presenting on 05/07/2022 for urinary discharge   HPI  Urethritis, discharge Exposure to STD 2 week ago with sexual intercourse, unprotected, partner has since told him that she was diagnosed with bacterial infection. Unsure what kind or if STI or BV. He has had milky discharge fairly constant and some pain with urethral symptoms. No prior gonorrhea or chlamydia infection  Denies fever or chills, dysuria blood in urine nausea vomiting rash or ulceration on genitals      01/17/2022    8:58 AM 12/20/2021    1:38 PM 12/15/2019    1:17 PM  Depression screen PHQ 2/9  Decreased Interest 0 0 0  Down, Depressed, Hopeless 0 0 0  PHQ - 2 Score 0 0 0  Altered sleeping 0 0   Tired, decreased energy 0 0   Change in appetite 0 0   Feeling bad or failure about yourself  0 0   Trouble concentrating 0 0   Moving slowly or fidgety/restless 0 0   Suicidal thoughts 0 0   PHQ-9 Score 0 0   Difficult doing work/chores Not difficult at all Not difficult at all     Social History   Tobacco Use   Smoking status: Never   Smokeless tobacco: Never  Vaping Use   Vaping Use: Never used  Substance Use Topics   Alcohol use: No   Drug use: Never    Review of Systems Per HPI unless specifically indicated above     Objective:    BP 123/74   Pulse 74   Ht 5\' 10"  (1.778 m)   Wt 196 lb 3.2 oz (89 kg)   SpO2 100%   BMI 28.15 kg/m   Wt Readings from Last 3 Encounters:  05/07/22 196 lb 3.2 oz (89 kg)  02/24/22 197 lb 6.4 oz (89.5 kg)  01/17/22 213 lb (96.6 kg)    Physical Exam Vitals and nursing note reviewed.  Constitutional:      General: He is not in acute distress.    Appearance: Normal appearance. He is well-developed. He is not diaphoretic.     Comments: Well-appearing, comfortable, cooperative  HENT:     Head: Normocephalic and atraumatic.  Eyes:      General:        Right eye: No discharge.        Left eye: No discharge.     Conjunctiva/sclera: Conjunctivae normal.  Cardiovascular:     Rate and Rhythm: Normal rate.  Pulmonary:     Effort: Pulmonary effort is normal.  Skin:    General: Skin is warm and dry.     Findings: No erythema or rash.  Neurological:     Mental Status: He is alert and oriented to person, place, and time.  Psychiatric:        Mood and Affect: Mood normal.        Behavior: Behavior normal.        Thought Content: Thought content normal.     Comments: Well groomed, good eye contact, normal speech and thoughts      Results for orders placed or performed in visit on 02/24/22  PSA  Result Value Ref Range   PSA 0.44 < OR = 4.00 ng/mL      Assessment & Plan:   Problem List Items Addressed This Visit  None Visit Diagnoses     Urethritis    -  Primary   Relevant Medications   doxycycline (VIBRA-TABS) 100 MG tablet   cefTRIAXone (ROCEPHIN) injection 500 mg (Start on 05/07/2022 11:45 AM)   Other Relevant Orders   GC/Chlamydia probe amp ([place])not at [facility]   HIV Antibody (routine testing w rflx)   RPR   Exposure to sexually transmitted disease (STD)       Relevant Medications   doxycycline (VIBRA-TABS) 100 MG tablet   cefTRIAXone (ROCEPHIN) injection 500 mg (Start on 05/07/2022 11:45 AM)   Other Relevant Orders   GC/Chlamydia probe amp ([place])not at [facility]   HIV Antibody (routine testing w rflx)   RPR   High risk heterosexual behavior       Relevant Medications   doxycycline (VIBRA-TABS) 100 MG tablet   cefTRIAXone (ROCEPHIN) injection 500 mg (Start on 05/07/2022 11:45 AM)   Other Relevant Orders   GC/Chlamydia probe amp ([place])not at [facility]   HIV Antibody (routine testing w rflx)   RPR       Orders Placed This Encounter  Procedures   HIV Antibody (routine testing w rflx)   RPR   High risk heterosexual behavior Suspected STD Exposure with Urethritis symptoms in male  Urine  and Labs today Testing for gonorrhea, Chlamydia in the urine. Testing for HIV and Syphilis in the blood  Treatment today will cure gonorrhea and chlamydia Ceftriaxone 500mg  injection x 1 dose today  Start taking Doxycycline antibiotic 100mg  twice daily for 7 days. Take with full glass of water and stay upright for at least 30 min after taking, may be seated or standing, but should NOT lay down. This is just a safety precaution, if this medicine does not go all the way down throat well it could cause some burning discomfort to throat and esophagus.  Abstain from sexual contact / intercourse for up to 7-14 days.  Follow up as advised if unresolved.  Meds ordered this encounter  Medications   doxycycline (VIBRA-TABS) 100 MG tablet    Sig: Take 1 tablet (100 mg total) by mouth 2 (two) times daily. For 7 days. Take with full glass of water, stay upright 30 min after taking.    Dispense:  14 tablet    Refill:  0   cefTRIAXone (ROCEPHIN) injection 500 mg      Follow up plan: Return if symptoms worsen or fail to improve.    , DO [facility] 05/07/2022, 11:41 AM

## 2022-05-08 LAB — RPR: RPR Ser Ql: NONREACTIVE

## 2022-05-08 LAB — HIV ANTIBODY (ROUTINE TESTING W REFLEX): HIV 1&2 Ab, 4th Generation: NONREACTIVE

## 2022-05-09 LAB — GC/CHLAMYDIA PROBE AMP (~~LOC~~) NOT AT ARMC
Chlamydia: NEGATIVE
Comment: NEGATIVE
Comment: NORMAL
Neisseria Gonorrhea: NEGATIVE

## 2022-06-02 ENCOUNTER — Encounter: Payer: Self-pay | Admitting: Family Medicine

## 2022-06-02 ENCOUNTER — Other Ambulatory Visit (HOSPITAL_COMMUNITY): Admission: RE | Admit: 2022-06-02 | Payer: 59 | Source: Ambulatory Visit

## 2022-06-02 ENCOUNTER — Ambulatory Visit (INDEPENDENT_AMBULATORY_CARE_PROVIDER_SITE_OTHER): Payer: BLUE CROSS/BLUE SHIELD | Admitting: Family Medicine

## 2022-06-02 VITALS — BP 119/79 | HR 94 | Ht 70.0 in | Wt 191.6 lb

## 2022-06-02 DIAGNOSIS — E669 Obesity, unspecified: Secondary | ICD-10-CM

## 2022-06-02 DIAGNOSIS — Z7251 High risk heterosexual behavior: Secondary | ICD-10-CM

## 2022-06-02 DIAGNOSIS — E66811 Obesity, class 1: Secondary | ICD-10-CM

## 2022-06-02 DIAGNOSIS — Z202 Contact with and (suspected) exposure to infections with a predominantly sexual mode of transmission: Secondary | ICD-10-CM | POA: Diagnosis not present

## 2022-06-02 DIAGNOSIS — N342 Other urethritis: Secondary | ICD-10-CM

## 2022-06-02 MED ORDER — AZITHROMYCIN 250 MG PO TABS: ORAL_TABLET | ORAL | 0 refills | Status: DC

## 2022-06-02 NOTE — Progress Notes (Signed)
Subjective:    Patient ID: Adam Cannon, male    DOB: 05/10/1966, 56 y.o.   MRN: [id number]  Adam Cannon is a 56 y.o. male presenting on 06/02/2022 for Weight Check   HPI  Follow-up Urethritis, discharge Exposure to STD Last visit 05/07/22 he has been abstinent from sexual intercourse. He notes that previously he experienced urethral discharge more severe and fairly constant it was worse after BM. He had testing here for STI with urine GC Chlamydia, HIV, RPR all negative. He was still treated empirically with Ceftriaxone IM 500mg  and Doxycycyline 1 week ago he had one more similar episode of urethral discharge after BM with straining.  Not having dysuria, frequency   Obesity BMI Difficulty with weight management. Initiated GLP1 therapy in March 2023, starting weight 214 lbs, he has lost down to 191 lbs He has had difficulty with losing weight through diet and exercise. He has been successful in losing weight, >4% body weight, with 23 lbs lost already Continues Wegovy 1.7mg  weekly      01/17/2022    8:58 AM 12/20/2021    1:38 PM 12/15/2019    1:17 PM  Depression screen PHQ 2/9  Decreased Interest 0 0 0  Down, Depressed, Hopeless 0 0 0  PHQ - 2 Score 0 0 0  Altered sleeping 0 0   Tired, decreased energy 0 0   Change in appetite 0 0   Feeling bad or failure about yourself  0 0   Trouble concentrating 0 0   Moving slowly or fidgety/restless 0 0   Suicidal thoughts 0 0   PHQ-9 Score 0 0   Difficult doing work/chores Not difficult at all Not difficult at all     Social History   Tobacco Use   Smoking status: Never   Smokeless tobacco: Never  Vaping Use   Vaping Use: Never used  Substance Use Topics   Alcohol use: No   Drug use: Never    Review of Systems Per HPI unless specifically indicated above     Objective:    BP 119/79   Pulse 94   Ht 5\' 10"  (1.778 m)   Wt 191 lb 9.6 oz (86.9 kg)   SpO2 100%   BMI 27.49 kg/m   Wt Readings from Last 3  Encounters:  06/02/22 191 lb 9.6 oz (86.9 kg)  05/07/22 196 lb 3.2 oz (89 kg)  02/24/22 197 lb 6.4 oz (89.5 kg)    Physical Exam Vitals and nursing note reviewed.  Constitutional:      General: He is not in acute distress.    Appearance: Normal appearance. He is well-developed. He is not diaphoretic.     Comments: Well-appearing, comfortable, cooperative  HENT:     Head: Normocephalic and atraumatic.  Eyes:     General:        Right eye: No discharge.        Left eye: No discharge.     Conjunctiva/sclera: Conjunctivae normal.  Cardiovascular:     Rate and Rhythm: Normal rate.  Pulmonary:     Effort: Pulmonary effort is normal.  Skin:    General: Skin is warm and dry.     Findings: No erythema or rash.  Neurological:     Mental Status: He is alert and oriented to person, place, and time.  Psychiatric:        Mood and Affect: Mood normal.        Behavior: Behavior normal.  Thought Content: Thought content normal.     Comments: Well groomed, good eye contact, normal speech and thoughts    Results for orders placed or performed in visit on 05/07/22  HIV Antibody (routine testing w rflx)  Result Value Ref Range   HIV 1&2 Ab, 4th Generation NON-REACTIVE NON-REACTIVE  RPR  Result Value Ref Range   RPR Ser Ql NON-REACTIVE NON-REACTIVE  GC/Chlamydia probe amp ([place])not at [facility]  Result Value Ref Range   Neisseria Gonorrhea Negative    Chlamydia Negative    Comment Normal Reference [place] Chlamydia - Negative    Comment      Normal Reference Range Neisseria Gonorrhea - Negative      Assessment & Plan:   Problem List Items Addressed This Visit     Obesity (BMI 30.0-34.9)   Other Visit Diagnoses     Urethritis    -  Primary   Relevant Medications   azithromycin (ZITHROMAX Z-PAK) 250 MG tablet   Other Relevant Orders   GC/Chlamydia probe amp ([place])not at [facility]   High risk heterosexual behavior       Relevant Orders   GC/Chlamydia probe amp ([facility])not at [facility]   Exposure to sexually transmitted disease (STD)       Relevant Medications   azithromycin (ZITHROMAX Z-PAK) 250 MG tablet   Other Relevant Orders   GC/Chlamydia probe amp ([place])not at [facility]       Urethritis Previous STD Panel and urine negative, already empiric treated w/ Ceftriaxone IM 500 and Doxy x 7 days last time, symptoms improved but did not 100% resolve. No further sexual intercourse  Repeat Urine test tomorrow for GC Chlamydia 1st or 2nd urine of the day, on Tuesday 8/15, arrive 745 to 8am for urine sample Go ahead and pick up Azithromycin Zpak 5 day course, antibiotic. Today but start AFTER urine sample.  If still negative and still symptoms, then we would consider referral to Urologist for consult. Let me know. Or if resolved, then you're good.  Obesity BMI >30 Significant improvement on course of diet lifestyle management ad medication On Wegovy since 12/2021, he has lost 23 lbs approx 12% body weight Keep on Wegovy 1.7mg  weekly inj  Meds ordered this encounter  Medications   azithromycin (ZITHROMAX Z-PAK) 250 MG tablet    Sig: Take 2 tabs (500mg  total) on Day 1. Take 1 tab (250mg ) daily for next 4 days.    Dispense:  6 tablet    Refill:  0     Follow up plan: Return in about 3 months (around 09/02/2022) for 3 month follow-up PreDM A1c Weight.   , DO [facility] 06/02/2022, 1:55 PM

## 2022-06-02 NOTE — Patient Instructions (Addendum)
Thank you for coming to the office today.  Repeat Urine test, 1st or 2nd urine of the day, on Tuesday 8/15, arrive 745 to 8am for urine sample  Go ahead and pick up Azithromycin Zpak 5 day course, antibiotic. Today but start AFTER urine sample.  If still negative and still symptoms, then we would consider referral to Urologist for consult. Let me know. Or if resolved, then you're good.  Keep on Wegovy 1.7mg  weekly inj  You have lost 23 lbs in past 5 months  Recent Labs    01/17/22 0900  HGBA1C 6.1*   Diet Recommendations for Preventing Diabetes   Starchy (carb) foods include: Bread, rice, pasta, potatoes, corn, crackers, bagels, muffins, all baked goods.   FRUITS - LIMIT these HIGH sugar/carb fruits = Pineapple, Watermelon, Bananas - OKAY with these MEDIUM sugar/carb fruits = Citrus, Oranges, Grapes - PREFER these LOW sugar/carb fruits = Apples, Berries, Pears, Plums  Protein foods include: Meat, fish, poultry, eggs, dairy foods, and beans such as pinto and kidney beans (beans also provide carbohydrate).   1. Eat at least 3 meals and 1-2 snacks per day. Never go more than 4-5 hours while awake without eating.   2. Limit starchy foods to TWO per meal and ONE per snack. ONE portion of a starchy  food is equal to the following:   - ONE slice of bread (or its equivalent, such as half of a hamburger bun).   - 1/2 cup of a "scoopable" starchy food such as potatoes or rice.   - 1 OUNCE (28 grams) of starchy snacks (crackers or pretzels, look on label).   - 15 grams of carbohydrate as shown on food label.   3. Both lunch and dinner should include a protein food, a carb food, and vegetables.   - Obtain twice as many veg's as protein or carbohydrate foods for both lunch and dinner.   - Try to keep frozen veg's on hand for a quick vegetable serving.     - Fresh or frozen veg's are best.   4. Breakfast should always include protein.      Please schedule a Follow-up Appointment to:  Return in about 3 months (around 09/02/2022) for 3 month follow-up PreDM A1c Weight.  If you have any other questions or concerns, please feel free to call the office or send a message through MyChart. You may also schedule an earlier appointment if necessary.  Additionally, you may be receiving a survey about your experience at our office within a few days to 1 week by e-mail or mail. We value your feedback.  Saralyn Pilar, DO [facility], [facility]  Diabetes Mellitus and Nutrition, Adult When you have diabetes, or diabetes mellitus, it is very important to have healthy eating habits because your blood sugar (glucose) levels are greatly affected by what you eat and drink. Eating healthy foods in the right amounts, at about the same times every day, can help you: Manage your blood glucose. Lower your risk of heart disease. Improve your blood pressure. Reach or maintain a healthy weight. What can affect my meal plan? Every person with diabetes is different, and each person has different needs for a meal plan. Your health care provider may recommend that you work with a dietitian to make a meal plan that is best for you. Your meal plan may vary depending on factors such as: The calories you need. The medicines you take. Your weight. Your blood glucose, blood pressure, and cholesterol levels.  Your activity level. Other health conditions you have, such as heart or kidney disease. How do carbohydrates affect me? Carbohydrates, also called carbs, affect your blood glucose level more than any other type of food. Eating carbs raises the amount of glucose in your blood. It is important to know how many carbs you can safely have in each meal. This is different for every person. Your dietitian can help you calculate how many carbs you should have at each meal and for each snack. How does alcohol affect me? Alcohol can cause a decrease in blood glucose (hypoglycemia), especially if  you use insulin or take certain diabetes medicines by mouth. Hypoglycemia can be a life-threatening condition. Symptoms of hypoglycemia, such as sleepiness, dizziness, and confusion, are similar to symptoms of having too much alcohol. Do not drink alcohol if: Your health care provider tells you not to drink. You are pregnant, may be pregnant, or are planning to become pregnant. If you drink alcohol: Limit how much you have to: 0-1 drink a day for women. 0-2 drinks a day for men. Know how much alcohol is in your drink. In the [place] one drink equals one 12 oz bottle of beer (355 mL), one 5 oz glass of wine (148 mL), or one 1 oz glass of hard liquor (44 mL). Keep yourself hydrated with water, diet soda, or unsweetened iced tea. Keep in mind that regular soda, juice, and other mixers may contain a lot of sugar and must be counted as carbs. What are tips for following this plan?  Reading food labels Start by checking the serving size on the Nutrition Facts label of packaged foods and drinks. The number of calories and the amount of carbs, fats, and other nutrients listed on the label are based on one serving of the item. Many items contain more than one serving per package. Check the total grams (g) of carbs in one serving. Check the number of grams of saturated fats and trans fats in one serving. Choose foods that have a low amount or none of these fats. Check the number of milligrams (mg) of salt (sodium) in one serving. Most people should limit total sodium intake to less than 2,300 mg per day. Always check the nutrition information of foods labeled as "low-fat" or "nonfat." These foods may be higher in added sugar or refined carbs and should be avoided. Talk to your dietitian to identify your daily goals for nutrients listed on the label. Shopping Avoid buying canned, pre-made, or processed foods. These foods tend to be high in fat, sodium, and added sugar. Shop around the outside edge of the  grocery store. This is where you will most often find fresh fruits and vegetables, bulk grains, fresh meats, and fresh dairy products. Cooking Use low-heat cooking methods, such as baking, instead of high-heat cooking methods, such as deep frying. Cook using healthy oils, such as olive, canola, or sunflower oil. Avoid cooking with butter, cream, or high-fat meats. Meal planning Eat meals and snacks regularly, preferably at the same times every day. Avoid going long periods of time without eating. Eat foods that are high in fiber, such as fresh fruits, vegetables, beans, and whole grains. Eat 4-6 oz (112-168 g) of lean protein each day, such as lean meat, chicken, fish, eggs, or tofu. One ounce (oz) (28 g) of lean protein is equal to: 1 oz (28 g) of meat, chicken, or fish. 1 egg.  cup (62 g) of tofu. Eat some foods each day that contain  healthy fats, such as avocado, nuts, seeds, and fish. What foods should I eat? Fruits Berries. Apples. Oranges. Peaches. Apricots. Plums. Grapes. Mangoes. Papayas. Pomegranates. Kiwi. Cherries. Vegetables Leafy greens, including lettuce, spinach, kale, chard, collard greens, mustard greens, and cabbage. Beets. Cauliflower. Broccoli. Carrots. Green beans. Tomatoes. Peppers. Onions. Cucumbers. Brussels sprouts. Grains Whole grains, such as whole-wheat or whole-grain bread, crackers, tortillas, cereal, and pasta. Unsweetened oatmeal. Quinoa. Brown or wild rice. Meats and other proteins Seafood. Poultry without skin. Lean cuts of poultry and beef. Tofu. Nuts. Seeds. Dairy Low-fat or fat-free dairy products such as milk, yogurt, and cheese. The items listed above may not be a complete list of foods and beverages you can eat and drink. Contact a dietitian for more information. What foods should I avoid? Fruits Fruits canned with syrup. Vegetables Canned vegetables. Frozen vegetables with butter or cream sauce. Grains Refined white flour and flour products  such as bread, pasta, snack foods, and cereals. Avoid all processed foods. Meats and other proteins Fatty cuts of meat. Poultry with skin. Breaded or fried meats. Processed meat. Avoid saturated fats. Dairy Full-fat yogurt, cheese, or milk. Beverages Sweetened drinks, such as soda or iced tea. The items listed above may not be a complete list of foods and beverages you should avoid. Contact a dietitian for more information. Questions to ask a health care provider Do I need to meet with a certified diabetes care and education specialist? Do I need to meet with a dietitian? What number can I call if I have questions? When are the best times to check my blood glucose? Where to find more information: American Diabetes Association: diabetes.org Academy of Nutrition and Dietetics: eatright.[employer] of Diabetes and Digestive and Kidney Diseases: AmenCredit.is Association of Diabetes Care & Education Specialists: diabeteseducator.org Summary It is important to have healthy eating habits because your blood sugar (glucose) levels are greatly affected by what you eat and drink. It is important to use alcohol carefully. A healthy meal plan will help you manage your blood glucose and lower your risk of heart disease. Your health care provider may recommend that you work with a dietitian to make a meal plan that is best for you. This information is not intended to replace advice given to you by your health care provider. Make sure you discuss any questions you have with your health care provider. Document Revised: 05/09/2020 Document Reviewed: 05/09/2020 Elsevier Patient Education  [place].

## 2022-06-03 ENCOUNTER — Telehealth: Payer: Self-pay | Admitting: Family Medicine

## 2022-06-03 NOTE — Telephone Encounter (Signed)
Pt called saying his pharmacy told him that they need a PA for the [facility].

## 2022-06-03 NOTE — Telephone Encounter (Signed)
PA has already been submitted 

## 2022-06-05 LAB — GC/CHLAMYDIA PROBE AMP (~~LOC~~) NOT AT ARMC
Chlamydia: NEGATIVE
Comment: NEGATIVE
Comment: NORMAL
Neisseria Gonorrhea: NEGATIVE

## 2022-06-09 NOTE — Telephone Encounter (Signed)
A My Chart message has been sent to the patient letting him know the determination.

## 2022-06-09 NOTE — Telephone Encounter (Signed)
Pt is calling back requesting an update on PA for [facility].   Pt is requesting a call back.

## 2022-09-01 ENCOUNTER — Ambulatory Visit: Payer: 59 | Admitting: Family Medicine

## 2022-09-08 ENCOUNTER — Ambulatory Visit: Payer: BLUE CROSS/BLUE SHIELD | Admitting: Family Medicine

## 2022-09-16 ENCOUNTER — Encounter: Payer: Self-pay | Admitting: Family Medicine

## 2022-09-27 ENCOUNTER — Emergency Department
Admission: EM | Admit: 2022-09-27 | Discharge: 2022-09-27 | Discharge status: Against Medical Advice | Payer: 59 | Attending: Emergency Medicine | Admitting: Emergency Medicine

## 2022-09-27 DIAGNOSIS — Z5321 Procedure and treatment not carried out due to patient leaving prior to being seen by health care provider: Secondary | ICD-10-CM | POA: Insufficient documentation

## 2022-09-27 NOTE — ED Triage Notes (Signed)
Pt called for triage, no response. 

## 2022-09-27 NOTE — ED Notes (Signed)
Pt called for triage x's 3, no response 

## 2022-09-29 ENCOUNTER — Ambulatory Visit: Payer: Self-pay | Admitting: Family Medicine

## 2022-10-01 ENCOUNTER — Emergency Department
Admission: EM | Admit: 2022-10-01 | Discharge: 2022-10-01 | Disposition: A | Discharge status: Other Acute Inpatient Hospital | Payer: 59 | Attending: Emergency Medicine | Admitting: Emergency Medicine

## 2022-10-01 ENCOUNTER — Inpatient Hospital Stay
Admission: RE | Admit: 2022-10-01 | Discharge: 2022-10-03 | DRG: 885 | Disposition: A | Discharge status: Home / Self Care | Payer: 59 | Source: Intra-hospital | Attending: Psychiatry | Admitting: Psychiatry

## 2022-10-01 ENCOUNTER — Encounter: Payer: Self-pay | Admitting: Psychiatry

## 2022-10-01 ENCOUNTER — Encounter: Payer: Self-pay | Admitting: Emergency Medicine

## 2022-10-01 ENCOUNTER — Other Ambulatory Visit: Payer: Self-pay

## 2022-10-01 DIAGNOSIS — Z79899 Other long term (current) drug therapy: Secondary | ICD-10-CM | POA: Diagnosis not present

## 2022-10-01 DIAGNOSIS — Z886 Allergy status to analgesic agent status: Secondary | ICD-10-CM

## 2022-10-01 DIAGNOSIS — F200 Paranoid schizophrenia: Secondary | ICD-10-CM | POA: Diagnosis not present

## 2022-10-01 DIAGNOSIS — Z888 Allergy status to other drugs, medicaments and biological substances status: Secondary | ICD-10-CM

## 2022-10-01 DIAGNOSIS — F1490 Cocaine use, unspecified, uncomplicated: Secondary | ICD-10-CM | POA: Diagnosis present

## 2022-10-01 DIAGNOSIS — F29 Unspecified psychosis not due to a substance or known physiological condition: Secondary | ICD-10-CM | POA: Diagnosis present

## 2022-10-01 DIAGNOSIS — F22 Delusional disorders: Secondary | ICD-10-CM

## 2022-10-01 DIAGNOSIS — Z046 Encounter for general psychiatric examination, requested by authority: Secondary | ICD-10-CM | POA: Diagnosis present

## 2022-10-01 DIAGNOSIS — J302 Other seasonal allergic rhinitis: Secondary | ICD-10-CM | POA: Diagnosis present

## 2022-10-01 DIAGNOSIS — Z20822 Contact with and (suspected) exposure to covid-19: Secondary | ICD-10-CM | POA: Diagnosis present

## 2022-10-01 LAB — URINE DRUG SCREEN, QUALITATIVE (ARMC ONLY)
Amphetamines, Ur Screen: NOT DETECTED
Barbiturates, Ur Screen: NOT DETECTED
Benzodiazepine, Ur Scrn: NOT DETECTED
Cannabinoid 50 Ng, Ur ~~LOC~~: NOT DETECTED
Cocaine Metabolite,Ur ~~LOC~~: POSITIVE — AB
MDMA (Ecstasy)Ur Screen: NOT DETECTED
Methadone Scn, Ur: NOT DETECTED
Opiate, Ur Screen: NOT DETECTED
Phencyclidine (PCP) Ur S: NOT DETECTED
Tricyclic, Ur Screen: NOT DETECTED

## 2022-10-01 LAB — RESP PANEL BY RT-PCR (RSV, FLU A&B, COVID)  RVPGX2
Influenza A by PCR: NEGATIVE
Influenza B by PCR: NEGATIVE
Resp Syncytial Virus by PCR: NEGATIVE
SARS Coronavirus 2 by RT PCR: NEGATIVE

## 2022-10-01 LAB — COMPREHENSIVE METABOLIC PANEL
ALT: 43 U/L (ref 0–44)
AST: 32 U/L (ref 15–41)
Albumin: 3.8 g/dL (ref 3.5–5.0)
Alkaline Phosphatase: 60 U/L (ref 38–126)
Anion gap: 11 (ref 5–15)
BUN: 10 mg/dL (ref 6–20)
CO2: 22 mmol/L (ref 22–32)
Calcium: 9.1 mg/dL (ref 8.9–10.3)
Chloride: 103 mmol/L (ref 98–111)
Creatinine, Ser: 0.96 mg/dL (ref 0.61–1.24)
GFR, Estimated: >60 mL/min (ref >60)
Glucose, Bld: 97 mg/dL (ref 70–99)
Potassium: 3.6 mmol/L (ref 3.5–5.1)
Sodium: 136 mmol/L (ref 135–145)
Total Bilirubin: 0.8 mg/dL (ref 0.3–1.2)
Total Protein: 7.3 g/dL (ref 6.5–8.1)

## 2022-10-01 LAB — ETHANOL: Alcohol, Ethyl (B): <10 mg/dL (ref <10)

## 2022-10-01 LAB — CBC
HCT: 45 % (ref 39.0–52.0)
Hemoglobin: 15 g/dL (ref 13.0–17.0)
MCH: 29.1 pg (ref 26.0–34.0)
MCHC: 33.3 g/dL (ref 30.0–36.0)
MCV: 87.4 fL (ref 80.0–100.0)
Platelets: 331 10*3/uL (ref 150–400)
RBC: 5.15 MIL/uL (ref 4.22–5.81)
RDW: 13.9 % (ref 11.5–15.5)
WBC: 12.1 10*3/uL — ABNORMAL HIGH (ref 4.0–10.5)
nRBC: 0 % (ref 0.0–0.2)

## 2022-10-01 MED ORDER — TRAZODONE HCL 100 MG PO TABS
200.0000 mg | ORAL_TABLET | Freq: Every day | ORAL | Status: DC
  Administered 2022-10-01 – 2022-10-02 (×2): 200 mg via ORAL
  Filled 2022-10-01 (×2): qty 2

## 2022-10-01 MED ORDER — BUPROPION HCL ER (XL) 150 MG PO TB24: 300.0000 mg | ORAL_TABLET | Freq: Every morning | ORAL | Status: DC

## 2022-10-01 MED ORDER — BUPROPION HCL ER (XL) 150 MG PO TB24
300.0000 mg | ORAL_TABLET | Freq: Every morning | ORAL | Status: DC
  Administered 2022-10-02 – 2022-10-03 (×2): 300 mg via ORAL
  Filled 2022-10-01 (×2): qty 2

## 2022-10-01 MED ORDER — ACETAMINOPHEN 325 MG PO TABS: 650.0000 mg | ORAL_TABLET | Freq: Four times a day (QID) | ORAL | Status: DC | PRN

## 2022-10-01 MED ORDER — THIOTHIXENE 5 MG PO CAPS
10.0000 mg | ORAL_CAPSULE | Freq: Two times a day (BID) | ORAL | Status: DC
  Administered 2022-10-02 – 2022-10-03 (×3): 10 mg via ORAL
  Filled 2022-10-01 (×7): qty 2

## 2022-10-01 MED ORDER — TRAZODONE HCL 100 MG PO TABS: 200.0000 mg | ORAL_TABLET | Freq: Every day | ORAL | Status: DC

## 2022-10-01 MED ORDER — HYDROXYZINE HCL 25 MG PO TABS: 25.0000 mg | ORAL_TABLET | Freq: Every evening | ORAL | Status: DC | PRN

## 2022-10-01 MED ORDER — THIOTHIXENE 2 MG PO CAPS
10.0000 mg | ORAL_CAPSULE | Freq: Two times a day (BID) | ORAL | Status: DC
  Administered 2022-10-01: 10 mg via ORAL
  Filled 2022-10-01: qty 5
  Filled 2022-10-01: qty 2
  Filled 2022-10-01: qty 5

## 2022-10-01 MED ORDER — ALUM & MAG HYDROXIDE-SIMETH 200-200-20 MG/5ML PO SUSP: 30.0000 mL | ORAL | Status: DC | PRN

## 2022-10-01 MED ORDER — PAROXETINE HCL 20 MG PO TABS
20.0000 mg | ORAL_TABLET | Freq: Every day | ORAL | Status: DC
  Administered 2022-10-02: 20 mg via ORAL
  Filled 2022-10-01 (×2): qty 1

## 2022-10-01 MED ORDER — MAGNESIUM HYDROXIDE 400 MG/5ML PO SUSP: 30.0000 mL | Freq: Every day | ORAL | Status: DC | PRN

## 2022-10-01 MED ORDER — PAROXETINE HCL 20 MG PO TABS
20.0000 mg | ORAL_TABLET | Freq: Every day | ORAL | Status: DC
  Filled 2022-10-01: qty 1

## 2022-10-01 NOTE — ED Notes (Signed)
Pt to go downstairs to BMU after 1930 tonight per psych team.

## 2022-10-01 NOTE — ED Notes (Signed)
Psych team at bedside .

## 2022-10-01 NOTE — BH Assessment (Signed)
Comprehensive Clinical Assessment (CCA) Note  10/01/2022 Adam Cannon [id number]  Adam Cannon, 56 year old male who presents to [facility] ED involuntarily for treatment. Per triage note, Pt here under IVC with sheriff. Pt cuffed at this time. Pt calm and cooperative at this time. Pt placed in triage middle until pt can be triaged and placed in behavioral room.   During TTS assessment pt presents alert and oriented x 4, restless but cooperative, and mood-congruent with affect. The pt does not appear to be responding to internal or external stimuli. Neither is the pt presenting with any delusional thinking. Pt verified the information provided to triage RN.   Pt identifies his main complaint to be that he has not taken his meds for a couple of days and he relapsed using cocaine. Patient reports he was paranoid thinking someone stole his money. Patient reports he is normally compliant with his meds; however, he experienced a recent break up with a girlfriend which caused him to be depressed. Patient states he receives services from [facility] and he should have taken that time to talk to someone. Patient reports he has been clean since 2017. Pt denies using any other illicit substances and alcohol. Pt denies current SI/HI/AH/VH. Pt provided his sister, Darl Pikes as a collateral contact. Psych Team spoke to [facility] [phone]. Darl Pikes reports she noticed patient's bizarre behavior on 08/23/22 and it continued to worsen. She reports patient calling her early in the morning stating someone was in the home. She is very concerned about patient's well-being and safety. She reports patient is not taking his meds as prescribed.     Per Sallye Ober, NP, pt is recommended for inpatient psychiatric admission.  Chief Complaint:  Chief Complaint  Patient presents with   Paranoid    IVC    Visit Diagnosis: Paranoid schizophrenia    CCA Screening, Triage and Referral (STR)  Patient Reported Information How did you hear about  [place]? Family/Friend  Referral name: No data recorded Referral phone number: No data recorded  Whom do you see for routine medical problems? No data recorded Practice/Facility Name: No data recorded Practice/Facility Phone Number: No data recorded Name of Contact: No data recorded Contact Number: No data recorded Contact Fax Number: No data recorded Prescriber Name: No data recorded Prescriber Address (if known): No data recorded  What Is the Reason for Your Visit/Call Today? Patient reports he has not been taking meds and paranoid.  How Long Has This Been Causing You Problems? 1 wk - 1 month  What Do You Feel Would Help You the Most Today? Medication(s)   Have You Recently Been in Any Inpatient Treatment (Hospital/Detox/Crisis Center/28-Day Program)? No data recorded Name/Location of Program/Hospital:No data recorded How Long Were You There? No data recorded When Were You Discharged? No data recorded  Have You Ever Received Services From [facility] Before? No data recorded Who Do You See at [facility]? No data recorded  Have You Recently Had Any Thoughts About Hurting Yourself? No  Are You Planning to Commit Suicide/Harm Yourself At This time? No data recorded  Have you Recently Had Thoughts About Hurting Someone Karolee Ohs? No  Explanation: No data recorded  Have You Used Any Alcohol or Drugs in the Past 24 Hours? Yes  How Long Ago Did You Use Drugs or Alcohol? No data recorded What Did You Use and How Much? Cocaine   Do You Currently Have a Therapist/Psychiatrist? Yes  Name of Therapist/Psychiatrist: [facility]   Have You Been Recently Discharged  From Any Public relations account executive or Programs? No  Explanation of Discharge From Practice/Program: No data recorded    CCA Screening Triage Referral Assessment Type of Contact: Face-to-Face  Is this Initial or Reassessment? No data recorded Date Telepsych consult ordered in [facility]:  No data recorded Time Telepsych consult ordered in [facility]:   No data recorded  Patient Reported Information Reviewed? No data recorded Patient Left Without Being Seen? No data recorded Reason for Not Completing Assessment: No data recorded  Collateral Involvement: Kearney Hard (sister) [phone]   Does Patient Have a Court Appointed Legal Guardian? No data recorded Name and Contact of Legal Guardian: No data recorded If Minor and Not Living with Parent(s), Who has Custody? No data recorded Is CPS involved or ever been involved? No data recorded Is APS involved or ever been involved? No data recorded  Patient Determined To Be At Risk for Harm To Self or Others Based on Review of Patient Reported Information or Presenting Complaint? No data recorded Method: No data recorded Availability of Means: No data recorded Intent: No data recorded Notification Required: No data recorded Additional Information for Danger to Others Potential: No data recorded Additional Comments for Danger to Others Potential: No data recorded Are There Guns or Other Weapons in Your Home? No data recorded Types of Guns/Weapons: No data recorded Are These Weapons Safely Secured?                            No data recorded Who Could Verify You Are Able To Have These Secured: No data recorded Do You Have any Outstanding Charges, Pending Court Dates, Parole/Probation? No data recorded Contacted To Inform of Risk of Harm To Self or Others: No data recorded  Location of Assessment: [facility] ED   Does Patient Present under Involuntary Commitment? Yes  IVC Papers Initial File Date: No data recorded  [place] of Residence: [place]   Patient Currently Receiving the Following Services: Medication Management; IOP (Intensive Outpatient Program); Peer Support Services   Determination of Need: Emergent (2 hours)   Options For Referral: ED Visit; Inpatient Hospitalization; Medication Management     CCA Biopsychosocial Intake/Chief Complaint:  No data recorded Current  Symptoms/Problems: No data recorded  Patient Reported Schizophrenia/Schizoaffective Diagnosis in Past: Yes   Strengths: Patient is able to communicate and verbalize needs.  Preferences: No data recorded Abilities: No data recorded  Type of Services Patient Feels are Needed: No data recorded  Initial Clinical Notes/Concerns: No data recorded  Mental Health Symptoms Depression:   Difficulty Concentrating   Duration of Depressive symptoms:  Greater than two weeks   Mania:   None   Anxiety:    None   Psychosis:   None   Duration of Psychotic symptoms:  Greater than six months   Trauma:   None   Obsessions:   None   Compulsions:   N/A   Inattention:   N/A   Hyperactivity/Impulsivity:   N/A   Oppositional/Defiant Behaviors:   N/A   Emotional Irregularity:   N/A   Other Mood/Personality Symptoms:  No data recorded   Mental Status Exam Appearance and self-care  Stature:   Tall   Weight:   Average weight   Clothing:   Casual   Grooming:   Normal   Cosmetic use:   None   Posture/gait:   Normal   Motor activity:   Not Remarkable   Sensorium  Attention:   Normal   Concentration:  Normal   Orientation:   X5   Recall/memory:   Normal   Affect and Mood  Affect:   Anxious   Mood:   Anxious   Relating  Eye contact:   Normal   Facial expression:   Anxious   Attitude toward examiner:   Cooperative   Thought and Language  Speech flow:  Clear and Coherent; Pressured   Thought content:   Appropriate to Mood and Circumstances   Preoccupation:   None   Hallucinations:   None   Organization:  No data recorded  [employer] of Knowledge:   Average   Intelligence:   Average   Abstraction:   Functional   Judgement:   Impaired   Reality Testing:   Realistic   Insight:   Fair   Decision Making:   Normal   Social Functioning  Social Maturity:   Responsible   Social Judgement:   "Copy"   Stress  Stressors:   Relationship   Coping Ability:   Human resources officer Deficits:   Communication   Supports:   Family; Friends/Service system     Religion:    Leisure/Recreation:    Exercise/Diet:     CCA Employment/Education Employment/Work Situation: Employment / Work Situation Employment Situation: Employed  Education:     CCA Family/Childhood History Family and Relationship History: Family history Marital status: Single  Childhood History:     Child/Adolescent Assessment:     CCA Substance Use Alcohol/Drug Use: Alcohol / Drug Use Pain Medications: See PTA Prescriptions: See PTA Over the Counter: See PTA History of alcohol / drug use?: Yes Substance #1 Name of Substance 1: Cocaine                       ASAM's:  Six Dimensions of Multidimensional Assessment  Dimension 1:  Acute Intoxication and/or Withdrawal Potential:      Dimension 2:  Biomedical Conditions and Complications:      Dimension 3:  Emotional, Behavioral, or Cognitive Conditions and Complications:     Dimension 4:  Readiness to Change:     Dimension 5:  Relapse, Continued use, or Continued Problem Potential:     Dimension 6:  Recovery/Living Environment:     ASAM Severity Score:    ASAM Recommended Level of Treatment:     Substance use Disorder (SUD) Substance Use Disorder (SUD)  Checklist Symptoms of Substance Use: Continued use despite having a persistent/recurrent physical/psychological problem caused/exacerbated by use  Recommendations for Services/Supports/Treatments: Recommendations for Services/Supports/Treatments Recommendations For Services/Supports/Treatments: Medication Management, Inpatient Hospitalization, Detox  DSM5 Diagnoses: Patient Active Problem List   Diagnosis Date Noted   Paranoid schizophrenia (HCC) 10/01/2022   Pre-diabetes 01/20/2022   Pure hypercholesterolemia 01/20/2022   Obesity (BMI 30.0-34.9) 01/17/2022    Eustachian tube dysfunction, bilateral 12/15/2019    Patient Centered Plan: Patient is on the following Treatment Plan(s):     Referrals to Alternative Service(s): Referred to Alternative Service(s):   Place:   Date:   Time:    Referred to Alternative Service(s):   Place:   Date:   Time:    Referred to Alternative Service(s):   Place:   Date:   Time:    Referred to Alternative Service(s):   Place:   Date:   Time:      @BHCOLLABOFCARE @  Margit Batte R , Counselor, LCAS-A

## 2022-10-01 NOTE — BH Assessment (Signed)
Patient is to be admitted to [facility] BMU tonight 10/01/22 after 7:30pm pending negative Covid results by Dr. Toni Amend.  Attending Physician will be Dr.  Toni Amend .   Patient has been assigned to room 320, by [facility] Charge Nurse, Wylene Men.     ER staff is aware of the admission: Drinda Butts, ER Secretary   Dr. Arnoldo Morale, ER MD  [place], Patient's Nurse  Ethelene Browns, Patient Access.

## 2022-10-01 NOTE — Consult Note (Signed)
[facility] Face-to-Face Psychiatry Consult   Reason for Consult:  paranoia Referring Physician:  Sidney Ace Patient Identification: Adam Cannon MRN:  [id number] Principal Diagnosis: Paranoid schizophrenia (HCC) Diagnosis:  Principal Problem:   Paranoid schizophrenia (HCC)   Total Time spent with patient: 45 minutes  Subjective:   Adam Cannon is a 56 y.o. male patient admitted with paranoid thoughts .  HPI: Patient presents to the ED via law enforcement under IVC, taken out by his sister.  On evaluation, patient is almost inappropriately happy appearing.  He is smiling, talkative, somewhat pressured.  He states that he realized he missed a couple of days of medication because he was thinking that there was a girl named Adam Cannon that came in and took a bunch of money and his meds.  Patient is now saying that he believes none of this was true and he was just being paranoid.  Patient admits that he relapsed 1 time 2 weeks ago on cocaine.  When confronted that his UDS is positive for cocaine at this time, patient states that he remembers he probably used cocaine a few days ago.  Patient has history of schizophrenia.  He states that he gets services at [facility].  He denies other drug use aside from cocaine.  Denies alcohol use.  He states he has been "clean since 2017" but a few weeks ago he learned that a woman that he had a painful break-up with got married.  Patient denies suicidal or homicidal thoughts.  Denies auditory or visual hallucinations.  Patient states that he lives with his brother  Collateral from patient's sister, Kearney Hard, [phone]: Sister states that patient has been paranoid for over 1 month.  He will call her at all hours and tell her that people are trying to get into the house and that people have stolen from him, etc.  She says this is getting worse.  The first incident was November 4.  She states that when he called her today she felt that she needed to petition the magistrate to get an  involuntary commitment.  She states that he has not been taking his medications.   Writer tried several times to get in touch with providers at [facility] for collateral, as patient states that he sees Dr. Georjean Mode and Erie Noe (therapist). Unable to obtain collateral; no one available. Release of information was signed by patient and faxed to [facility] by Misty Stanley, Diplomatic Services operational officer.   Patient meets criteria for psychiatric admission.   Past Psychiatric History: Per chart review, patient was diagnosed with schizophrenia and hospitalized at this facility for same in 2013.    Risk to Self:   Risk to Others:   Prior Inpatient Therapy:   Prior Outpatient Therapy:    Past Medical History:  Past Medical History:  Diagnosis Date   Environmental and seasonal allergies     Past Surgical History:  Procedure Laterality Date   NO PAST SURGERIES     Family History:  Family History  Problem Relation Age of Onset   Hypertension Mother    Breast cancer Mother    Hypertension Father    Healthy Sister    Healthy Brother    Hypertension Maternal Uncle    Heart attack Maternal Uncle    Prostate cancer Neg Hx    Family Psychiatric  History:  Social History:  Social History   Substance and Sexual Activity  Alcohol Use No     Social History   Substance and Sexual Activity  Drug Use Never  Social History   Socioeconomic History   Marital status: Single    Spouse name: Not on file   Number of children: Not on file   Years of education: Not on file   Highest education level: Associate degree: occupational, Scientist, product/process development, or vocational program  Occupational History   Not on file  Tobacco Use   Smoking status: Never   Smokeless tobacco: Never  Vaping Use   Vaping Use: Never used  Substance and Sexual Activity   Alcohol use: No   Drug use: Never   Sexual activity: Yes  Other Topics Concern   Not on file  Social History Narrative   Not on file   Social Determinants of Health   Financial Resource Strain:  Low Risk  (11/01/2018)   Overall Financial Resource Strain (CARDIA)    Difficulty of Paying Living Expenses: Not hard at all  Food Insecurity: No Food Insecurity (11/01/2018)   Hunger Vital Sign    Worried About Running Out of Food in the Last Year: Never true    Ran Out of Food in the Last Year: Never true  Transportation Needs: No Transportation Needs (11/01/2018)   PRAPARE - Administrator, Civil Service (Medical): No    Lack of Transportation (Non-Medical): No  Physical Activity: Sufficiently Active (11/01/2018)   Exercise Vital Sign    Days of Exercise per Week: 3 days    Minutes of Exercise per Session: 60 min  Stress: Not on file  Social Connections: Not on file   Additional Social History:    Allergies:   Allergies  Allergen Reactions   Mucinex [Guaifenesin Er] Nausea And Vomiting    Allergic to Liquid form only per patient on 12/15/2019 call   Aspirin Rash    Remote past, has taken recently and had no hives (10/2018)    Labs:  Results for orders placed or performed during the hospital encounter of 10/01/22 (from the past 48 hour(s))  Comprehensive metabolic panel     Status: None   Collection Time: 10/01/22  9:45 AM  Result Value Ref Range   Sodium 136 135 - 145 mmol/L   Potassium 3.6 3.5 - 5.1 mmol/L   Chloride 103 98 - 111 mmol/L   CO2 22 22 - 32 mmol/L   Glucose, Bld 97 70 - 99 mg/dL    Comment: Glucose reference range applies only to samples taken after fasting for at least 8 hours.   BUN 10 6 - 20 mg/dL   Creatinine, Ser 2.89 0.61 - 1.24 mg/dL   Calcium 9.1 8.9 - 79.1 mg/dL   Total Protein 7.3 6.5 - 8.1 g/dL   Albumin 3.8 3.5 - 5.0 g/dL   AST 32 15 - 41 U/L   ALT 43 0 - 44 U/L   Alkaline Phosphatase 60 38 - 126 U/L   Total Bilirubin 0.8 0.3 - 1.2 mg/dL   GFR, Estimated >50 >41 mL/min    Comment: (NOTE) Calculated using the CKD-EPI Creatinine Equation (2021)    Anion gap 11 5 - 15    Comment: Performed at [facility], [address] [place], [place] [address]  Ethanol     Status: None   Collection Time: 10/01/22  9:45 AM  Result Value Ref Range   Alcohol, Ethyl (B) <10 <10 mg/dL    Comment: (NOTE) Lowest detectable limit for serum alcohol is 10 mg/dL.  For medical purposes only. Performed at [facility], [address] [place], [place] [address]  cbc     Status: Abnormal   Collection Time: 10/01/22  9:45 AM  Result Value Ref Range   WBC 12.1 (H) 4.0 - 10.5 K/uL   RBC 5.15 4.22 - 5.81 MIL/uL   Hemoglobin 15.0 13.0 - 17.0 g/dL   HCT 11.9 14.7 - 82.9 %   MCV 87.4 80.0 - 100.0 fL   MCH 29.1 26.0 - 34.0 pg   MCHC 33.3 30.0 - 36.0 g/dL   RDW 56.2 13.0 - 86.5 %   Platelets 331 150 - 400 K/uL   nRBC 0.0 0.0 - 0.2 %    Comment: Performed at [facility], [address], [place], [place] [address]  Urine Drug Screen, Qualitative     Status: Abnormal   Collection Time: 10/01/22  9:45 AM  Result Value Ref Range   Tricyclic, Ur Screen NONE DETECTED NONE DETECTED   Amphetamines, Ur Screen NONE DETECTED NONE DETECTED   MDMA (Ecstasy)Ur Screen NONE DETECTED NONE DETECTED   Cocaine Metabolite,Ur [place] POSITIVE (A) NONE DETECTED   Opiate, Ur Screen NONE DETECTED NONE DETECTED   Phencyclidine (PCP) Ur S NONE DETECTED NONE DETECTED   Cannabinoid 50 Ng, Ur [place] NONE DETECTED NONE DETECTED   Barbiturates, Ur Screen NONE DETECTED NONE DETECTED   Benzodiazepine, Ur Scrn NONE DETECTED NONE DETECTED   Methadone Scn, Ur NONE DETECTED NONE DETECTED    Comment: (NOTE) Tricyclics + metabolites, urine    Cutoff 1000 ng/mL Amphetamines + metabolites, urine  Cutoff 1000 ng/mL MDMA (Ecstasy), urine              Cutoff 500 ng/mL Cocaine Metabolite, urine          Cutoff 300 ng/mL Opiate + metabolites, urine        Cutoff 300 ng/mL Phencyclidine (PCP), urine         Cutoff 25 ng/mL Cannabinoid, urine                 Cutoff 50 ng/mL Barbiturates + metabolites, urine  Cutoff 200 ng/mL Benzodiazepine, urine               Cutoff 200 ng/mL Methadone, urine                   Cutoff 300 ng/mL  The urine drug screen provides only a preliminary, unconfirmed analytical test result and should not be used for non-medical purposes. Clinical consideration and professional judgment should be applied to any positive drug screen result due to possible interfering substances. A more specific alternate chemical method must be used in order to obtain a confirmed analytical result. Gas chromatography / mass spectrometry (GC/MS) is the preferred confirm atory method. Performed at [facility], [address] [place], [place] [address]     Current Facility-Administered Medications  Medication Dose Route Frequency Provider Last Rate Last Admin   [START ON 10/02/2022] buPROPion (WELLBUTRIN XL) 24 hr tablet 300 mg  300 mg Oral q morning Gabriel Cirri F, NP       hydrOXYzine (ATARAX) tablet 25-50 mg  25-50 mg Oral QHS PRN Vanetta Mulders, NP       PARoxetine (PAXIL) tablet 20 mg  20 mg Oral QHS Gabriel Cirri F, NP       thiothixene (NAVANE) capsule 10 mg  10 mg Oral BID Gabriel Cirri F, NP   10 mg at 10/01/22 1508   traZODone (DESYREL) tablet 200 mg  200 mg Oral QHS Vanetta Mulders, NP  Current Outpatient Medications  Medication Sig Dispense Refill   buPROPion (WELLBUTRIN XL) 300 MG 24 hr tablet Take 300 mg by mouth every morning.     ciprofloxacin (CIPRO) 500 MG tablet Take 500 mg by mouth 2 (two) times daily.     hydrOXYzine (ATARAX) 25 MG tablet Take 25-50 mg by mouth at bedtime as needed.     PARoxetine (PAXIL) 20 MG tablet Take 20 mg by mouth at bedtime.     tamsulosin (FLOMAX) 0.4 MG CAPS capsule Take 0.4 mg by mouth daily.     thiothixene (NAVANE) 10 MG capsule Take 10 mg by mouth 2 (two) times daily.     traZODone (DESYREL) 100 MG tablet Take 200 mg by mouth at bedtime.     azithromycin (ZITHROMAX Z-PAK) 250 MG tablet Take 2 tabs (500mg  total) on Day 1. Take 1 tab (250mg ) daily  for next 4 days. (Patient not taking: Reported on 10/01/2022) 6 tablet 0   buPROPion (WELLBUTRIN XL) 150 MG 24 hr tablet Take 150 mg by mouth every morning. (Patient not taking: Reported on 10/01/2022)     naproxen sodium (ALEVE) 220 MG tablet Take 220 mg by mouth.     WEGOVY 1.7 MG/0.75ML SOAJ Inject 1.7 mg into the skin once a week. (Patient not taking: Reported on 10/01/2022) 3 mL 2    Musculoskeletal: Strength & Muscle Tone: within normal limits Gait & Station: normal Patient leans: N/A    Psychiatric Specialty Exam:  Presentation  General Appearance: Casual  Eye Contact:Good  Speech:Clear and Coherent; Pressured  Speech Volume:Increased  Handedness:Right   Mood and Affect  Mood:-- (elevated)  Affect:Congruent   Thought Process  Thought Processes:Coherent  Descriptions of Associations:Intact  Orientation:Full (Time, Place and Person)  Thought Content:Paranoid Ideation  History of Schizophrenia/Schizoaffective disorder:Yes  Duration of Psychotic Symptoms:Greater than six months  Hallucinations:Hallucinations: None  Ideas of Reference:Delusions; Paranoia  Suicidal Thoughts:Suicidal Thoughts: No  Homicidal Thoughts:Homicidal Thoughts: No   Sensorium  Memory:Immediate Fair  Judgment:Poor  Insight:Fair   Executive Functions  Concentration:Fair  Attention Span:Fair  Recall:Fair  Fund of Knowledge:Fair  Language:Fair   Psychomotor Activity  Psychomotor Activity:Psychomotor Activity: Increased   Assets  Assets:Communication Skills; Desire for Improvement; Housing; Resilience   Sleep  Sleep:Sleep: Fair   Physical Exam: Physical Exam Vitals and nursing note reviewed.  HENT:     Head: Normocephalic.  Eyes:     General:        Right eye: No discharge.        Left eye: No discharge.  Cardiovascular:     Rate and Rhythm: Normal rate.  Pulmonary:     Effort: Pulmonary effort is normal.  Musculoskeletal:        General: Normal  range of motion.     Cervical back: Normal range of motion.  Skin:    General: Skin is dry.  Neurological:     Mental Status: He is oriented to person, place, and time.  Psychiatric:        Attention and Perception: Attention normal.        Mood and Affect: Mood is elated.        Speech: Speech normal.        Behavior: Behavior normal.        Thought Content: Thought content is paranoid.        Cognition and Memory: Cognition normal.        Judgment: Judgment is impulsive.    Review of Systems  Constitutional: Negative.  HENT: Negative.    Respiratory: Negative.    Musculoskeletal: Negative.   Psychiatric/Behavioral:  Positive for substance abuse. Negative for hallucinations (paranoia) and suicidal ideas.    Blood pressure (!) 146/104, pulse (!) 113, temperature 98.3 F (36.8 C), temperature source Oral, resp. rate 20, height 5\' 10"  (1.778 m), weight 86.6 kg, SpO2 98 %. Body mass index is 27.41 kg/m.  Treatment Plan Summary: Daily contact with patient to assess and evaluate symptoms and progress in treatment and Medication management. Re-started PTA psych meds. Waiting for [facility] to call with collateral. Reviewed with EDP  Disposition: Recommend psychiatric Inpatient admission when medically cleared.  , NP 10/01/2022 4:10 PM

## 2022-10-01 NOTE — ED Notes (Signed)
Lunch placed at side. 

## 2022-10-01 NOTE — Progress Notes (Signed)
Patient admitted from [facility] - ED, report received form Thayer Ohm, Charity fundraiser. Patient calm and pleasant during assessment denying SI/HI/AVH. Pt stated that he had missed a few days of his medication and wasn't feeling right so his sister brought him to the hospital. Pt stated he usually takes his medications as prescribed but just missed a few. Pt skin assessment completed with Cleo, no abnormalities found, no contraband found. Patient oriented to the unit and his room. Pt given education, support, and encouragement to be active in his treatment plan. Pt being monitored Q 15 minutes for safety per unit protocol, remains safe on the unit

## 2022-10-01 NOTE — ED Notes (Signed)
IVC PENDING  CONSULT ?

## 2022-10-01 NOTE — Tx Team (Signed)
Initial Treatment Plan 10/01/2022 8:35 PM REINHART SAULTERS BDH:[id number]    PATIENT STRESSORS: Medication change or noncompliance   Substance abuse     PATIENT STRENGTHS: Ability for insight  Supportive family/friends    PATIENT IDENTIFIED PROBLEMS: Medication non-compliance  Paranoia  Substance abuse                 DISCHARGE CRITERIA:  Improved stabilization in mood, thinking, and/or behavior Motivation to continue treatment in a less acute level of care  PRELIMINARY DISCHARGE PLAN: Outpatient therapy Return to previous living arrangement  PATIENT/FAMILY INVOLVEMENT: This treatment plan has been presented to and reviewed with the patient, Adam Cannon. The patient has been given the opportunity to ask questions and make suggestions.  Elmyra Ricks, RN 10/01/2022, 8:35 PM

## 2022-10-01 NOTE — ED Triage Notes (Signed)
Patient brought in by sheriff IVCd by sister for paranoia. Paperwork states patient has been talking about someone coming and taking his belongings and has become very protective over his things. Reports patient has not been taking his medications for the past week. Patient states he simply forgot to take his medications. Denies any SI/HI,

## 2022-10-01 NOTE — ED Notes (Signed)
First nurse note:   Pt here under IVC with sheriff. Pt cuffed at this time. Pt calm and cooperative at this time. Pt placed in triage middle until pt can be triaged and placed in behavioral room.

## 2022-10-01 NOTE — ED Notes (Addendum)
Pt signed release of info paperwork for psych team to communicate with [facility]. Pt given warm blanket. Pt appreciative.

## 2022-10-01 NOTE — [facility] Group Notes (Signed)
[facility] Group Notes:  (Nursing/MHT/Case Management/Adjunct)  Date:  10/01/2022  Time:  8:28 PM  Type of Therapy:   Wrap up  Participation Level:  Did Not Attend   Mayra Neer 10/01/2022, 8:28 PM

## 2022-10-01 NOTE — ED Notes (Signed)
Called pt's sister Kearney Hard at [phone] at pt's request. Darl Pikes told to call Berna Spare to notify him pt will not be back to work and is unsure for how long yet; done as pt requested. Explained at pt's request that if Berna Spare does not answer his phone pt would like Darl Pikes to drive over to visit Berna Spare and let him know in person. Berna Spare' # per pt: [phone].

## 2022-10-01 NOTE — ED Provider Notes (Signed)
[facility] Provider Note    Event Date/Time   First MD Initiated Contact with Patient 10/01/22 [phone]     (approximate)   History   Paranoid (IVC/)   HPI  Adam Cannon is a 56 y.o. male who presents under IVC because of paranoia.  Patient tells me that he has a history of schizophrenia anxiety depression bipolar disorder and is typically stable on his medications however he got busy over the last 2 days and missed his medications.  This morning he woke up and placed money and felt that someone had stolen it.  He also felt that someone was in the room.  Denies actually hallucinating.  He denies SI or HI.  He has no other medical complaints.  IVC paperwork says that patient thought that someone stole $3000 from him was throwing things and acting very paranoid.  Patient does admit to using cocaine 2 weeks ago after he relapsed, but nothing since that time.     Past Medical History:  Diagnosis Date   Environmental and seasonal allergies     Patient Active Problem List   Diagnosis Date Noted   Pre-diabetes 01/20/2022   Pure hypercholesterolemia 01/20/2022   Obesity (BMI 30.0-34.9) 01/17/2022   Eustachian tube dysfunction, bilateral 12/15/2019     Physical Exam  Triage Vital Signs: ED Triage Vitals  Enc Vitals Group     BP 10/01/22 0934 (!) 146/104     Pulse Rate 10/01/22 0934 (!) 113     Resp 10/01/22 0934 20     Temp 10/01/22 0934 98.3 F (36.8 C)     Temp Source 10/01/22 0934 Oral     SpO2 10/01/22 0934 98 %     Weight 10/01/22 0947 191 lb (86.6 kg)     Height 10/01/22 0947 5\' 10"  (1.778 m)     Head Circumference --      Peak Flow --      Pain Score 10/01/22 0944 0     Pain Loc --      Pain Edu? --      Excl. in [place]? --     Most recent vital signs: Vitals:   10/01/22 0934  BP: (!) 146/104  Pulse: (!) 113  Resp: 20  Temp: 98.3 F (36.8 C)  SpO2: 98%     General: Awake, no distress.  CV:  Good peripheral perfusion.  Resp:  Normal  effort.  Abd:  No distention.  Neuro:             Awake, Alert, Oriented x 3  Other:  Patient's speech is slightly pressured but he is otherwise calm and cooperative   ED Results / Procedures / Treatments  Labs (all labs ordered are listed, but only abnormal results are displayed) Labs Reviewed  CBC - Abnormal; Notable for the following components:      Result Value   WBC 12.1 (*)    All other components within normal limits  COMPREHENSIVE METABOLIC PANEL  ETHANOL  URINE DRUG SCREEN, QUALITATIVE ([facility] ONLY)     EKG     RADIOLOGY    PROCEDURES:  Critical Care performed: No  Procedures   MEDICATIONS ORDERED IN ED: Medications - No data to display   IMPRESSION / MDM / [place] / ED COURSE  I reviewed the triage vital signs and the nursing notes.  Patient's presentation is most consistent with exacerbation of chronic illness.  Differential diagnosis includes, but is not limited to, decompensated psychosis secondary to medication noncompliance, substance-induced psychosis, intoxication, withdrawal  The patient is a 56 year old male who presents under IVC due to paranoid behavior.  Tells me he has been off his medications for the last 2 days because he got busy and forgot to take them.  Does admit to feeling somewhat paranoid however he denies SI HI.  Tells me he is otherwise stable on his medications typically.  Patient's speech is somewhat pressured however he is otherwise calm cooperative and alert and oriented.  Per IVC paperwork patient has been very paranoid throwing things and not taking his medications.  Plan to consult psychiatry to obtain collateral and determine disposition.  Will uphold IVC at this time.  The patient has been placed in psychiatric observation due to the need to provide a safe environment for the patient while obtaining psychiatric consultation and evaluation, as well as ongoing medical and medication  management to treat the patient's condition.  The patient has been placed under full IVC at this time.        FINAL CLINICAL IMPRESSION(S) / ED DIAGNOSES   Final diagnoses:  Paranoia (HCC)     Rx / DC Orders   ED Discharge Orders     None        Note:  This document was prepared using Dragon voice recognition software and may include unintentional dictation errors.   Georga Hacking, MD 10/01/22 1002

## 2022-10-02 DIAGNOSIS — F200 Paranoid schizophrenia: Secondary | ICD-10-CM | POA: Diagnosis not present

## 2022-10-02 DIAGNOSIS — F1490 Cocaine use, unspecified, uncomplicated: Secondary | ICD-10-CM | POA: Insufficient documentation

## 2022-10-02 MED ORDER — TAMSULOSIN HCL 0.4 MG PO CAPS
0.4000 mg | ORAL_CAPSULE | Freq: Every day | ORAL | Status: DC
  Administered 2022-10-02: 0.4 mg via ORAL
  Filled 2022-10-02 (×2): qty 1

## 2022-10-02 NOTE — H&P (Signed)
Psychiatric Admission Assessment Adult  Patient Identification: Adam Cannon MRN:  [id number] Date of Evaluation:  10/02/2022 Chief Complaint:  Psychosis [facility]) [F29] Principal Diagnosis: Paranoid schizophrenia ([place]) Diagnosis:  Principal Problem:   Paranoid schizophrenia ([place]) Active Problems:   Psychosis ([place])   Cocaine use  History of Present Illness: Follow-up 56 year old man with a past history of schizophrenia.  Patient was admitted to the hospital after presenting to the emergency room under IVC filed by family.  Family reports that for the past several weeks he has been showing more signs of paranoia.  He had gotten agitated at home believing someone had stolen some money from him.  Seemed to at times be responding to internal stimuli.  On interview today the patient states that his mistake was that he had missed his medication for 2 days.  He says that earlier this week there were 2 days on which he did not take his antipsychotic medication.  He said that he realizes now that when he did that he became paranoid and believed that someone had sold stolen some money from him.  He says that he now realizes that is not true that he simply misplaced the money.  Patient denies that he is hallucinating.  Denies any suicidal or homicidal thought.  Patient does admit that he had recently had a relapse onto cocaine.  Claims his last use was a week ago.  Drug screen was positive for cocaine.  He is regularly followed up at [facility] for medication management Associated Signs/Symptoms: Depression Symptoms:   None (Hypo) Manic Symptoms:  Delusions, Anxiety Symptoms:   None Psychotic Symptoms:  Delusions, Paranoia, PTSD Symptoms: Negative Total Time spent with patient: 45 minutes  Past Psychiatric History: Patient has a past history of schizophrenia.  It has been 10 years since the last time he was in the hospital.  No history of suicide attempts no history of violence.  Past history of substance abuse  and states that he had been sober for years but recently had a relapse in the past month  Is the patient at risk to self? No.  Has the patient been a risk to self in the past 6 months? No.  Has the patient been a risk to self within the distant past? No.  Is the patient a risk to others? No.  Has the patient been a risk to others in the past 6 months? Yes.    Has the patient been a risk to others within the distant past? No.   [place] Scale:  Flowsheet Row Admission (Current) from 10/01/2022 in [place] Most recent reading at 10/01/2022  8:23 PM ED from 10/01/2022 in [place] Most recent reading at 10/01/2022  9:44 AM  C-SSRS RISK CATEGORY No Risk No Risk        Prior Inpatient Therapy: Yes.   If yes, describe 10 years since his last hospitalization Prior Outpatient Therapy: Yes.   If yes, describe regular client at [facility]  Alcohol Screening: 1. How often do you have a drink containing alcohol?: Never 2. How many drinks containing alcohol do you have on a typical day when you are drinking?: 1 or 2 3. How often do you have six or more drinks on one occasion?: Never AUDIT-C Score: 0 4. How often during the last year have you found that you were not able to stop drinking once you had started?: Never 5. How often during the last year have you failed to  do what was normally expected from you because of drinking?: Never 6. How often during the last year have you needed a first drink in the morning to get yourself going after a heavy drinking session?: Never 7. How often during the last year have you had a feeling of guilt of remorse after drinking?: Never 8. How often during the last year have you been unable to remember what happened the night before because you had been drinking?: Never 9. Have you or someone else been injured as a result of your drinking?: No 10. Has a relative or friend or a Cannon or another health  worker been concerned about your drinking or suggested you cut down?: No Alcohol Use Disorder Identification Test Final Score (AUDIT): 0 Substance Abuse History in the last 12 months:  Yes.   Consequences of Substance Abuse: Seems most likely that the cocaine use was related to his recent worsening of symptoms just as much as being off of medicine Previous Psychotropic Medications: Yes  Psychological Evaluations: Yes  Past Medical History:  Past Medical History:  Diagnosis Date   Environmental and seasonal allergies     Past Surgical History:  Procedure Laterality Date   NO PAST SURGERIES     Family History:  Family History  Problem Relation Age of Onset   Hypertension Mother    Breast cancer Mother    Hypertension Father    Healthy Sister    Healthy Brother    Hypertension Maternal Uncle    Heart attack Maternal Uncle    Prostate cancer Neg Hx    Family Psychiatric  History: None reported Tobacco Screening:  Social History   Tobacco Use  Smoking Status Never  Smokeless Tobacco Never    BH Tobacco Counseling     Are you interested in Tobacco Cessation Medications?  N/A, patient does not use tobacco products Counseled patient on smoking cessation:  N/A, patient does not use tobacco products Reason Tobacco Screening Not Completed: No value filed.       Social History:  Social History   Substance and Sexual Activity  Alcohol Use No     Social History   Substance and Sexual Activity  Drug Use Never    Additional Social History:                           Allergies:   Allergies  Allergen Reactions   Mucinex [Guaifenesin Er] Nausea And Vomiting    Allergic to Liquid form only per patient on 12/15/2019 call   Aspirin Rash    Remote past, has taken recently and had no hives (10/2018)   Lab Results:  Results for orders placed or performed during the hospital encounter of 10/01/22 (from the past 48 hour(s))  Comprehensive metabolic panel     Status:  None   Collection Time: 10/01/22  9:45 AM  Result Value Ref Range   Sodium 136 135 - 145 mmol/L   Potassium 3.6 3.5 - 5.1 mmol/L   Chloride 103 98 - 111 mmol/L   CO2 22 22 - 32 mmol/L   Glucose, Bld 97 70 - 99 mg/dL    Comment: Glucose reference range applies only to samples taken after fasting for at least 8 hours.   BUN 10 6 - 20 mg/dL   Creatinine, Ser 0.96 0.61 - 1.24 mg/dL   Calcium 9.1 8.9 - 10.3 mg/dL   Total Protein 7.3 6.5 - 8.1 g/dL   Albumin 3.8 3.5 -  5.0 g/dL   AST 32 15 - 41 U/L   ALT 43 0 - 44 U/L   Alkaline Phosphatase 60 38 - 126 U/L   Total Bilirubin 0.8 0.3 - 1.2 mg/dL   GFR, Estimated >60 >60 mL/min    Comment: (NOTE) Calculated using the CKD-EPI Creatinine Equation (2021)    Anion gap 11 5 - 15    Comment: Performed at [facility], [address] [place], [place] [address]  Ethanol     Status: None   Collection Time: 10/01/22  9:45 AM  Result Value Ref Range   Alcohol, Ethyl (B) <10 <10 mg/dL    Comment: (NOTE) Lowest detectable limit for serum alcohol is 10 mg/dL.  For medical purposes only. Performed at [facility], [address] [place], [place] [address]   cbc     Status: Abnormal   Collection Time: 10/01/22  9:45 AM  Result Value Ref Range   WBC 12.1 (H) 4.0 - 10.5 K/uL   RBC 5.15 4.22 - 5.81 MIL/uL   Hemoglobin 15.0 13.0 - 17.0 g/dL   HCT 45.0 39.0 - 52.0 %   MCV 87.4 80.0 - 100.0 fL   MCH 29.1 26.0 - 34.0 pg   MCHC 33.3 30.0 - 36.0 g/dL   RDW 13.9 11.5 - 15.5 %   Platelets 331 150 - 400 K/uL   nRBC 0.0 0.0 - 0.2 %    Comment: Performed at [facility], [address], [place], [place] [address]  Urine Drug Screen, Qualitative     Status: Abnormal   Collection Time: 10/01/22  9:45 AM  Result Value Ref Range   Tricyclic, Ur Screen NONE DETECTED NONE DETECTED   Amphetamines, Ur Screen NONE DETECTED NONE DETECTED   MDMA (Ecstasy)Ur Screen NONE DETECTED NONE DETECTED   Cocaine Metabolite,Ur [place] POSITIVE (A)  NONE DETECTED   Opiate, Ur Screen NONE DETECTED NONE DETECTED   Phencyclidine (PCP) Ur S NONE DETECTED NONE DETECTED   Cannabinoid 50 Ng, Ur [address] NONE DETECTED NONE DETECTED   Barbiturates, Ur Screen NONE DETECTED NONE DETECTED   Benzodiazepine, Ur Scrn NONE DETECTED NONE DETECTED   Methadone Scn, Ur NONE DETECTED NONE DETECTED    Comment: (NOTE) Tricyclics + metabolites, urine    Cutoff 1000 ng/mL Amphetamines + metabolites, urine  Cutoff 1000 ng/mL MDMA (Ecstasy), urine              Cutoff 500 ng/mL Cocaine Metabolite, urine          Cutoff 300 ng/mL Opiate + metabolites, urine        Cutoff 300 ng/mL Phencyclidine (PCP), urine         Cutoff 25 ng/mL Cannabinoid, urine                 Cutoff 50 ng/mL Barbiturates + metabolites, urine  Cutoff 200 ng/mL Benzodiazepine, urine              Cutoff 200 ng/mL Methadone, urine                   Cutoff 300 ng/mL  The urine drug screen provides only a preliminary, unconfirmed analytical test result and should not be used for non-medical purposes. Clinical consideration and professional judgment should be applied to any positive drug screen result due to possible interfering substances. A more specific alternate chemical method must be used in order to obtain a confirmed analytical result. Gas chromatography / mass spectrometry (GC/MS) is the preferred confirm atory  method. Performed at [facility], [address] [place], [place] [address]   Resp panel by RT-PCR (RSV, Flu A&B, Covid) Anterior Nasal Swab     Status: None   Collection Time: 10/01/22  9:46 AM   Specimen: Anterior Nasal Swab  Result Value Ref Range   SARS Coronavirus 2 by RT PCR NEGATIVE NEGATIVE    Comment: (NOTE) SARS-CoV-2 target nucleic acids are NOT DETECTED.  The SARS-CoV-2 RNA is generally detectable in upper respiratory specimens during the acute phase of infection. The lowest concentration of SARS-CoV-2 viral copies this assay can detect is 138  copies/mL. A negative result does not preclude SARS-Cov-2 infection and should not be used as the sole basis for treatment or other patient management decisions. A negative result may occur with  improper specimen collection/handling, submission of specimen other than nasopharyngeal swab, presence of viral mutation(s) within the areas targeted by this assay, and inadequate number of viral copies(<138 copies/mL). A negative result must be combined with clinical observations, patient history, and epidemiological information. The expected result is Negative.  Fact Sheet for Patients:  EntrepreneurPulse.com.au  Fact Sheet for Healthcare Providers:  IncredibleEmployment.be  This test is no t yet approved or cleared by the [place] FDA and  has been authorized for detection and/or diagnosis of SARS-CoV-2 by FDA under an Emergency Use Authorization (EUA). This EUA will remain  in effect (meaning this test can be used) for the duration of the COVID-19 declaration under Section 564(b)(1) of the Act, 21 U.S.C.section 360bbb-3(b)(1), unless the authorization is terminated  or revoked sooner.       Influenza A by PCR NEGATIVE NEGATIVE   Influenza B by PCR NEGATIVE NEGATIVE    Comment: (NOTE) The Xpert Xpress SARS-CoV-2/FLU/RSV plus assay is intended as an aid in the diagnosis of influenza from Nasopharyngeal swab specimens and should not be used as a sole basis for treatment. Nasal washings and aspirates are unacceptable for Xpert Xpress SARS-CoV-2/FLU/RSV testing.  Fact Sheet for Patients: EntrepreneurPulse.com.au  Fact Sheet for Healthcare Providers: IncredibleEmployment.be  This test is not yet approved or cleared by the [place] FDA and has been authorized for detection and/or diagnosis of SARS-CoV-2 by FDA under an Emergency Use Authorization (EUA). This EUA will remain in effect (meaning this test  can be used) for the duration of the COVID-19 declaration under Section 564(b)(1) of the Act, 21 U.S.C. section 360bbb-3(b)(1), unless the authorization is terminated or revoked.     Resp Syncytial Virus by PCR NEGATIVE NEGATIVE    Comment: (NOTE) Fact Sheet for Patients: EntrepreneurPulse.com.au  Fact Sheet for Healthcare Providers: IncredibleEmployment.be  This test is not yet approved or cleared by the [place] FDA and has been authorized for detection and/or diagnosis of SARS-CoV-2 by FDA under an Emergency Use Authorization (EUA). This EUA will remain in effect (meaning this test can be used) for the duration of the COVID-19 declaration under Section 564(b)(1) of the Act, 21 U.S.C. section 360bbb-3(b)(1), unless the authorization is terminated or revoked.  Performed at [facility], [address] [place],  [address]     Blood Alcohol level:  Lab Results  Component Value Date   [facility] <10 [id number]    Metabolic Disorder Labs:  Lab Results  Component Value Date   HGBA1C 6.1 (H) 01/17/2022   MPG 128 01/17/2022   No results found for: "PROLACTIN" Lab Results  Component Value Date   CHOL 229 (H) 01/17/2022   TRIG 65 01/17/2022   HDL 51 01/17/2022  CHOLHDL 4.5 01/17/2022   LDLCALC 162 (H) 01/17/2022    Current Medications: Current Facility-Administered Medications  Medication Dose Route Frequency Provider Last Rate Last Admin   acetaminophen (TYLENOL) tablet 650 mg  650 mg Oral Q6H PRN Sherlon Handing, NP       alum & mag hydroxide-simeth (MAALOX/MYLANTA) 200-200-20 MG/5ML suspension 30 mL  30 mL Oral Q4H PRN Sherlon Handing, NP       buPROPion (WELLBUTRIN XL) 24 hr tablet 300 mg  300 mg Oral q morning Waldon Merl F, NP   300 mg at 10/02/22 [id number]   hydrOXYzine (ATARAX) tablet 25-50 mg  25-50 mg Oral QHS PRN Sherlon Handing, NP       magnesium hydroxide (MILK OF MAGNESIA) suspension 30 mL  30  mL Oral Daily PRN Sherlon Handing, NP       PARoxetine (PAXIL) tablet 20 mg  20 mg Oral QHS Waldon Merl F, NP       tamsulosin (FLOMAX) capsule 0.4 mg  0.4 mg Oral QPC supper Bettina Warn, [name], MD       thiothixene (NAVANE) capsule 10 mg  10 mg Oral BID Waldon Merl F, NP       traZODone (DESYREL) tablet 200 mg  200 mg Oral QHS Waldon Merl F, NP   200 mg at 10/01/22 2250   PTA Medications: Medications Prior to Admission  Medication Sig Dispense Refill Last Dose   azithromycin (ZITHROMAX Z-PAK) 250 MG tablet Take 2 tabs (500mg  total) on Day 1. Take 1 tab (250mg ) daily for next 4 days. (Patient not taking: Reported on 10/01/2022) 6 tablet 0    buPROPion (WELLBUTRIN XL) 150 MG 24 hr tablet Take 150 mg by mouth every morning. (Patient not taking: Reported on 10/01/2022)      buPROPion (WELLBUTRIN XL) 300 MG 24 hr tablet Take 300 mg by mouth every morning.      ciprofloxacin (CIPRO) 500 MG tablet Take 500 mg by mouth 2 (two) times daily.      hydrOXYzine (ATARAX) 25 MG tablet Take 25-50 mg by mouth at bedtime as needed.      naproxen sodium (ALEVE) 220 MG tablet Take 220 mg by mouth.      PARoxetine (PAXIL) 20 MG tablet Take 20 mg by mouth at bedtime.      tamsulosin (FLOMAX) 0.4 MG CAPS capsule Take 0.4 mg by mouth daily.      thiothixene (NAVANE) 10 MG capsule Take 10 mg by mouth 2 (two) times daily.      traZODone (DESYREL) 100 MG tablet Take 200 mg by mouth at bedtime.      WEGOVY 1.7 MG/0.75ML SOAJ Inject 1.7 mg into the skin once a week. (Patient not taking: Reported on 10/01/2022) 3 mL 2     Musculoskeletal: Strength & Muscle Tone: within normal limits Gait & Station: normal Patient leans: N/A            Psychiatric Specialty Exam:  Presentation  General Appearance:  Casual  Eye Contact: Good  Speech: Clear and Coherent; Pressured  Speech Volume: Increased  Handedness: Right   Mood and Affect  Mood: --  (elevated)  Affect: Congruent   Thought Process  Thought Processes: Coherent  Duration of Psychotic Symptoms:N/A Past Diagnosis of Schizophrenia or Psychoactive disorder: Yes  Descriptions of Associations:Intact  Orientation:Full (Time, Place and Person)  Thought Content:Paranoid Ideation  Hallucinations:Hallucinations: None  Ideas of Reference:Delusions; Paranoia  Suicidal Thoughts:Suicidal Thoughts: No  Homicidal Thoughts:Homicidal Thoughts: No  Sensorium  Memory: Immediate Fair  Judgment: Poor  Insight: Fair   Chartered certified accountant: Fair  Attention Span: Fair  Recall: [employer] of Knowledge: Fair  Language: Fair   Psychomotor Activity  Psychomotor Activity: Psychomotor Activity: Increased   Assets  Assets: Communication Skills; Desire for Improvement; Housing; Resilience   Sleep  Sleep: Sleep: Fair    Physical Exam: Physical Exam Vitals and nursing note reviewed.  Constitutional:      Appearance: Normal appearance.  HENT:     Head: Normocephalic and atraumatic.     Mouth/Throat:     Pharynx: Oropharynx is clear.  Eyes:     Pupils: Pupils are equal, round, and reactive to light.  Cardiovascular:     Rate and Rhythm: Normal rate and regular rhythm.  Pulmonary:     Effort: Pulmonary effort is normal.     Breath sounds: Normal breath sounds.  Abdominal:     General: Abdomen is flat.     Palpations: Abdomen is soft.  Musculoskeletal:        General: Normal range of motion.  Skin:    General: Skin is warm and dry.  Neurological:     General: No focal deficit present.     Mental Status: He is alert. Mental status is at baseline.  Psychiatric:        Attention and Perception: Attention normal.        Mood and Affect: Mood normal.        Speech: Speech normal.        Behavior: Behavior normal.        Thought Content: Thought content normal.        Cognition and Memory: Cognition normal.    Review of  Systems  Constitutional: Negative.   HENT: Negative.    Eyes: Negative.   Respiratory: Negative.    Cardiovascular: Negative.   Gastrointestinal: Negative.   Musculoskeletal: Negative.   Skin: Negative.   Neurological: Negative.   Psychiatric/Behavioral: Negative.     Blood pressure 127/79, pulse (!) 102, temperature 98.8 F (37.1 C), temperature source Oral, resp. rate 18, height 5\' 10"  (1.778 m), weight 74.8 kg, SpO2 98 %. Body mass index is 23.68 kg/m.  Treatment Plan Summary: Daily contact with patient to assess and evaluate symptoms and progress in treatment, Medication management, and Plan reviewed medication with patient.  He remains on Navane as his primary antipsychotic and appears to have tolerated it well without any signs of tardive dyskinesia.  Restarted medicines as we believe they are being prescribed outside the hospital.  Engage in individual and group assessment and have treatment team tomorrow.  If he continues to be free of psychotic symptoms likely can be discharged by the weekend  Observation Level/Precautions:  15 minute checks  Laboratory:  Chemistry Profile  Psychotherapy:    Medications:    Consultations:    Discharge Concerns:    Estimated LOS:  Other:     Physician Treatment Plan for Primary Diagnosis: Paranoid schizophrenia (HCC) Long Term Goal(s): Improvement in symptoms so as ready for discharge  Short Term Goals: Ability to verbalize feelings will improve and Ability to demonstrate self-control will improve  Physician Treatment Plan for Secondary Diagnosis: Principal Problem:   Paranoid schizophrenia (HCC) Active Problems:   Psychosis (HCC)   Cocaine use  Long Term Goal(s): Improvement in symptoms so as ready for discharge  Short Term Goals: Compliance with prescribed medications will improve and Ability to identify triggers associated with substance abuse/mental health  issues will improve  I certify that inpatient services furnished can  reasonably be expected to improve the patient's condition.    Alethia Berthold, MD 12/14/202311:36 AM

## 2022-10-02 NOTE — [facility] Counselor (Signed)
Adult Comprehensive Assessment  Patient ID: Adam Cannon, male   DOB: [birth year], 56 y.o.   MRN: [id number]  Information Source: Information source: Patient  Current Stressors:  Patient states their primary concerns and needs for treatment are:: "I missed two days taking three of my meds." Pt shares that his sister was worried about him and they brought him here. Patient states their goals for this hospitilization and ongoing recovery are:: "Get better track of my meds." Educational / Learning stressors: None reported Employment / Job issues: None reported Family Relationships: None reported Surveyor, quantity / Lack of resources (include bankruptcy): None reported Housing / Lack of housing: None reported Physical health (include injuries & life threatening diseases): None reported Social relationships: None reported Substance abuse: History of substance use and a recent slip on last Wednesday (09/24/22) Bereavement / Loss: None reported  Living/Environment/Situation:  Living Arrangements: Other relatives Living conditions (as described by patient or guardian): Pt lives in a family home. Who else lives in the home?: Pt and his older brother How long has patient lived in current situation?: "Off and on over 40 years." What is atmosphere in current home: Comfortable, Other (Comment) ("It's ok, not the best but ok.")  Family History:  Marital status: Single Are you sexually active?: No What is your sexual orientation?: Heterosexual Has your sexual activity been affected by drugs, alcohol, medication, or emotional stress?: N/A Does patient have children?: No  Childhood History:  By whom was/is the patient raised?: Both parents Additional childhood history information: Pt states that his parents separated. Children remained with mother. Description of patient's relationship with caregiver when they were a child: "Dad did the discipline." "Good, very good." Patient's description of current  relationship with people who raised him/her: Both parents are deceased. How were you disciplined when you got in trouble as a child/adolescent?: Pt states that his dad did the disciplining. He denies any abuse. Does patient have siblings?: Yes Number of Siblings: 2 (Older brother and older sister. Pt is the youngest child.) Description of patient's current relationship with siblings: "It's ok, it's not the best but it's ok." Did patient suffer any verbal/emotional/physical/sexual abuse as a child?: No Did patient suffer from severe childhood neglect?: No Has patient ever been sexually abused/assaulted/raped as an adolescent or adult?: No Was the patient ever a victim of a crime or a disaster?: No Witnessed domestic violence?: No Has patient been affected by domestic violence as an adult?: No  Education:  Highest grade of school patient has completed: Development worker, international aid in Therapist, music. 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 He also acknowledges a slip on 09/24/22. He reports a history of cocaine use with smoking $50 worth when using. Last use on  09/24/22 per pt.  However, UDS positive for cocaine. If attempted suicide, did drugs/alcohol play a role in this?:  (N/A) Alcohol/Substance Abuse Treatment Hx: Relapse prevention program If yes, describe treatment: Pt completed a year of treatment through [facility]. Has alcohol/substance abuse ever caused legal problems?: No  Social Support System:   Patient's Community Support System: Good Describe Community Support System: "I'm enrolled at [employer]. My counselor there she is part of my support. And I have other people, my sister, and I attend meetings." Type of faith/religion: "I believe in God." How does patient's faith help to cope with current illness?: "Just pray, pray a lot and talk to God."  Leisure/Recreation:   Do You Have Hobbies?: Yes Leisure and Hobbies: "I like to watch sports, I love sports."  Strengths/Needs:   What is the patient's perception of their strengths?: "Evaluating the situation." Patient states they can use these personal strengths during their treatment to contribute to their recovery: He endorses that he feels this can be helpful in his recovery. Patient states these barriers may affect/interfere with their treatment: Pt denies any barriers. Patient states these barriers may affect their return to the community: Pt denies any barriers. Other important information patient would like considered in planning for their treatment: N/A  Discharge Plan:   Currently receiving community mental health services: Yes (From Whom) ([facility], therapist Erie Noe and Dr. Georjean Mode for medication management.) Patient states concerns and preferences for aftercare planning are: Pt plans to continue outpatient treatment through [facility]. Patient states they will know when they are safe and ready for discharge when: "The way I feel now is comfortable." Does patient have access to transportation?: Yes Does patient have financial barriers related to discharge medications?: No Patient description of  barriers related to discharge medications: N/A Will patient be returning to same living situation after discharge?: Yes  Summary/Recommendations:   Summary and Recommendations (to be completed by the evaluator): Patient is a 56 year old, single, male from [place], [place] [facility] He shared that he came to the hospital because he had not taken three of his medications for approximately two days. Pt explained that his sister was worried about him and therefore brought him to the hospital. He expressed that since he is here, he would like to "get better track of my (his) medication." However, per chart review pt brought in under IVC by his family. Pt lives in a family home with his older brother and plans to return there upon discharge. 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 Pt denied any history of trauma or abuse. He shared a history of cocaine use with a recent slip on 09/24/22. Pt stated that he typically smokes about $50 worth of cocaine when he does use. Although last use reported as 09/24/22, pt UDS positive for cocaine on 10/01/22. Previously did treatment through [facility] in [place], [place]. Pt currently receives outpatient services through [facility], where he sees [facility] for therapy and Dr. Georjean Mode for medication management. Upon discharge pt plans t continue with his current outpatient provider. Recommendations include crisis stabilization, therapeutic milieu, encourage group attendance and participation, medication management for mood stabilization and development of comprehensive sobriety/mental wellness plan.  Glenis Smoker. 10/02/2022

## 2022-10-02 NOTE — Group Note (Signed)
[facility] LCSW Group Therapy Note   Group Date: 10/02/2022 Start Time: 1300 End Time: 1400   Type of Therapy/Topic:  Group Therapy:  Emotion Regulation  Participation Level:  Minimal   Mood: Euthymic   Description of Group:    The purpose of this group is to assist patients in learning to regulate negative emotions and experience positive emotions. Patients will be guided to discuss ways in which they have been vulnerable to their negative emotions. These vulnerabilities will be juxtaposed with experiences of positive emotions or situations, and patients challenged to use positive emotions to combat negative ones. Special emphasis will be placed on coping with negative emotions in conflict situations, and patients will process healthy conflict resolution skills.  Therapeutic Goals: Patient will identify two positive emotions or experiences to reflect on in order to balance out negative emotions:  Patient will label two or more emotions that they find the most difficult to experience:  Patient will be able to demonstrate positive conflict resolution skills through discussion or role plays:   Summary of Patient Progress:   Patient was present for the entirety of group session. Patient participated in opening and closing remarks. However, patient did not contribute at all to the topic of discussion despite encouraged participation.      Therapeutic Modalities:   Cognitive Behavioral Therapy Feelings Identification Dialectical Behavioral Therapy   Corky Crafts, [place]

## 2022-10-02 NOTE — Plan of Care (Signed)
D: Patient alert and oriented. Patient denies pain. Patient denies anxiety and depression. Patient denies SI/HI/AVH. Patient isolative to room during shift with exception to coming out for meals and medication.  A: Scheduled medications administered to patient, per MD orders.  Support and encouragement provided to patient.  Q15 minute safety checks maintained.   R: Patient compliant with medication administration and treatment plan. No adverse drug reactions noted. Patient remains safe on the unit at this time. Problem: Education: Goal: Knowledge of [place] [facility] Education information/materials will improve Outcome: Progressing Goal: Verbalization of understanding the information provided will improve Outcome: Progressing   Problem: Health Behavior/Discharge Planning: Goal: Identification of resources available to assist in meeting health care needs will improve Outcome: Progressing Goal: Compliance with treatment plan for underlying cause of condition will improve Outcome: Progressing   Problem: Safety: Goal: Periods of time without injury will increase Outcome: Progressing

## 2022-10-02 NOTE — Progress Notes (Signed)
Recreation Therapy Notes   Date: 10/02/2022  Time: 9:45 am     Location: Craft room   Behavioral response: Appropriate  Intervention Topic:  Coping skills   Discussion/Intervention:  Group content on today was focused on coping skills. The group defined what coping skills are and when they normally use coping skills. Individuals described how they normally cope with thing and the coping skills they normally use. Patients expressed why it is important to cope with things and how not coping with things can affect you. The group participated in the intervention "Exploring coping skills" where they had a chance to test new coping skills they could use in the future.  Clinical Observations/Feedback: Patient came to group and defined coping skills as anything you can use to help yourself handle a situation crisis. He identified his support groups, clearing mental space, talking with friends, meeting, reading as coping skills he uses. Individual was social with peers and staff while participating in the intervention.    Adam Cannon LRT/CTRS         [name] 10/02/2022 1:14 PM

## 2022-10-02 NOTE — [facility] Suicide Risk Assessment (Signed)
[facility] Admission Suicide Risk Assessment   Nursing information obtained from:  Patient Demographic factors:  NA Current Mental Status:  NA Loss Factors:  NA Historical Factors:  NA Risk Reduction Factors:  NA  Total Time spent with patient: 45 minutes Principal Problem: Paranoid schizophrenia (HCC) Diagnosis:  Principal Problem:   Paranoid schizophrenia (HCC) Active Problems:   Psychosis (HCC)   Cocaine use  Subjective Data: Patient seen and chart reviewed.  56 year old man sent to the hospital under IVC because of paranoia.  No report of suicidal behavior or thinking.  Patient today presents as lucid and clear and denies psychotic symptoms.  Denies any suicidal thought.  Denies mood symptoms.  Continued Clinical Symptoms:  Alcohol Use Disorder Identification Test Final Score (AUDIT): 0 The "Alcohol Use Disorders Identification Test", Guidelines for Use in Primary Care, Second Edition.  World Science writer [facility]). Score between 0-7:  no or low risk or alcohol related problems. Score between 8-15:  moderate risk of alcohol related problems. Score between 16-19:  high risk of alcohol related problems. Score 20 or above:  warrants further diagnostic evaluation for alcohol dependence and treatment.   CLINICAL FACTORS:   Alcohol/Substance Abuse/Dependencies Schizophrenia:   Paranoid or undifferentiated type   Musculoskeletal: Strength & Muscle Tone: within normal limits Gait & Station: normal Patient leans: N/A  Psychiatric Specialty Exam:  Presentation  General Appearance:  Casual  Eye Contact: Good  Speech: Clear and Coherent; Pressured  Speech Volume: Increased  Handedness: Right   Mood and Affect  Mood: -- (elevated)  Affect: Congruent   Thought Process  Thought Processes: Coherent  Descriptions of Associations:Intact  Orientation:Full (Time, Place and Person)  Thought Content:Paranoid Ideation  History of Schizophrenia/Schizoaffective  disorder:Yes  Duration of Psychotic Symptoms:Greater than six months  Hallucinations:Hallucinations: None  Ideas of Reference:Delusions; Paranoia  Suicidal Thoughts:Suicidal Thoughts: No  Homicidal Thoughts:Homicidal Thoughts: No   Sensorium  Memory: Immediate Fair  Judgment: Poor  Insight: Fair   Chartered certified accountant: Fair  Attention Span: Fair  Recall: [employer] of Knowledge: Fair  Language: Fair   Psychomotor Activity  Psychomotor Activity: Psychomotor Activity: Increased   Assets  Assets: Communication Skills; Desire for Improvement; Housing; Resilience   Sleep  Sleep: Sleep: Fair    Physical Exam: Physical Exam Vitals and nursing note reviewed.  Constitutional:      Appearance: Normal appearance.  HENT:     Head: Normocephalic and atraumatic.     Mouth/Throat:     Pharynx: Oropharynx is clear.  Eyes:     Pupils: Pupils are equal, round, and reactive to light.  Cardiovascular:     Rate and Rhythm: Normal rate and regular rhythm.  Pulmonary:     Effort: Pulmonary effort is normal.     Breath sounds: Normal breath sounds.  Abdominal:     General: Abdomen is flat.     Palpations: Abdomen is soft.  Musculoskeletal:        General: Normal range of motion.  Skin:    General: Skin is warm and dry.  Neurological:     General: No focal deficit present.     Mental Status: He is alert. Mental status is at baseline.  Psychiatric:        Attention and Perception: Attention normal.        Mood and Affect: Mood normal.        Speech: Speech normal.        Behavior: Behavior is cooperative.  Thought Content: Thought content normal.        Cognition and Memory: Cognition normal.        Judgment: Judgment normal.    Review of Systems  Constitutional: Negative.   HENT: Negative.    Eyes: Negative.   Respiratory: Negative.    Cardiovascular: Negative.   Gastrointestinal: Negative.   Musculoskeletal: Negative.    Skin: Negative.   Neurological: Negative.   Psychiatric/Behavioral: Negative.     Blood pressure 127/79, pulse (!) 102, temperature 98.8 F (37.1 C), temperature source Oral, resp. rate 18, height 5\' 10"  (1.778 m), weight 74.8 kg, SpO2 98 %. Body mass index is 23.68 kg/m.   COGNITIVE FEATURES THAT CONTRIBUTE TO RISK:  None    SUICIDE RISK:   Minimal: No identifiable suicidal ideation.  Patients presenting with no risk factors but with morbid ruminations; may be classified as minimal risk based on the severity of the depressive symptoms  PLAN OF CARE: Continue current medication.  Ongoing assessment including treatment team assessment.  Review of symptoms prior to discharge planning  I certify that inpatient services furnished can reasonably be expected to improve the patient's condition.   , MD 10/02/2022, 11:35 AM

## 2022-10-02 NOTE — [facility] Suicide Risk Assessment (Signed)
[facility] INPATIENT:  Family/Significant Other Suicide Prevention Education  Suicide Prevention Education:  Patient Refusal for Family/Significant Other Suicide Prevention Education: The patient Adam Cannon has refused to provide written consent for family/significant other to be provided Family/Significant Other Suicide Prevention Education during admission and/or prior to discharge.  Physician notified.  SPE completed with pt, as pt refused to consent to family contact. SPI pamphlet provided to pt and pt was encouraged to share information with support network, ask questions, and talk about any concerns relating to SPE. Pt denies access to guns/firearms and verbalized understanding of information provided. Mobile Crisis information also provided to pt.  Glenis Smoker 10/02/2022, 2:23 PM

## 2022-10-03 DIAGNOSIS — F200 Paranoid schizophrenia: Secondary | ICD-10-CM | POA: Diagnosis not present

## 2022-10-03 MED ORDER — PAROXETINE HCL 20 MG PO TABS: 20.0000 mg | ORAL_TABLET | Freq: Every day | ORAL | 0 refills | Status: DC

## 2022-10-03 MED ORDER — TRAZODONE HCL 100 MG PO TABS: 200.0000 mg | ORAL_TABLET | Freq: Every day | ORAL | 0 refills | Status: DC

## 2022-10-03 MED ORDER — THIOTHIXENE 10 MG PO CAPS: 10.0000 mg | ORAL_CAPSULE | Freq: Two times a day (BID) | ORAL | 0 refills | Status: DC

## 2022-10-03 MED ORDER — BUPROPION HCL ER (XL) 300 MG PO TB24: 300.0000 mg | ORAL_TABLET | Freq: Every morning | ORAL | 0 refills | Status: DC

## 2022-10-03 MED ORDER — HYDROXYZINE HCL 25 MG PO TABS: 25.0000 mg | ORAL_TABLET | Freq: Every evening | ORAL | 0 refills | Status: DC | PRN

## 2022-10-03 MED ORDER — TAMSULOSIN HCL 0.4 MG PO CAPS: 0.4000 mg | ORAL_CAPSULE | Freq: Every day | ORAL | 0 refills | Status: DC

## 2022-10-03 NOTE — Plan of Care (Signed)
Pt denies anxiety/depression at this time. Pt denies SI/HI/AVH or pain at this time. Pt is calm and cooperative. Pt is medication compliant. Pt provided with support and encouragement. Pt monitored q15 minutes for safety per unit policy. Plan of care ongoing.   Problem: Education: Goal: Knowledge of General Education information will improve Description: Including pain rating scale, medication(s)/side effects and non-pharmacologic comfort measures Outcome: Progressing   Problem: Coping: Goal: Level of anxiety will decrease Outcome: Progressing   

## 2022-10-03 NOTE — Progress Notes (Signed)
  [facility] Adult Case Management Discharge Plan :  Will you be returning to the same living situation after discharge:  Yes,  pt plans to return home upon discharge. At discharge, do you have transportation home?: Yes,  pt is making arrangements with family. If these fall through, CSW will assist with transportation arrangements.  Do you have the ability to pay for your medications: No.  Release of information consent forms completed and in the chart;  Patient's signature needed at discharge.  Patient to Follow up at:  Follow-up Information     [facility] Follow up.   Why: Your hospital follow up appointment is scheduled for Monday, 10/06/22 at 11:00AM. Thanks! Contact information: [address] [place] [address] [phone]                 Next level of care provider has access to [facility] Link:no  Safety Planning and Suicide Prevention discussed: Yes,  SPE completed with pt.      Has patient been referred to the Quitline?: N/A patient is not a smoker  Patient has been referred for addiction treatment: Yes  Glenis Smoker, LCSW 10/03/2022, 10:04 AM

## 2022-10-03 NOTE — Discharge Summary (Signed)
Physician Discharge Summary Note  Patient:  Adam Cannon is an 56 y.o., male MRN:  [id number] DOB:  [birth year] Patient phone:  [phone] (home)  Patient address:   [place] [address],  Total Time spent with patient: 30 minutes  Date of Admission:  10/01/2022 Date of Discharge: 10/03/2022  Reason for Admission: Patient was admitted to the hospital after being petitioned by family for paranoid agitated behavior in the context of medicine noncompliance and cocaine use  Principal Problem: Paranoid schizophrenia [facility]) Discharge Diagnoses: Principal Problem:   Paranoid schizophrenia ([place]) Active Problems:   Psychosis ([place])   Cocaine use   Past Psychiatric History: Patient has a past history of chronic psychosis usually under good control.  History of substance abuse with recent relapse  Past Medical History:  Past Medical History:  Diagnosis Date   Environmental and seasonal allergies     Past Surgical History:  Procedure Laterality Date   NO PAST SURGERIES     Family History:  Family History  Problem Relation Age of Onset   Hypertension Mother    Breast cancer Mother    Hypertension Father    Healthy Sister    Healthy Brother    Hypertension Maternal Uncle    Heart attack Maternal Uncle    Prostate cancer Neg Hx    Family Psychiatric  History: See previous Social History:  Social History   Substance and Sexual Activity  Alcohol Use No     Social History   Substance and Sexual Activity  Drug Use Never    Social History   Socioeconomic History   Marital status: Single    Spouse name: Not on file   Number of children: Not on file   Years of education: Not on file   Highest education level: Associate degree: occupational, Hotel manager, or vocational program  Occupational History   Not on file  Tobacco Use   Smoking status: Never   Smokeless tobacco: Never  Vaping Use   Vaping Use: Never used  Substance and Sexual Activity   Alcohol use:  No   Drug use: Never   Sexual activity: Yes  Other Topics Concern   Not on file  Social History Narrative   Not on file   Social Determinants of Health   Financial Resource Strain: Low Risk  (11/01/2018)   Overall Financial Resource Strain (CARDIA)    Difficulty of Paying Living Expenses: Not hard at all  Food Insecurity: No Food Insecurity (10/01/2022)   Hunger Vital Sign    Worried About Running Out of Food in the Last Year: Never true    [place] in the Last Year: Never true  Transportation Needs: No Transportation Needs (10/01/2022)   PRAPARE - Hydrologist (Medical): No    Lack of Transportation (Non-Medical): No  Physical Activity: Sufficiently Active (11/01/2018)   Exercise Vital Sign    Days of Exercise per Week: 3 days    Minutes of Exercise per Session: 60 min  Stress: Not on file  Social Connections: Not on file    Hospital Course: Admitted to psychiatric unit.  15-minute checks continued.  Patient has shown no dangerous aggressive or violent behavior.  He has been pleasant and cooperative with medication.  Completely denies psychotic symptoms.  He has met with representatives from [facility] and with the full treatment team and agreed to follow-up appointments at [facility].  Prescriptions will be provided for his usual medicine.  Psychoeducation and  supportive counseling provided.  At this point no longer meets commitment criteria and can be safely discharged home.  Physical Findings: AIMS:  , ,  ,  ,    CIWA:    COWS:     Musculoskeletal: Strength & Muscle Tone: within normal limits Gait & Station: normal Patient leans: N/A   Psychiatric Specialty Exam:  Presentation  General Appearance:  Casual  Eye Contact: Good  Speech: Clear and Coherent; Pressured  Speech Volume: Increased  Handedness: Right   Mood and Affect  Mood: -- (elevated)  Affect: Congruent   Thought Process  Thought  Processes: Coherent  Descriptions of Associations:Intact  Orientation:Full (Time, Place and Person)  Thought Content:Paranoid Ideation  History of Schizophrenia/Schizoaffective disorder:Yes  Duration of Psychotic Symptoms:Greater than six months  Hallucinations:No data recorded Ideas of Reference:Delusions; Paranoia  Suicidal Thoughts:No data recorded Homicidal Thoughts:No data recorded  Sensorium  Memory: Immediate Fair  Judgment: Poor  Insight: Fair   Materials engineer: Fair  Attention Span: Fair  Recall: [employer] of Knowledge: Fair  Language: Fair   Psychomotor Activity  Psychomotor Activity:No data recorded  Assets  Assets: Armed forces logistics/support/administrative officer; Desire for Improvement; Housing; Resilience   Sleep  Sleep:No data recorded   Physical Exam: Physical Exam Vitals reviewed.  Constitutional:      Appearance: Normal appearance.  HENT:     Head: Normocephalic and atraumatic.     Mouth/Throat:     Pharynx: Oropharynx is clear.  Eyes:     Pupils: Pupils are equal, round, and reactive to light.  Cardiovascular:     Rate and Rhythm: Normal rate and regular rhythm.  Pulmonary:     Effort: Pulmonary effort is normal.     Breath sounds: Normal breath sounds.  Abdominal:     General: Abdomen is flat.     Palpations: Abdomen is soft.  Musculoskeletal:        General: Normal range of motion.  Skin:    General: Skin is warm and dry.  Neurological:     General: No focal deficit present.     Mental Status: He is alert. Mental status is at baseline.  Psychiatric:        Attention and Perception: Attention normal.        Mood and Affect: Mood normal.        Speech: Speech normal.        Behavior: Behavior is cooperative.        Thought Content: Thought content normal.        Cognition and Memory: Cognition normal.        Judgment: Judgment normal.    Review of Systems  Constitutional: Negative.   HENT: Negative.    Eyes:  Negative.   Respiratory: Negative.    Cardiovascular: Negative.   Gastrointestinal: Negative.   Musculoskeletal: Negative.   Skin: Negative.   Neurological: Negative.   Psychiatric/Behavioral: Negative.     Blood pressure 113/88, pulse (!) 107, temperature 98.8 F (37.1 C), temperature source Oral, resp. rate 20, height _0  (1.778 m), weight 74.8 kg, SpO2 99 %. Body mass index is 23.68 kg/m.   Social History   Tobacco Use  Smoking Status Never  Smokeless Tobacco Never   Tobacco Cessation:  N/A, patient does not currently use tobacco products   Blood Alcohol level:  Lab Results  Component Value Date   ETH <10 53/64/6803    Metabolic Disorder Labs:  Lab Results  Component Value Date   HGBA1C 6.1 (H)  01/17/2022   MPG 128 01/17/2022   No results found for: "PROLACTIN" Lab Results  Component Value Date   CHOL 229 (H) 01/17/2022   TRIG 65 01/17/2022   HDL 51 01/17/2022   CHOLHDL 4.5 01/17/2022   LDLCALC 162 (H) 01/17/2022    See Psychiatric Specialty Exam and Suicide Risk Assessment completed by Attending Physician prior to discharge.  Discharge destination:  Home  Is patient on multiple antipsychotic therapies at discharge:  No   Has Patient had three or more failed trials of antipsychotic monotherapy by history:  No  Recommended Plan for Multiple Antipsychotic Therapies: NA  Discharge Instructions     Diet - low sodium heart healthy   Complete by: As directed    Increase activity slowly   Complete by: As directed       Allergies as of 10/03/2022       Reactions   Mucinex [guaifenesin Er] Nausea And Vomiting   Allergic to Liquid form only per patient on 12/15/2019 call   Aspirin Rash   Remote past, has taken recently and had no hives (10/2018)        Medication List     STOP taking these medications    azithromycin 250 MG tablet Commonly known as: Zithromax Z-Pak   ciprofloxacin 500 MG tablet Commonly known as: CIPRO   naproxen sodium  220 MG tablet Commonly known as: ALEVE   Wegovy 1.7 MG/0.75ML Soaj Generic drug: Semaglutide-Weight Management       TAKE these medications      Indication  buPROPion 300 MG 24 hr tablet Commonly known as: WELLBUTRIN XL Take 1 tablet (300 mg total) by mouth every morning. What changed: Another medication with the same name was removed. Continue taking this medication, and follow the directions you see here.  Indication: Major Depressive Disorder   hydrOXYzine 25 MG tablet Commonly known as: ATARAX Take 1-2 tablets (25-50 mg total) by mouth at bedtime as needed.  Indication: Feeling Anxious   PARoxetine 20 MG tablet Commonly known as: PAXIL Take 1 tablet (20 mg total) by mouth at bedtime.  Indication: Major Depressive Disorder   tamsulosin 0.4 MG Caps capsule Commonly known as: FLOMAX Take 1 capsule (0.4 mg total) by mouth daily after supper. What changed: when to take this  Indication: Benign Enlargement of Prostate   thiothixene 10 MG capsule Commonly known as: NAVANE Take 1 capsule (10 mg total) by mouth 2 (two) times daily.  Indication: Schizophrenia   traZODone 100 MG tablet Commonly known as: DESYREL Take 2 tablets (200 mg total) by mouth at bedtime.  Indication: [place] Follow up.   Why: Your hospital follow up appointment is scheduled for Monday, 10/06/22 at 11:00AM. Thanks! Contact information: [place] [address] [phone]                 Follow-up recommendations:  Other:  Follow-up with [facility]  Comments: Prescriptions provide  Signed: Alethia Berthold, MD 10/03/2022, 10:31 AM

## 2022-10-03 NOTE — [facility] Suicide Risk Assessment (Signed)
[facility] Discharge Suicide Risk Assessment   Principal Problem: Paranoid schizophrenia ([facility]) Discharge Diagnoses: Principal Problem:   Paranoid schizophrenia ([facility]) Active Problems:   Psychosis ([facility])   Cocaine use   Total Time spent with patient: 30 minutes  Musculoskeletal: Strength & Muscle Tone: within normal limits Gait & Station: normal Patient leans: N/A  Psychiatric Specialty Exam  Presentation  General Appearance:  Casual  Eye Contact: Good  Speech: Clear and Coherent; Pressured  Speech Volume: Increased  Handedness: Right   Mood and Affect  Mood: -- (elevated)  Duration of Depression Symptoms: Greater than two weeks  Affect: Congruent   Thought Process  Thought Processes: Coherent  Descriptions of Associations:Intact  Orientation:Full (Time, Place and Person)  Thought Content:Paranoid Ideation  History of Schizophrenia/Schizoaffective disorder:Yes  Duration of Psychotic Symptoms:Greater than six months  Hallucinations:No data recorded Ideas of Reference:Delusions; Paranoia  Suicidal Thoughts:No data recorded Homicidal Thoughts:No data recorded  Sensorium  Memory: Immediate Fair  Judgment: Poor  Insight: Fair   Chartered certified accountant: Fair  Attention Span: Fair  Recall: [employer] of Knowledge: Fair  Language: Fair   Psychomotor Activity  Psychomotor Activity:No data recorded  Assets  Assets: Manufacturing systems engineer; Desire for Improvement; Housing; Resilience   Sleep  Sleep:No data recorded  Physical Exam: Physical Exam Vitals reviewed.  Constitutional:      Appearance: Normal appearance.  HENT:     Head: Normocephalic and atraumatic.     Mouth/Throat:     Pharynx: Oropharynx is clear.  Eyes:     Pupils: Pupils are equal, round, and reactive to light.  Cardiovascular:     Rate and Rhythm: Normal rate and regular rhythm.  Pulmonary:     Effort: Pulmonary effort is normal.     Breath  sounds: Normal breath sounds.  Abdominal:     General: Abdomen is flat.     Palpations: Abdomen is soft.  Musculoskeletal:        General: Normal range of motion.  Skin:    General: Skin is warm and dry.  Neurological:     General: No focal deficit present.     Mental Status: He is alert. Mental status is at baseline.  Psychiatric:        Attention and Perception: Attention normal.        Mood and Affect: Mood normal.        Speech: Speech normal.        Behavior: Behavior normal.        Thought Content: Thought content normal.        Cognition and Memory: Cognition normal.    Review of Systems  Constitutional: Negative.   HENT: Negative.    Eyes: Negative.   Respiratory: Negative.    Cardiovascular: Negative.   Gastrointestinal: Negative.   Musculoskeletal: Negative.   Skin: Negative.   Neurological: Negative.   Psychiatric/Behavioral: Negative.     Blood pressure 113/88, pulse (!) 107, temperature 98.8 F (37.1 C), temperature source Oral, resp. rate 20, height 5\' 10"  (1.778 m), weight 74.8 kg, SpO2 99 %. Body mass index is 23.68 kg/m.  Mental Status Per Nursing Assessment::   On Admission:  NA  Demographic Factors:  Male  Loss Factors: NA  Historical Factors: Impulsivity  Risk Reduction Factors:   Living with another person, especially a relative, Positive social support, and Positive therapeutic relationship  Continued Clinical Symptoms:  Alcohol/Substance Abuse/Dependencies Schizophrenia:   Paranoid or undifferentiated type  Cognitive Features That Contribute To Risk:  None  Suicide Risk:  Minimal: No identifiable suicidal ideation.  Patients presenting with no risk factors but with morbid ruminations; may be classified as minimal risk based on the severity of the depressive symptoms   Follow-up Information     [place] Follow up.   Why: Your hospital follow up appointment is scheduled for Monday, 10/06/22 at 11:00AM.  Thanks! Contact information: [place] [address] [phone]                 Plan Of Care/Follow-up recommendations:  Patient is upbeat lucid and not displaying any sign of psychosis.  No report of any suicidal or violent thought.  Agreeable to outpatient treatment.  Will be following up again with his usual providers at [facility].  Alethia Berthold, MD 10/03/2022, 10:25 AM

## 2022-10-03 NOTE — Plan of Care (Signed)
  Problem: Education: Goal: Knowledge of General Education information will improve Description: Including pain rating scale, medication(s)/side effects and non-pharmacologic comfort measures Outcome: Adequate for Discharge   Problem: Health Behavior/Discharge Planning: Goal: Ability to manage health-related needs will improve Outcome: Adequate for Discharge   Problem: Clinical Measurements: Goal: Ability to maintain clinical measurements within normal limits will improve Outcome: Adequate for Discharge Goal: Will remain free from infection Outcome: Adequate for Discharge Goal: Diagnostic test results will improve Outcome: Adequate for Discharge Goal: Respiratory complications will improve Outcome: Adequate for Discharge Goal: Cardiovascular complication will be avoided Outcome: Adequate for Discharge   Problem: Activity: Goal: Risk for activity intolerance will decrease Outcome: Adequate for Discharge   Problem: Nutrition: Goal: Adequate nutrition will be maintained Outcome: Adequate for Discharge   Problem: Coping: Goal: Level of anxiety will decrease Outcome: Adequate for Discharge   Problem: Elimination: Goal: Will not experience complications related to bowel motility Outcome: Adequate for Discharge Goal: Will not experience complications related to urinary retention Outcome: Adequate for Discharge   Problem: Pain Managment: Goal: General experience of comfort will improve Outcome: Adequate for Discharge   Problem: Safety: Goal: Ability to remain free from injury will improve Outcome: Adequate for Discharge   Problem: Skin Integrity: Goal: Risk for impaired skin integrity will decrease Outcome: Adequate for Discharge   Problem: Education: Goal: Knowledge of [place] [facility] Education information/materials will improve Outcome: Adequate for Discharge Goal: Emotional status will improve Outcome: Adequate for Discharge Goal: Mental status will  improve Outcome: Adequate for Discharge Goal: Verbalization of understanding the information provided will improve Outcome: Adequate for Discharge   Problem: Activity: Goal: Interest or engagement in activities will improve Outcome: Adequate for Discharge Goal: Sleeping patterns will improve Outcome: Adequate for Discharge   Problem: Coping: Goal: Ability to verbalize frustrations and anger appropriately will improve Outcome: Adequate for Discharge Goal: Ability to demonstrate self-control will improve Outcome: Adequate for Discharge   Problem: Health Behavior/Discharge Planning: Goal: Identification of resources available to assist in meeting health care needs will improve Outcome: Adequate for Discharge Goal: Compliance with treatment plan for underlying cause of condition will improve Outcome: Adequate for Discharge   Problem: Physical Regulation: Goal: Ability to maintain clinical measurements within normal limits will improve Outcome: Adequate for Discharge   Problem: Safety: Goal: Periods of time without injury will increase Outcome: Adequate for Discharge   

## 2022-10-03 NOTE — Progress Notes (Signed)
Patient ID: Adam Cannon, male   DOB: 1966/05/08, 56 y.o.   MRN: [id number] Patient denies SI/HI/AVH. Belongings were returned to patient. Discharge instructions  including medication and follow up information were reviewed with patient and understanding was verbalized. Patient was not observed to be in distress at time of discharge. Patient was escorted out with staff to medical mall to be transported home by family.

## 2022-10-03 NOTE — BH IP Treatment Plan (Signed)
Interdisciplinary Treatment and Diagnostic Plan Update  10/03/2022 Time of Session: 09:45 ALLAH REASON MRN: [id number]  Principal Diagnosis: Paranoid schizophrenia [facility])  Secondary Diagnoses: Principal Problem:   Paranoid schizophrenia (HCC) Active Problems:   Psychosis (HCC)   Cocaine use   Current Medications:  Current Facility-Administered Medications  Medication Dose Route Frequency Provider Last Rate Last Admin   acetaminophen (TYLENOL) tablet 650 mg  650 mg Oral Q6H PRN Vanetta Mulders, NP       alum & mag hydroxide-simeth (MAALOX/MYLANTA) 200-200-20 MG/5ML suspension 30 mL  30 mL Oral Q4H PRN Gabriel Cirri F, NP       buPROPion (WELLBUTRIN XL) 24 hr tablet 300 mg  300 mg Oral q morning Gabriel Cirri F, NP   300 mg at 10/03/22 0950   hydrOXYzine (ATARAX) tablet 25-50 mg  25-50 mg Oral QHS PRN Vanetta Mulders, NP       magnesium hydroxide (MILK OF MAGNESIA) suspension 30 mL  30 mL Oral Daily PRN Gabriel Cirri F, NP       PARoxetine (PAXIL) tablet 20 mg  20 mg Oral QHS Gabriel Cirri F, NP   20 mg at 10/02/22 2114   tamsulosin (FLOMAX) capsule 0.4 mg  0.4 mg Oral QPC supper Clapacs, [name], MD   0.4 mg at 10/02/22 1831   thiothixene (NAVANE) capsule 10 mg  10 mg Oral BID Gabriel Cirri F, NP   10 mg at 10/03/22 9678   traZODone (DESYREL) tablet 200 mg  200 mg Oral QHS Gabriel Cirri F, NP   200 mg at 10/02/22 2115   PTA Medications: Medications Prior to Admission  Medication Sig Dispense Refill Last Dose   azithromycin (ZITHROMAX Z-PAK) 250 MG tablet Take 2 tabs (500mg  total) on Day 1. Take 1 tab (250mg ) daily for next 4 days. (Patient not taking: Reported on 10/01/2022) 6 tablet 0    buPROPion (WELLBUTRIN XL) 150 MG 24 hr tablet Take 150 mg by mouth every morning. (Patient not taking: Reported on 10/01/2022)      buPROPion (WELLBUTRIN XL) 300 MG 24 hr tablet Take 300 mg by mouth every morning.      ciprofloxacin (CIPRO) 500 MG tablet Take 500 mg by mouth 2  (two) times daily.      hydrOXYzine (ATARAX) 25 MG tablet Take 25-50 mg by mouth at bedtime as needed.      naproxen sodium (ALEVE) 220 MG tablet Take 220 mg by mouth.      PARoxetine (PAXIL) 20 MG tablet Take 20 mg by mouth at bedtime.      tamsulosin (FLOMAX) 0.4 MG CAPS capsule Take 0.4 mg by mouth daily.      thiothixene (NAVANE) 10 MG capsule Take 10 mg by mouth 2 (two) times daily.      traZODone (DESYREL) 100 MG tablet Take 200 mg by mouth at bedtime.      WEGOVY 1.7 MG/0.75ML SOAJ Inject 1.7 mg into the skin once a week. (Patient not taking: Reported on 10/01/2022) 3 mL 2     Patient Stressors: Medication change or noncompliance   Substance abuse    Patient Strengths: Ability for insight  Supportive family/friends   Treatment Modalities: Medication Management, Group therapy, Case management,  1 to 1 session with clinician, Psychoeducation, Recreational therapy.   Physician Treatment Plan for Primary Diagnosis: Paranoid schizophrenia (HCC) Long Term Goal(s): Improvement in symptoms so as ready for discharge   Short Term Goals: Compliance with prescribed medications will improve Ability to identify triggers  associated with substance abuse/mental health issues will improve Ability to verbalize feelings will improve Ability to demonstrate self-control will improve  Medication Management: Evaluate patient's response, side effects, and tolerance of medication regimen.  Therapeutic Interventions: 1 to 1 sessions, Unit Group sessions and Medication administration.  Evaluation of Outcomes: Adequate for Discharge  Physician Treatment Plan for Secondary Diagnosis: Principal Problem:   Paranoid schizophrenia (HCC) Active Problems:   Psychosis (HCC)   Cocaine use  Long Term Goal(s): Improvement in symptoms so as ready for discharge   Short Term Goals: Compliance with prescribed medications will improve Ability to identify triggers associated with substance abuse/mental health  issues will improve Ability to verbalize feelings will improve Ability to demonstrate self-control will improve     Medication Management: Evaluate patient's response, side effects, and tolerance of medication regimen.  Therapeutic Interventions: 1 to 1 sessions, Unit Group sessions and Medication administration.  Evaluation of Outcomes: Adequate for Discharge   RN Treatment Plan for Primary Diagnosis: Paranoid schizophrenia (HCC) Long Term Goal(s): Knowledge of disease and therapeutic regimen to maintain health will improve  Short Term Goals: Ability to remain free from injury will improve, Ability to verbalize frustration and anger appropriately will improve, Ability to demonstrate self-control, Ability to participate in decision making will improve, Ability to verbalize feelings will improve, Ability to disclose and discuss suicidal ideas, Ability to identify and develop effective coping behaviors will improve, and Compliance with prescribed medications will improve  Medication Management: RN will administer medications as ordered by provider, will assess and evaluate patient's response and provide education to patient for prescribed medication. RN will report any adverse and/or side effects to prescribing provider.  Therapeutic Interventions: 1 on 1 counseling sessions, Psychoeducation, Medication administration, Evaluate responses to treatment, Monitor vital signs and CBGs as ordered, Perform/monitor CIWA, COWS, AIMS and Fall Risk screenings as ordered, Perform wound care treatments as ordered.  Evaluation of Outcomes: Adequate for Discharge   LCSW Treatment Plan for Primary Diagnosis: Paranoid schizophrenia (HCC) Long Term Goal(s): Safe transition to appropriate next level of care at discharge, Engage patient in therapeutic group addressing interpersonal concerns.  Short Term Goals: Engage patient in aftercare planning with referrals and resources, Increase social support, Increase  ability to appropriately verbalize feelings, Increase emotional regulation, Facilitate acceptance of mental health diagnosis and concerns, Facilitate patient progression through stages of change regarding substance use diagnoses and concerns, Identify triggers associated with mental health/substance abuse issues, and Increase skills for wellness and recovery  Therapeutic Interventions: Assess for all discharge needs, 1 to 1 time with Social worker, Explore available resources and support systems, Assess for adequacy in community support network, Educate family and significant other(s) on suicide prevention, Complete Psychosocial Assessment, Interpersonal group therapy.  Evaluation of Outcomes: Adequate for Discharge   Progress in Treatment: Attending groups: Yes. Participating in groups: Yes. Taking medication as prescribed: Yes. Toleration medication: Yes. Family/Significant other contact made: No, will contact:  if given permission. Patient understands diagnosis: Yes. Discussing patient identified problems/goals with staff: Yes. Medical problems stabilized or resolved: Yes. Denies suicidal/homicidal ideation: Yes. Issues/concerns per patient self-inventory: No. Other: none.  New problem(s) identified: No, Describe:  none identified.  New Short Term/Long Term Goal(s): detox, elimination of symptoms of psychosis, medication management for mood stabilization; elimination of SI thoughts; development of comprehensive mental wellness/sobriety plan.  Patient Goals:  "basically just focusing on my wellbeing, remaining positive, and focusing on taking my meds."  Discharge Plan or Barriers: Pt plans to discharge home via support system with  follow up through [facility].  Reason for Continuation of Hospitalization: Delusions  Hallucinations Medication stabilization  Estimated Length of Stay: 1-7 days  Last 3 [place] Suicide Severity Risk Score: Flowsheet Row Admission (Current) from 10/01/2022 in  [facility] INPATIENT BEHAVIORAL MEDICINE Most recent reading at 10/01/2022  8:23 PM ED from 10/01/2022 in [facility] Most recent reading at 10/01/2022  9:44 AM  C-SSRS RISK CATEGORY No Risk No Risk       Last PHQ 2/9 Scores:    01/17/2022    8:58 AM 12/20/2021    1:38 PM 12/15/2019    1:17 PM  Depression screen PHQ 2/9  Decreased Interest 0 0 0  Down, Depressed, Hopeless 0 0 0  PHQ - 2 Score 0 0 0  Altered sleeping 0 0   Tired, decreased energy 0 0   Change in appetite 0 0   Feeling bad or failure about yourself  0 0   Trouble concentrating 0 0   Moving slowly or fidgety/restless 0 0   Suicidal thoughts 0 0   PHQ-9 Score 0 0   Difficult doing work/chores Not difficult at all Not difficult at all     Scribe for Treatment Team: Glenis Smoker, LCSW 10/03/2022 10:13 AM

## 2022-11-04 ENCOUNTER — Ambulatory Visit: Payer: Self-pay | Admitting: Family Medicine

## 2022-11-05 ENCOUNTER — Other Ambulatory Visit: Payer: Self-pay

## 2022-11-05 ENCOUNTER — Encounter: Payer: Self-pay | Admitting: Psychiatry

## 2022-11-05 ENCOUNTER — Emergency Department (HOSPITAL_COMMUNITY)
Admission: EM | Admit: 2022-11-05 | Discharge: 2022-11-05 | Disposition: A | Discharge status: Other Acute Inpatient Hospital | Payer: No Typology Code available for payment source | Attending: Emergency Medicine | Admitting: Emergency Medicine

## 2022-11-05 ENCOUNTER — Inpatient Hospital Stay
Admission: RE | Admit: 2022-11-05 | Discharge: 2022-11-07 | DRG: 885 | Disposition: A | Discharge status: Home / Self Care | Payer: No Typology Code available for payment source | Source: Intra-hospital | Attending: Psychiatry | Admitting: Psychiatry

## 2022-11-05 DIAGNOSIS — F1490 Cocaine use, unspecified, uncomplicated: Secondary | ICD-10-CM | POA: Diagnosis present

## 2022-11-05 DIAGNOSIS — Z20822 Contact with and (suspected) exposure to covid-19: Secondary | ICD-10-CM | POA: Diagnosis present

## 2022-11-05 DIAGNOSIS — R45850 Homicidal ideations: Secondary | ICD-10-CM | POA: Diagnosis present

## 2022-11-05 DIAGNOSIS — Z046 Encounter for general psychiatric examination, requested by authority: Secondary | ICD-10-CM | POA: Diagnosis present

## 2022-11-05 DIAGNOSIS — R45851 Suicidal ideations: Secondary | ICD-10-CM | POA: Insufficient documentation

## 2022-11-05 DIAGNOSIS — F200 Paranoid schizophrenia: Principal | ICD-10-CM | POA: Diagnosis present

## 2022-11-05 DIAGNOSIS — Z91148 Patient's other noncompliance with medication regimen for other reason: Secondary | ICD-10-CM | POA: Diagnosis not present

## 2022-11-05 DIAGNOSIS — Z8249 Family history of ischemic heart disease and other diseases of the circulatory system: Secondary | ICD-10-CM

## 2022-11-05 DIAGNOSIS — N400 Enlarged prostate without lower urinary tract symptoms: Secondary | ICD-10-CM | POA: Diagnosis present

## 2022-11-05 DIAGNOSIS — F22 Delusional disorders: Secondary | ICD-10-CM

## 2022-11-05 LAB — COMPREHENSIVE METABOLIC PANEL
ALT: 32 U/L (ref 0–44)
AST: 27 U/L (ref 15–41)
Albumin: 4 g/dL (ref 3.5–5.0)
Alkaline Phosphatase: 62 U/L (ref 38–126)
Anion gap: 10 (ref 5–15)
BUN: 12 mg/dL (ref 6–20)
CO2: 24 mmol/L (ref 22–32)
Calcium: 9.1 mg/dL (ref 8.9–10.3)
Chloride: 104 mmol/L (ref 98–111)
Creatinine, Ser: 1.14 mg/dL (ref 0.61–1.24)
GFR, Estimated: >60 mL/min (ref >60)
Glucose, Bld: 110 mg/dL — ABNORMAL HIGH (ref 70–99)
Potassium: 3.4 mmol/L — ABNORMAL LOW (ref 3.5–5.1)
Sodium: 138 mmol/L (ref 135–145)
Total Bilirubin: 0.7 mg/dL (ref 0.3–1.2)
Total Protein: 7.4 g/dL (ref 6.5–8.1)

## 2022-11-05 LAB — URINE DRUG SCREEN, QUALITATIVE (ARMC ONLY)
Amphetamines, Ur Screen: NOT DETECTED
Barbiturates, Ur Screen: NOT DETECTED
Benzodiazepine, Ur Scrn: NOT DETECTED
Cannabinoid 50 Ng, Ur ~~LOC~~: NOT DETECTED
Cocaine Metabolite,Ur ~~LOC~~: POSITIVE — AB
MDMA (Ecstasy)Ur Screen: NOT DETECTED
Methadone Scn, Ur: NOT DETECTED
Opiate, Ur Screen: NOT DETECTED
Phencyclidine (PCP) Ur S: NOT DETECTED
Tricyclic, Ur Screen: NOT DETECTED

## 2022-11-05 LAB — CBC
HCT: 43.3 % (ref 39.0–52.0)
Hemoglobin: 14.5 g/dL (ref 13.0–17.0)
MCH: 29.4 pg (ref 26.0–34.0)
MCHC: 33.5 g/dL (ref 30.0–36.0)
MCV: 87.8 fL (ref 80.0–100.0)
Platelets: 350 10*3/uL (ref 150–400)
RBC: 4.93 MIL/uL (ref 4.22–5.81)
RDW: 13.9 % (ref 11.5–15.5)
WBC: 14.2 10*3/uL — ABNORMAL HIGH (ref 4.0–10.5)
nRBC: 0 % (ref 0.0–0.2)

## 2022-11-05 LAB — RESP PANEL BY RT-PCR (RSV, FLU A&B, COVID)  RVPGX2
Influenza A by PCR: NEGATIVE
Influenza B by PCR: NEGATIVE
Resp Syncytial Virus by PCR: NEGATIVE
SARS Coronavirus 2 by RT PCR: NEGATIVE

## 2022-11-05 LAB — SALICYLATE LEVEL: Salicylate Lvl: <7 mg/dL — ABNORMAL LOW (ref 7.0–30.0)

## 2022-11-05 LAB — ETHANOL: Alcohol, Ethyl (B): <10 mg/dL (ref <10)

## 2022-11-05 LAB — ACETAMINOPHEN LEVEL: Acetaminophen (Tylenol), Serum: <10 ug/mL — ABNORMAL LOW (ref 10–30)

## 2022-11-05 MED ORDER — PAROXETINE HCL 20 MG PO TABS
20.0000 mg | ORAL_TABLET | Freq: Every day | ORAL | Status: DC
  Administered 2022-11-05 – 2022-11-06 (×2): 20 mg via ORAL
  Filled 2022-11-05 (×2): qty 1

## 2022-11-05 MED ORDER — TRAZODONE HCL 100 MG PO TABS: 200.0000 mg | ORAL_TABLET | Freq: Every day | ORAL | Status: DC

## 2022-11-05 MED ORDER — BUPROPION HCL ER (XL) 150 MG PO TB24
300.0000 mg | ORAL_TABLET | Freq: Every morning | ORAL | Status: DC
  Administered 2022-11-06 – 2022-11-07 (×2): 300 mg via ORAL
  Filled 2022-11-05 (×2): qty 2

## 2022-11-05 MED ORDER — TRAZODONE HCL 100 MG PO TABS
200.0000 mg | ORAL_TABLET | Freq: Every day | ORAL | Status: DC
  Administered 2022-11-05 – 2022-11-06 (×2): 200 mg via ORAL
  Filled 2022-11-05 (×2): qty 2

## 2022-11-05 MED ORDER — THIOTHIXENE 5 MG PO CAPS
10.0000 mg | ORAL_CAPSULE | Freq: Two times a day (BID) | ORAL | Status: DC
  Administered 2022-11-05: 10 mg via ORAL
  Filled 2022-11-05: qty 2

## 2022-11-05 MED ORDER — MAGNESIUM HYDROXIDE 400 MG/5ML PO SUSP
30.0000 mL | Freq: Every day | ORAL | Status: DC | PRN
  Administered 2022-11-07: 30 mL via ORAL
  Filled 2022-11-05: qty 30

## 2022-11-05 MED ORDER — HYDROXYZINE HCL 25 MG PO TABS: 25.0000 mg | ORAL_TABLET | Freq: Every evening | ORAL | Status: DC | PRN

## 2022-11-05 MED ORDER — ACETAMINOPHEN 325 MG PO TABS: 650.0000 mg | ORAL_TABLET | Freq: Four times a day (QID) | ORAL | Status: DC | PRN

## 2022-11-05 MED ORDER — ALUM & MAG HYDROXIDE-SIMETH 200-200-20 MG/5ML PO SUSP: 30.0000 mL | ORAL | Status: DC | PRN

## 2022-11-05 MED ORDER — TAMSULOSIN HCL 0.4 MG PO CAPS: 0.4000 mg | ORAL_CAPSULE | Freq: Every day | ORAL | Status: DC

## 2022-11-05 MED ORDER — PAROXETINE HCL 20 MG PO TABS: 20.0000 mg | ORAL_TABLET | Freq: Every day | ORAL | Status: DC

## 2022-11-05 MED ORDER — TAMSULOSIN HCL 0.4 MG PO CAPS
0.4000 mg | ORAL_CAPSULE | Freq: Every day | ORAL | Status: DC
  Administered 2022-11-05 – 2022-11-06 (×2): 0.4 mg via ORAL
  Filled 2022-11-05 (×3): qty 1

## 2022-11-05 MED ORDER — TRAZODONE HCL 100 MG PO TABS: 100.0000 mg | ORAL_TABLET | Freq: Every evening | ORAL | Status: DC | PRN

## 2022-11-05 MED ORDER — BUPROPION HCL ER (XL) 150 MG PO TB24
300.0000 mg | ORAL_TABLET | Freq: Every morning | ORAL | Status: DC
  Administered 2022-11-05: 300 mg via ORAL
  Filled 2022-11-05: qty 2

## 2022-11-05 MED ORDER — HYDROXYZINE HCL 50 MG PO TABS: 50.0000 mg | ORAL_TABLET | Freq: Three times a day (TID) | ORAL | Status: DC | PRN

## 2022-11-05 MED ORDER — THIOTHIXENE 5 MG PO CAPS
10.0000 mg | ORAL_CAPSULE | Freq: Two times a day (BID) | ORAL | Status: DC
  Administered 2022-11-05 – 2022-11-07 (×4): 10 mg via ORAL
  Filled 2022-11-05 (×5): qty 2

## 2022-11-05 NOTE — ED Provider Notes (Addendum)
[facility] Provider Note    Event Date/Time   First MD Initiated Contact with Patient 11/05/22 [phone]     (approximate)   History   IVC   HPI  Adam Cannon is a 57 y.o. male past medical history of schizophrenia, cocaine use disorder who presents under IVC.  Patient tells me he is here because his sister was worried about him.  Denying any hallucinations or SI or HI.  Tells me he has not been taking his medications for 2 days and thinks he needs something to help him remember to take his medications.  Last used cocaine yesterday.  Per IVC paperwork patient said that he was "going to chop manual his head off and kill him "and that he stated that someone was after him.  Patient tells me he lives with his brother.  Denies any alcohol or other drug use other than cocaine.  Denies other medical complaints at this time.     Past Medical History:  Diagnosis Date   Environmental and seasonal allergies     Patient Active Problem List   Diagnosis Date Noted   Cocaine use 10/02/2022   Paranoid schizophrenia ([place]) 10/01/2022   Psychosis ([place]) 10/01/2022   Pre-diabetes 01/20/2022   Pure hypercholesterolemia 01/20/2022   Obesity (BMI 30.0-34.9) 01/17/2022   Eustachian tube dysfunction, bilateral 12/15/2019     Physical Exam  Triage Vital Signs: ED Triage Vitals  Enc Vitals Group     BP 11/05/22 0652 (!) 168/95     Pulse Rate 11/05/22 0652 (!) 112     Resp 11/05/22 0651 16     Temp 11/05/22 0651 98.9 F (37.2 C)     Temp Source 11/05/22 0651 Oral     SpO2 11/05/22 0652 99 %     Weight 11/05/22 0651 180 lb (81.6 kg)     Height --      Head Circumference --      Peak Flow --      Pain Score --      Pain Loc --      Pain Edu? --      Excl. in [place]? --     Most recent vital signs: Vitals:   11/05/22 0651 11/05/22 0652  BP:  (!) 168/95  Pulse:  (!) 112  Resp: 16   Temp: 98.9 F (37.2 C)   SpO2:  99%     General: Awake, no distress.  CV:  Good  peripheral perfusion.  Resp:  Normal effort.  Abd:  No distention.  Neuro:             Awake, Alert, Oriented x 3  Other:  Patient is calm and cooperative   ED Results / Procedures / Treatments  Labs (all labs ordered are listed, but only abnormal results are displayed) Labs Reviewed  COMPREHENSIVE METABOLIC PANEL - Abnormal; Notable for the following components:      Result Value   Potassium 3.4 (*)    Glucose, Bld 110 (*)    All other components within normal limits  SALICYLATE LEVEL - Abnormal; Notable for the following components:   Salicylate Lvl <1.8 (*)    All other components within normal limits  ACETAMINOPHEN LEVEL - Abnormal; Notable for the following components:   Acetaminophen (Tylenol), Serum <10 (*)    All other components within normal limits  CBC - Abnormal; Notable for the following components:   WBC 14.2 (*)    All other components within normal limits  ETHANOL  URINE DRUG SCREEN, QUALITATIVE ([facility] ONLY)     EKG     RADIOLOGY    PROCEDURES:  Critical Care performed: No  Procedures   MEDICATIONS ORDERED IN ED: Medications - No data to display   IMPRESSION / MDM / [place] / ED COURSE  I reviewed the triage vital signs and the nursing notes.                              Patient's presentation is most consistent with exacerbation of chronic illness.  Differential diagnosis includes, but is not limited to, medication noncompliance, acute psychosis secondary to schizophrenia, cocaine induced psychosis  Patient is a 57 year old male with paranoid schizophrenia and cocaine use disorder who presents under IVC.  IVC paperwork states that patient said that someone was after him and he threatened to chop someone's head off.  Patient currently is calm and cooperative.  Tells me he has not been taking his medication for the last 2 days and is actively using cocaine with last use yesterday.  He is denying SI HI denying hallucinations  currently.  He is resting in bed, calm and cooperative does not appear to be responding to internal stimuli.  Differential is as above.  Will certainly need to obtain collateral information from patient's sister who filled out IVC paperwork.  Will uphold IVC at this time.  Will consult psychiatry.  Reviewed patient's labs which show nonspecific leukocytosis of 14, mild hypokalemia to potassium 3.4.  The patient has been placed in psychiatric observation due to the need to provide a safe environment for the patient while obtaining psychiatric consultation and evaluation, as well as ongoing medical and medication management to treat the patient's condition.  The patient has been placed under full IVC at this time.   Patient has been seen by psychiatry and plan is to admit for inpatient.  I have ordered patient's home medications. Clinical Course as of 11/05/22 0905  Wed Nov 05, 2022  0905 Cocaine Metabolite,[place](!): POSITIVE [KM]    Clinical Course User Index [KM] Rada Hay, MD     FINAL CLINICAL IMPRESSION(S) / ED DIAGNOSES   Final diagnoses:  Paranoia ([place])     Rx / DC Orders   ED Discharge Orders     None        Note:  This document was prepared using Dragon voice recognition software and may include unintentional dictation errors.   Rada Hay, MD 11/05/22 3810    Rada Hay, MD 11/05/22 1005

## 2022-11-05 NOTE — [facility] Suicide Risk Assessment (Signed)
[facility] Admission Suicide Risk Assessment   Nursing information obtained from:    Demographic factors:    Current Mental Status:    Loss Factors:    Historical Factors:    Risk Reduction Factors:     Total Time spent with patient: 45 minutes Principal Problem: Paranoid schizophrenia ([place]) Diagnosis:  Principal Problem:   Paranoid schizophrenia ([place]) Active Problems:   Cocaine use  Subjective Data: Patient seen and chart reviewed.  Patient known from previous encounters.  57 year old man with a history of a diagnosis of schizophrenia and substance abuse presents to the hospital under IVC filed by family.  Reports the patient has been paranoid and psychotic and making homicidal threats.  Patient states that he had been off of his medicine for 2 days.  He also admits that he has been using some cocaine during that time.  He does not deny making threatening statements but denies any intention to hurt anyone and denies any suicidal ideation.  He states that after being off his medicine he feels that he is "not himself".  He is not thinking clearly and feels paranoid.  He is currently denying any hallucinations.  Continued Clinical Symptoms:    The "Alcohol Use Disorders Identification Test", Guidelines for Use in Primary Care, Second Edition.  World Pharmacologist [facility]). Score between 0-7:  no or low risk or alcohol related problems. Score between 8-15:  moderate risk of alcohol related problems. Score between 16-19:  high risk of alcohol related problems. Score 20 or above:  warrants further diagnostic evaluation for alcohol dependence and treatment.   CLINICAL FACTORS:   Alcohol/Substance Abuse/Dependencies Schizophrenia:   Paranoid or undifferentiated type   Musculoskeletal: Strength & Muscle Tone: within normal limits Gait & Station: normal Patient leans: N/A  Psychiatric Specialty Exam:  Presentation  General Appearance:  Casual  Eye Contact: Fair  Speech: Clear and  Coherent  Speech Volume: Normal  Handedness: Right   Mood and Affect  Mood: Labile  Affect: Congruent   Thought Process  Thought Processes: Goal Directed  Descriptions of Associations:Intact  Orientation:Full (Time, Place and Person)  Thought Content:Paranoid Ideation; Delusions  History of Schizophrenia/Schizoaffective disorder:Yes  Duration of Psychotic Symptoms:Less than six months  Hallucinations:Hallucinations: None  Ideas of Reference:Delusions; Paranoia  Suicidal Thoughts:Suicidal Thoughts: No  Homicidal Thoughts:Homicidal Thoughts: No   Sensorium  Memory: Immediate Fair  Judgment: Poor  Insight: Fair   Materials engineer: Fair  Attention Span: Fair  Recall: [place] of Knowledge: Fair  Language: Good   Psychomotor Activity  Psychomotor Activity: Psychomotor Activity: Normal   Assets  Assets: Communication Skills; Desire for Improvement; Housing; Resilience   Sleep  Sleep: Sleep: Fair    Physical Exam: Physical Exam Vitals and nursing note reviewed.  Constitutional:      Appearance: Normal appearance.  HENT:     Head: Normocephalic and atraumatic.     Mouth/Throat:     Pharynx: Oropharynx is clear.  Eyes:     Pupils: Pupils are equal, round, and reactive to light.  Cardiovascular:     Rate and Rhythm: Normal rate and regular rhythm.  Pulmonary:     Effort: Pulmonary effort is normal.     Breath sounds: Normal breath sounds.  Abdominal:     General: Abdomen is flat.     Palpations: Abdomen is soft.  Musculoskeletal:        General: Normal range of motion.  Skin:    General: Skin is warm and dry.  Neurological:  General: No focal deficit present.     Mental Status: He is alert. Mental status is at baseline.  Psychiatric:        Attention and Perception: Attention normal.        Mood and Affect: Mood normal. Affect is blunt.        Speech: Speech is delayed.        Behavior:  Behavior is slowed.        Thought Content: Thought content is paranoid. Thought content does not include suicidal ideation.        Cognition and Memory: Memory is impaired.    Review of Systems  Constitutional: Negative.   HENT: Negative.    Eyes: Negative.   Respiratory: Negative.    Cardiovascular: Negative.   Gastrointestinal: Negative.   Musculoskeletal: Negative.   Skin: Negative.   Neurological: Negative.   Psychiatric/Behavioral:  Positive for depression. The patient is nervous/anxious and has insomnia.    There were no vitals taken for this visit. There is no height or weight on file to calculate BMI.   COGNITIVE FEATURES THAT CONTRIBUTE TO RISK:  None    SUICIDE RISK:   Minimal: No identifiable suicidal ideation.  Patients presenting with no risk factors but with morbid ruminations; may be classified as minimal risk based on the severity of the depressive symptoms  PLAN OF CARE: Continue 15-minute checks.  Restart medications as prescribed previously.  Engage in individual and group therapy.  Ongoing assessment of dangerousness prior to discharge.  I certify that inpatient services furnished can reasonably be expected to improve the patient's condition.   Alethia Berthold, MD 11/05/2022, 1:35 PM

## 2022-11-05 NOTE — H&P (Signed)
Psychiatric Admission Assessment Adult  Patient Identification: Adam Cannon MRN:  [id number] Date of Evaluation:  11/05/2022 Chief Complaint:  Schizophrenia, paranoid ([place]) [F20.0] Principal Diagnosis: Paranoid schizophrenia ([place]) Diagnosis:  Principal Problem:   Paranoid schizophrenia ([place]) Active Problems:   Cocaine use   Schizophrenia, paranoid ([place])  History of Present Illness: Patient seen and chart reviewed.  57 year old man with a history of schizophrenia and substance abuse was brought to the hospital under IVC filed by family.  IVC reports that the patient had been making statements about chopping somebody's head off.  Patient has been cooperative in the ER and in interview with me he tells me that he had been off his medicine for about 2 days and that during that time he started feeling "not myself".  He felt paranoid.  Cannot describe it much more clearly than that.  Also admits that he has been using cocaine for a day or 2.  Patient denies any current homicidal or suicidal thought.  Denies that he is having acute hallucinations.  States that prior to this he had been compliant with his medicine. Associated Signs/Symptoms: Depression Symptoms:  anxiety, (Hypo) Manic Symptoms:  Impulsivity, Anxiety Symptoms:  Excessive Worry, Psychotic Symptoms:  Hallucinations: Auditory Paranoia, PTSD Symptoms: Negative Total Time spent with patient: 1 hour  Past Psychiatric History: Patient has a past history of mental health concerns with diagnoses of schizophrenia and substance abuse.  Has had previous hospitalizations usually for short length of time most recently just a month ago in December.  No previous actual suicide attempts.  Is the patient at risk to self? Yes.    Has the patient been a risk to self in the past 6 months? Yes.    Has the patient been a risk to self within the distant past? Yes.    Is the patient a risk to others? No.  Has the patient been a risk to others in the  past 6 months? Yes.    Has the patient been a risk to others within the distant past? No.   [place] Scale:  Flowsheet Row Admission (Discharged) from 10/01/2022 in [place] Most recent reading at 10/01/2022  8:23 PM ED from 10/01/2022 in [facility] Most recent reading at 10/01/2022  9:44 AM  C-SSRS RISK CATEGORY No Risk No Risk        Prior Inpatient Therapy: Yes.   If yes, describe last treatment was here at our hospital in December Prior Outpatient Therapy: Yes.   If yes, describe goes to [facility] and sees Dr. Jack Quarto  Alcohol Screening:   Substance Abuse History in the last 12 months:  Yes.   Consequences of Substance Abuse: Cocaine abuse appears to be directly related to periods of worsening symptoms Previous Psychotropic Medications: Yes  Psychological Evaluations: Yes  Past Medical History:  Past Medical History:  Diagnosis Date   Environmental and seasonal allergies     Past Surgical History:  Procedure Laterality Date   NO PAST SURGERIES     Family History:  Family History  Problem Relation Age of Onset   Hypertension Mother    Breast cancer Mother    Hypertension Father    Healthy Sister    Healthy Brother    Hypertension Maternal Uncle    Heart attack Maternal Uncle    Prostate cancer Neg Hx    Family Psychiatric  History: See previous Tobacco Screening:  Social History   Tobacco Use  Smoking Status Never  Smokeless Tobacco Never    BH Tobacco Counseling     Are you interested in Tobacco Cessation Medications?  N/A, patient does not use tobacco products Counseled patient on smoking cessation:  N/A, patient does not use tobacco products Reason Tobacco Screening Not Completed: No value filed.       Social History:  Social History   Substance and Sexual Activity  Alcohol Use No     Social History   Substance and Sexual Activity  Drug Use Never    Additional Social History:                            Allergies:   Allergies  Allergen Reactions   Mucinex [Guaifenesin Er] Nausea And Vomiting    Allergic to Liquid form only per patient on 12/15/2019 call   Aspirin Rash    Remote past, has taken recently and had no hives (10/2018)   Lab Results:  Results for orders placed or performed during the hospital encounter of 11/05/22 (from the past 48 hour(s))  Comprehensive metabolic panel     Status: Abnormal   Collection Time: 11/05/22  6:54 AM  Result Value Ref Range   Sodium 138 135 - 145 mmol/L   Potassium 3.4 (L) 3.5 - 5.1 mmol/L   Chloride 104 98 - 111 mmol/L   CO2 24 22 - 32 mmol/L   Glucose, Bld 110 (H) 70 - 99 mg/dL    Comment: Glucose reference range applies only to samples taken after fasting for at least 8 hours.   BUN 12 6 - 20 mg/dL   Creatinine, Ser 4.85 0.61 - 1.24 mg/dL   Calcium 9.1 8.9 - 46.2 mg/dL   Total Protein 7.4 6.5 - 8.1 g/dL   Albumin 4.0 3.5 - 5.0 g/dL   AST 27 15 - 41 U/L   ALT 32 0 - 44 U/L   Alkaline Phosphatase 62 38 - 126 U/L   Total Bilirubin 0.7 0.3 - 1.2 mg/dL   GFR, Estimated >70 >35 mL/min    Comment: (NOTE) Calculated using the CKD-EPI Creatinine Equation (2021)    Anion gap 10 5 - 15    Comment: Performed at [facility], [address], [place], [place] [address]  Ethanol     Status: None   Collection Time: 11/05/22  6:54 AM  Result Value Ref Range   Alcohol, Ethyl (B) <10 <10 mg/dL    Comment: (NOTE) Lowest detectable limit for serum alcohol is 10 mg/dL.  For medical purposes only. Performed at [facility], [address] [place], [place] [address]   Salicylate level     Status: Abnormal   Collection Time: 11/05/22  6:54 AM  Result Value Ref Range   Salicylate Lvl <7.0 (L) 7.0 - 30.0 mg/dL    Comment: Performed at [facility], [address] [place], [place] [address]  Acetaminophen level     Status: Abnormal   Collection Time: 11/05/22  6:54 AM  Result Value Ref  Range   Acetaminophen (Tylenol), Serum <10 (L) 10 - 30 ug/mL    Comment: (NOTE) Therapeutic concentrations vary significantly. A range of 10-30 ug/mL  may be an effective concentration for many patients. However, some  are best treated at concentrations outside of this range. Acetaminophen concentrations >150 ug/mL at 4 hours after ingestion  and >50 ug/mL at 12 hours after ingestion are often associated with  toxic reactions.  Performed at [facility],  [place], [place] [address]   cbc     Status: Abnormal   Collection Time: 11/05/22  6:54 AM  Result Value Ref Range   WBC 14.2 (H) 4.0 - 10.5 K/uL   RBC 4.93 4.22 - 5.81 MIL/uL   Hemoglobin 14.5 13.0 - 17.0 g/dL   HCT 43.3 39.0 - 52.0 %   MCV 87.8 80.0 - 100.0 fL   MCH 29.4 26.0 - 34.0 pg   MCHC 33.5 30.0 - 36.0 g/dL   RDW 13.9 11.5 - 15.5 %   Platelets 350 150 - 400 K/uL   nRBC 0.0 0.0 - 0.2 %    Comment: Performed at [facility], [address] [place], [place] [address]  Urine Drug Screen, Qualitative     Status: Abnormal   Collection Time: 11/05/22  6:58 AM  Result Value Ref Range   Tricyclic, Ur Screen NONE DETECTED NONE DETECTED   Amphetamines, Ur Screen NONE DETECTED NONE DETECTED   MDMA (Ecstasy)Ur Screen NONE DETECTED NONE DETECTED   Cocaine Metabolite,Ur [place] POSITIVE (A) NONE DETECTED   Opiate, Ur Screen NONE DETECTED NONE DETECTED   Phencyclidine (PCP) Ur S NONE DETECTED NONE DETECTED   Cannabinoid 50 Ng, Ur [place] NONE DETECTED NONE DETECTED   Barbiturates, Ur Screen NONE DETECTED NONE DETECTED   Benzodiazepine, Ur Scrn NONE DETECTED NONE DETECTED   Methadone Scn, Ur NONE DETECTED NONE DETECTED    Comment: (NOTE) Tricyclics + metabolites, urine    Cutoff 1000 ng/mL Amphetamines + metabolites, urine  Cutoff 1000 ng/mL MDMA (Ecstasy), urine              Cutoff 500 ng/mL Cocaine Metabolite, urine          Cutoff 300 ng/mL Opiate + metabolites, urine        Cutoff 300  ng/mL Phencyclidine (PCP), urine         Cutoff 25 ng/mL Cannabinoid, urine                 Cutoff 50 ng/mL Barbiturates + metabolites, urine  Cutoff 200 ng/mL Benzodiazepine, urine              Cutoff 200 ng/mL Methadone, urine                   Cutoff 300 ng/mL  The urine drug screen provides only a preliminary, unconfirmed analytical test result and should not be used for non-medical purposes. Clinical consideration and professional judgment should be applied to any positive drug screen result due to possible interfering substances. A more specific alternate chemical method must be used in order to obtain a confirmed analytical result. Gas chromatography / mass spectrometry (GC/MS) is the preferred confirm atory method. Performed at [facility], [address] [place],  [address]   Resp panel by RT-PCR (RSV, Flu A&B, Covid) Anterior Nasal Swab     Status: None   Collection Time: 11/05/22 11:37 AM   Specimen: Anterior Nasal Swab  Result Value Ref Range   SARS Coronavirus 2 by RT PCR NEGATIVE NEGATIVE    Comment: (NOTE) SARS-CoV-2 target nucleic acids are NOT DETECTED.  The SARS-CoV-2 RNA is generally detectable in upper respiratory specimens during the acute phase of infection. The lowest concentration of SARS-CoV-2 viral copies this assay can detect is 138 copies/mL. A negative result does not preclude SARS-Cov-2 infection and should not be used as the sole basis for treatment or other patient management decisions. A negative result may occur with  improper specimen collection/handling, submission of specimen other than nasopharyngeal swab, presence of viral mutation(s) within the areas targeted by this assay, and inadequate number of viral copies(<138 copies/mL). A negative result must be combined with clinical observations, patient history, and epidemiological information. The expected result is Negative.  Fact Sheet for Patients:   EntrepreneurPulse.com.au  Fact Sheet for Healthcare Providers:  IncredibleEmployment.be  This test is no t yet approved or cleared by the [place] FDA and  has been authorized for detection and/or diagnosis of SARS-CoV-2 by FDA under an Emergency Use Authorization (EUA). This EUA will remain  in effect (meaning this test can be used) for the duration of the COVID-19 declaration under Section 564(b)(1) of the Act, 21 U.S.C.section 360bbb-3(b)(1), unless the authorization is terminated  or revoked sooner.       Influenza A by PCR NEGATIVE NEGATIVE   Influenza B by PCR NEGATIVE NEGATIVE    Comment: (NOTE) The Xpert Xpress SARS-CoV-2/FLU/RSV plus assay is intended as an aid in the diagnosis of influenza from Nasopharyngeal swab specimens and should not be used as a sole basis for treatment. Nasal washings and aspirates are unacceptable for Xpert Xpress SARS-CoV-2/FLU/RSV testing.  Fact Sheet for Patients: EntrepreneurPulse.com.au  Fact Sheet for Healthcare Providers: IncredibleEmployment.be  This test is not yet approved or cleared by the [place] FDA and has been authorized for detection and/or diagnosis of SARS-CoV-2 by FDA under an Emergency Use Authorization (EUA). This EUA will remain in effect (meaning this test can be used) for the duration of the COVID-19 declaration under Section 564(b)(1) of the Act, 21 U.S.C. section 360bbb-3(b)(1), unless the authorization is terminated or revoked.     Resp Syncytial Virus by PCR NEGATIVE NEGATIVE    Comment: (NOTE) Fact Sheet for Patients: EntrepreneurPulse.com.au  Fact Sheet for Healthcare Providers: IncredibleEmployment.be  This test is not yet approved or cleared by the [place] FDA and has been authorized for detection and/or diagnosis of SARS-CoV-2 by FDA under an Emergency Use Authorization (EUA).  This EUA will remain in effect (meaning this test can be used) for the duration of the COVID-19 declaration under Section 564(b)(1) of the Act, 21 U.S.C. section 360bbb-3(b)(1), unless the authorization is terminated or revoked.  Performed at [facility], [address] [place], [place] [address]     Blood Alcohol level:  Lab Results  Component Value Date   [facility] <10 11/05/2022   ETH <10 26/83/4196    Metabolic Disorder Labs:  Lab Results  Component Value Date   HGBA1C 6.1 (H) 01/17/2022   MPG 128 01/17/2022   No results found for: "PROLACTIN" Lab Results  Component Value Date   CHOL 229 (H) 01/17/2022   TRIG 65 01/17/2022   HDL 51 01/17/2022   CHOLHDL 4.5 01/17/2022   LDLCALC 162 (H) 01/17/2022    Current Medications: Current Facility-Administered Medications  Medication Dose Route Frequency Provider Last Rate Last Admin   acetaminophen (TYLENOL) tablet 650 mg  650 mg Oral Q6H PRN Anirudh Baiz, [name], MD       alum & mag hydroxide-simeth (MAALOX/MYLANTA) 200-200-20 MG/5ML suspension 30 mL  30 mL Oral Q4H PRN Earmon Sherrow, [name], MD       Derrill Memo ON 11/06/2022] buPROPion (WELLBUTRIN XL) 24 hr tablet 300 mg  300 mg Oral q morning Carry Weesner T, MD       hydrOXYzine (ATARAX) tablet 25-50 mg  25-50 mg Oral QHS PRN Jermain Curt, [name], MD       hydrOXYzine (ATARAX) tablet 50  mg  50 mg Oral TID PRN Caryn Gienger, [name], MD       magnesium hydroxide (MILK OF MAGNESIA) suspension 30 mL  30 mL Oral Daily PRN Acheron Sugg, [name], MD       PARoxetine (PAXIL) tablet 20 mg  20 mg Oral QHS Caterine Mcmeans T, MD       tamsulosin (FLOMAX) capsule 0.4 mg  0.4 mg Oral QPC supper Britton Bera, [name], MD       thiothixene (NAVANE) capsule 10 mg  10 mg Oral BID Eavan Gonterman, [name], MD       traZODone (DESYREL) tablet 100 mg  100 mg Oral QHS PRN Iziah Cates, [name], MD       traZODone (DESYREL) tablet 200 mg  200 mg Oral QHS Lavonda Thal, [name], MD       PTA Medications: Medications Prior to Admission  Medication  Sig Dispense Refill Last Dose   buPROPion (WELLBUTRIN XL) 300 MG 24 hr tablet Take 1 tablet (300 mg total) by mouth every morning. 30 tablet 0    hydrOXYzine (ATARAX) 25 MG tablet Take 1-2 tablets (25-50 mg total) by mouth at bedtime as needed. 30 tablet 0    PARoxetine (PAXIL) 20 MG tablet Take 1 tablet (20 mg total) by mouth at bedtime. 30 tablet 0    tamsulosin (FLOMAX) 0.4 MG CAPS capsule Take 1 capsule (0.4 mg total) by mouth daily after supper. 30 capsule 0    thiothixene (NAVANE) 10 MG capsule Take 1 capsule (10 mg total) by mouth 2 (two) times daily. 60 capsule 0    traZODone (DESYREL) 100 MG tablet Take 2 tablets (200 mg total) by mouth at bedtime. 60 tablet 0     Musculoskeletal: Strength & Muscle Tone: within normal limits Gait & Station: normal Patient leans: N/A            Psychiatric Specialty Exam:  Presentation  General Appearance:  Casual  Eye Contact: Fair  Speech: Clear and Coherent  Speech Volume: Normal  Handedness: Right   Mood and Affect  Mood: Labile  Affect: Congruent   Thought Process  Thought Processes: Goal Directed  Duration of Psychotic Symptoms: 2 days Past Diagnosis of Schizophrenia or Psychoactive disorder: Yes  Descriptions of Associations:Intact  Orientation:Full (Time, Place and Person)  Thought Content:Paranoid Ideation; Delusions  Hallucinations:Hallucinations: None  Ideas of Reference:Delusions; Paranoia  Suicidal Thoughts:Suicidal Thoughts: No  Homicidal Thoughts:Homicidal Thoughts: No   Sensorium  Memory: Immediate Fair  Judgment: Poor  Insight: Fair   Materials engineer: Fair  Attention Span: Fair  Recall: [place] of Knowledge: Fair  Language: Good   Psychomotor Activity  Psychomotor Activity: Psychomotor Activity: Normal   Assets  Assets: Communication Skills; Desire for Improvement; Housing; Resilience   Sleep  Sleep: Sleep:  Fair    Physical Exam: Physical Exam Vitals and nursing note reviewed.  Constitutional:      Appearance: Normal appearance.  HENT:     Head: Normocephalic and atraumatic.     Mouth/Throat:     Pharynx: Oropharynx is clear.  Eyes:     Pupils: Pupils are equal, round, and reactive to light.  Cardiovascular:     Rate and Rhythm: Normal rate and regular rhythm.  Pulmonary:     Effort: Pulmonary effort is normal.     Breath sounds: Normal breath sounds.  Abdominal:     General: Abdomen is flat.     Palpations: Abdomen is soft.  Musculoskeletal:  General: Normal range of motion.  Skin:    General: Skin is warm and dry.  Neurological:     General: No focal deficit present.     Mental Status: He is alert. Mental status is at baseline.  Psychiatric:        Attention and Perception: Attention normal.        Mood and Affect: Mood normal. Affect is blunt.        Speech: Speech is delayed.        Behavior: Behavior is slowed.        Thought Content: Thought content is paranoid. Thought content does not include homicidal or suicidal ideation.        Cognition and Memory: Memory is impaired.    Review of Systems  Constitutional: Negative.   HENT: Negative.    Eyes: Negative.   Respiratory: Negative.    Cardiovascular: Negative.   Gastrointestinal: Negative.   Musculoskeletal: Negative.   Skin: Negative.   Neurological: Negative.   Psychiatric/Behavioral:  Positive for substance abuse. The patient is nervous/anxious.    There were no vitals taken for this visit. There is no height or weight on file to calculate BMI.  Treatment Plan Summary: Medication management and Plan restart medication as previously prescribed including bupropion and Navane.  Monitor vitals monitor labs.  Engage in individual and group therapy and assessment.  Plan for likely length of stay only a couple days based on previous episodes with return to [facility] treatment afterwards.  Observation  Level/Precautions:  15 minute checks  Laboratory:  Chemistry Profile  Psychotherapy:    Medications:    Consultations:    Discharge Concerns:    Estimated LOS:  Other:     Physician Treatment Plan for Primary Diagnosis: Paranoid schizophrenia ([place]) Long Term Goal(s): Improvement in symptoms so as ready for discharge  Short Term Goals: Ability to verbalize feelings will improve and Ability to demonstrate self-control will improve  Physician Treatment Plan for Secondary Diagnosis: Principal Problem:   Paranoid schizophrenia ([place]) Active Problems:   Cocaine use   Schizophrenia, paranoid ([place])  Long Term Goal(s): Improvement in symptoms so as ready for discharge  Short Term Goals: Ability to identify and develop effective coping behaviors will improve and Ability to maintain clinical measurements within normal limits will improve  I certify that inpatient services furnished can reasonably be expected to improve the patient's condition.    Alethia Berthold, MD 1/17/20241:37 PM

## 2022-11-05 NOTE — Progress Notes (Signed)
BP (!) 142/91 (BP Location: Left Arm)   Pulse 86   Temp 98 F (36.7 C) (Oral)   Resp 18   Ht 5\' 10"  (1.778 m)   Wt 72.6 kg   SpO2 100%   BMI 22.96 kg/m   Adam Cannon is a 58 year old male who was IVC'd by his sister and transferred from [facility] ED due to being non-compliant with his medications, endorsing HI towards his brother, and experiencing hallucinations/paranoia. Per patient, "I have been having memory issues and couldn't always remember when to take my meds." Patient stated he had missed "a day or 2" of his medications and that is what lead to him "acting out." Patient also admitted to cocaine use yesterday. Patient denied any other substance use or current withdrawal symptoms. Patient admits having an argument with his brother and sister over thinking his brother had sold his house and forged his signature but currently denies any present HI. Patient states his sister was worried about him but that he does not want to hurt himself or others and just needs help remembering to take his medications. Patient denies SI and A/V/H with no plan/intent. Patient states "I am just really tired." Patient stated not getting any sleep last night. 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 Patient also states he has been seeing a therapist at [facility] named Lorriane Shire and last visit was about a month ago. Patient plans to return home upon discharge. Patient oriented to unit and unit rules. Patient denies any further questions or concerns at this moment. Patient remains safe on unit with no s/s of current distress.

## 2022-11-05 NOTE — Group Note (Signed)
[facility] LCSW Group Therapy Note   Group Date: 11/05/2022 Start Time: 1300 End Time: 1400   Type of Therapy/Topic:  Group Therapy:  Emotion Regulation  Participation Level:  Did Not Attend   Mood:  Description of Group:    The purpose of this group is to assist patients in learning to regulate negative emotions and experience positive emotions. Patients will be guided to discuss ways in which they have been vulnerable to their negative emotions. These vulnerabilities will be juxtaposed with experiences of positive emotions or situations, and patients challenged to use positive emotions to combat negative ones. Special emphasis will be placed on coping with negative emotions in conflict situations, and patients will process healthy conflict resolution skills.  Therapeutic Goals: Patient will identify two positive emotions or experiences to reflect on in order to balance out negative emotions:  Patient will label two or more emotions that they find the most difficult to experience:  Patient will be able to demonstrate positive conflict resolution skills through discussion or role plays:   Summary of Patient Progress:   X    Therapeutic Modalities:   Cognitive Behavioral Therapy Feelings Identification Dialectical Behavioral Therapy   Rozann Lesches, LCSW
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
## 2022-11-05 NOTE — ED Triage Notes (Signed)
Pt presents with police with IVC papers for mental health evaluation. Pt declines to answer questions in triage, but does deny pain.

## 2022-11-05 NOTE — BH Assessment (Addendum)
Patient is to be admitted to [facility]U today pending negative Covid 11/05/22 by Dr. Weber Cooks.  Attending Physician will be Dr.  Weber Cooks .   Patient has been assigned to room 322, by [place], Nicole Kindred.    ER staff is aware of the admission: Rachel,ER Secretary   Dr. Starleen Blue, ER MD  Deneise Lever, Patient's Nurse

## 2022-11-05 NOTE — ED Notes (Signed)
Pt withdrawn, but following commands at this time. Given blankets and tv remote at this time.

## 2022-11-05 NOTE — Progress Notes (Signed)
Patient calm and cooperative during assessment denying SI/HI/AVH. Pt endorses anxiety. Pt stated he just needs to get some good sleep. Pt compliant with medication administration per MD orders. Pt given education, support, and encouragement to be active in his treatment plan. Pt being monitored Q 15 minutes for safety per unit protocol, remains safe on the unit  
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
## 2022-11-05 NOTE — Tx Team (Addendum)
Initial Treatment Plan 11/05/2022 2:57 PM [name] ZOX:[id number]    PATIENT STRESSORS: Marital or family conflict   Medication change or noncompliance   Substance abuse     PATIENT STRENGTHS: Supportive family/friends  Work skills    PATIENT IDENTIFIED PROBLEMS: Medication non-compliance  Substance abuse                   DISCHARGE CRITERIA:  Improved stabilization in mood, thinking, and/or behavior Verbal commitment to aftercare and medication compliance  PRELIMINARY DISCHARGE PLAN: Return to previous living arrangement  PATIENT/FAMILY INVOLVEMENT: This treatment plan has been presented to and reviewed with the patient, Adam Cannon. The patient and family have been given the opportunity to ask questions and make suggestions.  Melinda Crutch, RN 11/05/2022, 2:57 PM

## 2022-11-05 NOTE — ED Notes (Signed)
Breakfast tray given to pt.

## 2022-11-05 NOTE — ED Notes (Signed)
Pt dressed out into burgundy scrubs with this tech, [place], EDT, and a male [place] in the rm. Pt belongings consist of: grey sweatshirt, black shirt, tan boots, black socks, black sweat pants, and grey underwear. Pt belongings placed into one pt belongings bag and labeled with pt name. Pt calm and cooperative while dressing out.

## 2022-11-06 ENCOUNTER — Other Ambulatory Visit: Payer: Self-pay

## 2022-11-06 LAB — HEMOGLOBIN A1C
Hgb A1c MFr Bld: 5.4 % (ref 4.8–5.6)
Mean Plasma Glucose: 108.28 mg/dL

## 2022-11-06 LAB — LIPID PANEL
Cholesterol: 214 mg/dL — ABNORMAL HIGH (ref 0–200)
HDL: 75 mg/dL (ref >40)
LDL Cholesterol: 126 mg/dL — ABNORMAL HIGH (ref 0–99)
Total CHOL/HDL Ratio: 2.9 RATIO
Triglycerides: 64 mg/dL (ref <150)
VLDL: 13 mg/dL (ref 0–40)

## 2022-11-06 MED ORDER — TAMSULOSIN HCL 0.4 MG PO CAPS: 0.4000 mg | ORAL_CAPSULE | Freq: Every day | ORAL | 1 refills | Status: DC

## 2022-11-06 MED ORDER — HYDROXYZINE HCL 25 MG PO TABS
25.0000 mg | ORAL_TABLET | Freq: Every evening | ORAL | 0 refills | Status: AC | PRN
  Filled 2022-11-06: qty 30, 15d supply, fill #0

## 2022-11-06 MED ORDER — TRAZODONE HCL 100 MG PO TABS: 200.0000 mg | ORAL_TABLET | Freq: Every day | ORAL | 1 refills | Status: DC

## 2022-11-06 MED ORDER — BUPROPION HCL ER (XL) 300 MG PO TB24
300.0000 mg | ORAL_TABLET | Freq: Every morning | ORAL | 0 refills | Status: AC
  Filled 2022-11-06: qty 30, 30d supply, fill #0

## 2022-11-06 MED ORDER — THIOTHIXENE 10 MG PO CAPS
10.0000 mg | ORAL_CAPSULE | Freq: Two times a day (BID) | ORAL | 0 refills | Status: AC
  Filled 2022-11-06: qty 60, 30d supply, fill #0

## 2022-11-06 MED ORDER — BUPROPION HCL ER (XL) 300 MG PO TB24: 300.0000 mg | ORAL_TABLET | Freq: Every morning | ORAL | 1 refills | Status: DC

## 2022-11-06 MED ORDER — TAMSULOSIN HCL 0.4 MG PO CAPS
0.4000 mg | ORAL_CAPSULE | Freq: Every day | ORAL | 0 refills | Status: DC
  Filled 2022-11-06: qty 30, 30d supply, fill #0

## 2022-11-06 MED ORDER — THIOTHIXENE 10 MG PO CAPS: 10.0000 mg | ORAL_CAPSULE | Freq: Two times a day (BID) | ORAL | 1 refills | Status: DC

## 2022-11-06 MED ORDER — PAROXETINE HCL 20 MG PO TABS: 20.0000 mg | ORAL_TABLET | Freq: Every day | ORAL | 1 refills | Status: DC

## 2022-11-06 MED ORDER — PAROXETINE HCL 20 MG PO TABS
20.0000 mg | ORAL_TABLET | Freq: Every day | ORAL | 0 refills | Status: AC
  Filled 2022-11-06: qty 30, 30d supply, fill #0

## 2022-11-06 MED ORDER — HYDROXYZINE HCL 25 MG PO TABS: 25.0000 mg | ORAL_TABLET | Freq: Every evening | ORAL | 1 refills | Status: DC | PRN

## 2022-11-06 MED ORDER — TRAZODONE HCL 100 MG PO TABS
200.0000 mg | ORAL_TABLET | Freq: Every day | ORAL | 0 refills | Status: DC
  Filled 2022-11-06: qty 60, 30d supply, fill #0

## 2022-11-06 NOTE — [facility] Group Notes (Signed)
[place] Group Notes:  (Nursing/MHT/Case Management/Adjunct)  Date:  11/06/2022  Time:  10:01 PM  Type of Therapy:   Wrap up  Participation Level:  Did Not Attend  Summary of Progress/Problems:  Adam Cannon 11/06/2022, 10:01 PM

## 2022-11-06 NOTE — [facility] Suicide Risk Assessment (Signed)
[place] INPATIENT:  Family/Significant Other Suicide Prevention Education  Suicide Prevention Education:  Patient Refusal for Family/Significant Other Suicide Prevention Education: The patient Adam Cannon has refused to provide written consent for family/significant other to be provided Family/Significant Other Suicide Prevention Education during admission and/or prior to discharge.  Physician notified.  SPE completed with pt, as pt refused to consent to family contact. SPI pamphlet provided to pt and pt was encouraged to share information with support network, ask questions, and talk about any concerns relating to SPE. Pt denies access to guns/firearms and verbalized understanding of information provided. Mobile Crisis information also provided to pt.  Shirl Harris 11/06/2022, 9:43 AM

## 2022-11-06 NOTE — Progress Notes (Signed)
Pt denies SI/HI/AVH and verbally agrees to approach staff if these become apparent or before harming themselves/others. Rates depression 5/10. Rates anxiety 5/10. Rates pain 0/10. Pt has been in his room all day but has come out for meals and phone calls. Pt stated that he feels ready to go. Scheduled medications administered to pt, per MD orders. RN provided support and encouragement to pt. Q15 min safety checks implemented and continued. Pt safe on the unit. RN will continue to monitor and intervene as needed.  11/06/22 0834  Psych Admission Type (Psych Patients Only)  Admission Status Involuntary  Psychosocial Assessment  Patient Complaints None  Eye Contact Fair  Facial Expression Pensive  Affect Appropriate to circumstance  Speech Logical/coherent  Interaction Minimal  Motor Activity Other (Comment) (WDL)  Appearance/Hygiene Unremarkable  Behavior Characteristics Cooperative;Appropriate to situation;Calm  Mood Pleasant  Aggressive Behavior  Effect No apparent injury  Thought Process  Coherency Circumstantial  Content Blaming others  Delusions None reported or observed  Perception WDL  Hallucination None reported or observed  Judgment Impaired  Confusion None  Danger to Self  Current suicidal ideation? Denies  Danger to Others  Danger to Others None reported or observed  Danger to Others Abnormal  Harmful Behavior to others No threats or harm toward other people  Destructive Behavior No threats or harm toward property

## 2022-11-06 NOTE — [facility] Counselor (Signed)
Adult Comprehensive Assessment  Patient ID: Adam Cannon, male   DOB: 11-12-1965, 57 y.o.   MRN: [id number]  Information Source: Information source: Patient (Previous PSA from 09/2022 encounter.)  Current Stressors:  Patient states their primary concerns and needs for treatment are:: "Same thing...was forgetting to take my meds." Patient states their goals for this hospitilization and ongoing recovery are:: "That and anxiety, depression." Educational / Learning stressors: None reported Employment / Job issues: None reported Family Relationships: Pt does acknowledge some issues with the relationship with his brother. Financial / Lack of resources (include bankruptcy): None reported Housing / Lack of housing: None reported Physical health (include injuries & life threatening diseases): None reported Social relationships: None reported Substance abuse: History of substance use and a recent slip with last use on Monday, 11/03/22. Bereavement / Loss: None reported  Living/Environment/Situation:  Living Arrangements: Other relatives Living conditions (as described by patient or guardian): Pt lives in a family home. Who else lives in the home?: Pt and his older brother How long has patient lived in current situation?: "Off and on over 40 years." What is atmosphere in current home: Comfortable, Other (Comment) ("It's ok, not the best but ok.")  Family History:  Marital status: Single Are you sexually active?: No What is your sexual orientation?: Heterosexual Has your sexual activity been affected by drugs, alcohol, medication, or emotional stress?: N/A Does patient have children?: No  Childhood History:  By whom was/is the patient raised?: Both parents Additional childhood history information: Pt states that his parents separated. Children remained with mother. Description of patient's relationship with caregiver when they were a child: "Dad did the discipline." "Good, very  good." Patient's description of current relationship with people who raised him/her: Both parents are deceased. How were you disciplined when you got in trouble as a child/adolescent?: Pt states that his dad did the disciplining. He denies any abuse. Does patient have siblings?: Yes Number of Siblings: 2 (Older brother and sister. Pt is the youngest child.) Description of patient's current relationship with siblings: "It's not that great with my brother but my sister and I get along." Did patient suffer any verbal/emotional/physical/sexual abuse as a child?: No Did patient suffer from severe childhood neglect?: No Has patient ever been sexually abused/assaulted/raped as an adolescent or adult?: No Was the patient ever a victim of a crime or a disaster?: No Witnessed domestic violence?: No Has patient been affected by domestic violence as an adult?: No  Education:  Highest grade of school patient has completed: Psychologist, clinical in [employer]. 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 He states that he does not know how much that he uses but he does  use "like  every two days." "I think my last use was Monday." UDS positive for cocaine but not any other substances. If attempted suicide, did drugs/alcohol play a role in this?:  (N/A) Alcohol/Substance Abuse Treatment Hx: Relapse prevention program If yes, describe treatment: Pt completed a year of treatment through [facility]. Has alcohol/substance abuse ever caused legal problems?: No  Social Support System:   Patient's Community Support System: Good Describe Community Support System: "I'm enrolled at [employer]. My counselor there she is part of my support. And I have other people, my sister, and I attend meetings." Type of faith/religion: "I believe in God." How does patient's faith help to cope with current illness?: "Just pray, pray a lot and talk to God."  Leisure/Recreation:   Do You Have Hobbies?: Yes Leisure and Hobbies: "I like to watch sports, I love sports."  Strengths/Needs:   What is the patient's perception of their strengths?: "Evaluating the situation." Patient states they can use these personal strengths during their treatment to contribute to their recovery: He endorses that he feels this can be helpful in his recovery. Patient states these barriers may affect/interfere with their treatment: Pt denies any barriers. Patient states these barriers may affect their return to the community: Pt denies any barriers. Other important information patient would like considered in planning for their treatment: N/A  Discharge Plan:   Currently receiving community mental health services: Yes (From Whom) ([facility], therapist Lorriane Shire and Dr. Jamse Arn for medication management.) Patient states concerns and preferences for aftercare planning are: Pt plans to continue outpatient treatment through [facility]. Patient states they will know when they are safe and ready for discharge when: "The way I feel now is comfortable." Does patient have access to transportation?: Yes Does patient have  financial barriers related to discharge medications?: No Patient description of barriers related to discharge medications: N/A Will patient be returning to same living situation after discharge?: Yes  Summary/Recommendations:   Summary and Recommendations (to be completed by the evaluator): Patient is a 57 year old, single, male from [place], [place] [facility] He shared that he came to the hospital because of a very similiar issue from previous encounter as pt forgot to take his medication for some time. However, through chart review, CSW sees that pt was IVCd by family again. He expressed that since he is here, he would like to work on "that (taking his medication/getting back on medication) and anxiety, depression." Pt lives in a family home with his older brother and plans to return there upon discharge. He works as a Information systems manager and does not have any insurance noted in the chart. Pt denied any history of trauma or abuse. He shared a history of cocaine use with a recent relapse with last use on Monday, 11/03/22. Pt was unable to recall how much he used but did share that he was using approximately every two days. Previously did treatment through [facility] in [place], [place]. Pt currently receives outpatient services through [facility], where he sees Lorriane Shire for therapy and Dr. Jamse Arn for medication management. Upon discharge pt plans to continue with his current outpatient provider. Recommendations include crisis stabilization, therapeutic milieu, encourage group attendance and participation, medication management for mood stabilization and development of a comprehensive sobriety/mental wellness plan.  Shirl Harris. 11/06/2022

## 2022-11-06 NOTE — Group Note (Signed)
[facility] LCSW Group Therapy Note   Group Date: 11/06/2022 Start Time: 1300 End Time: 1400   Type of Therapy/Topic:  Group Therapy:  Balance in Life  Participation Level:  Did Not Attend   Description of Group:    This group will address the concept of balance and how it feels and looks when one is unbalanced. Patients will be encouraged to process areas in their lives that are out of balance, and identify reasons for remaining unbalanced. Facilitators will guide patients utilizing problem- solving interventions to address and correct the stressor making their life unbalanced. Understanding and applying boundaries will be explored and addressed for obtaining  and maintaining a balanced life. Patients will be encouraged to explore ways to assertively make their unbalanced needs known to significant others in their lives, using other group members and facilitator for support and feedback.  Therapeutic Goals: Patient will identify two or more emotions or situations they have that consume much of in their lives. Patient will identify signs/triggers that life has become out of balance:  Patient will identify two ways to set boundaries in order to achieve balance in their lives:  Patient will demonstrate ability to communicate their needs through discussion and/or role plays  Summary of Patient Progress: X   Therapeutic Modalities:   Cognitive Behavioral Therapy Solution-Focused Therapy Assertiveness Training   Jonn Chaikin R Carole Doner, LCSW 

## 2022-11-06 NOTE — Plan of Care (Signed)
  Problem: Education: Goal: Knowledge of General Education information will improve Description: Including pain rating scale, medication(s)/side effects and non-pharmacologic comfort measures Outcome: Adequate for Discharge   Problem: Health Behavior/Discharge Planning: Goal: Ability to manage health-related needs will improve Outcome: Adequate for Discharge   Problem: Education: Goal: Emotional status will improve Outcome: Adequate for Discharge

## 2022-11-06 NOTE — Progress Notes (Signed)
Patient calm and cooperative during assessment denying SI/HI/AVH. Pt endorses anxiety. Pt stated he just needs to get some good sleep. Pt compliant with medication administration per MD orders. Pt given education, support, and encouragement to be active in his treatment plan. Pt being monitored Q 15 minutes for safety per unit protocol, remains safe on the unit

## 2022-11-06 NOTE — Progress Notes (Signed)
[facility] MD Progress Note  11/06/2022 2:16 PM Adam Cannon  MRN:  [id number] Subjective: Follow-up 57 year old man with schizophrenia and substance abuse.  Patient states that he is feeling a little better but is still tired.  Catching up on sleep.  Not reporting any current hallucinations.  Not showing any dangerous behavior.  He did speak with the representative of [facility] today and indicated that he would be agreeable to following up with their intensive outpatient substance abuse program. Principal Problem: Paranoid schizophrenia (HCC) Diagnosis: Principal Problem:   Paranoid schizophrenia (HCC) Active Problems:   Cocaine use   Schizophrenia, paranoid (HCC)  Total Time spent with patient: 20 minutes  Past Psychiatric History: History of recurrent episodes coming to the hospital with return of behavior and psychotic symptoms usually in the context of cocaine abuse  Past Medical History:  Past Medical History:  Diagnosis Date   Environmental and seasonal allergies     Past Surgical History:  Procedure Laterality Date   NO PAST SURGERIES     Family History:  Family History  Problem Relation Age of Onset   Hypertension Mother    Breast cancer Mother    Hypertension Father    Healthy Sister    Healthy Brother    Hypertension Maternal Uncle    Heart attack Maternal Uncle    Prostate cancer Neg Hx    Family Psychiatric  History: See previous Social History:  Social History   Substance and Sexual Activity  Alcohol Use No     Social History   Substance and Sexual Activity  Drug Use Never    Social History   Socioeconomic History   Marital status: Single    Spouse name: Not on file   Number of children: Not on file   Years of education: Not on file   Highest education level: Associate degree: occupational, Scientist, product/process development, or vocational program  Occupational History   Not on file  Tobacco Use   Smoking status: Never   Smokeless tobacco: Never  Vaping Use   Vaping Use: Never  used  Substance and Sexual Activity   Alcohol use: No   Drug use: Never   Sexual activity: Yes  Other Topics Concern   Not on file  Social History Narrative   Not on file   Social Determinants of Health   Financial Resource Strain: Low Risk  (11/01/2018)   Overall Financial Resource Strain (CARDIA)    Difficulty of Paying Living Expenses: Not hard at all  Food Insecurity: No Food Insecurity (11/05/2022)   Hunger Vital Sign    Worried About Running Out of Food in the Last Year: Never true    Ran Out of Food in the Last Year: Never true  Transportation Needs: No Transportation Needs (11/05/2022)   PRAPARE - Administrator, Civil Service (Medical): No    Lack of Transportation (Non-Medical): No  Physical Activity: Sufficiently Active (11/01/2018)   Exercise Vital Sign    Days of Exercise per Week: 3 days    Minutes of Exercise per Session: 60 min  Stress: Not on file  Social Connections: Not on file   Additional Social History:                         Sleep: Fair  Appetite:  Fair  Current Medications: Current Facility-Administered Medications  Medication Dose Route Frequency Provider Last Rate Last Admin   acetaminophen (TYLENOL) tablet 650 mg  650 mg Oral Q6H PRN  Makai Dumond, [name], MD       alum & mag hydroxide-simeth (MAALOX/MYLANTA) 200-200-20 MG/5ML suspension 30 mL  30 mL Oral Q4H PRN Vina Byrd, [name], MD       buPROPion (WELLBUTRIN XL) 24 hr tablet 300 mg  300 mg Oral q morning Lori-Ann Lindfors T, MD   300 mg at 11/06/22 2671   hydrOXYzine (ATARAX) tablet 25-50 mg  25-50 mg Oral QHS PRN Aitana Burry, [name], MD       hydrOXYzine (ATARAX) tablet 50 mg  50 mg Oral TID PRN Rayna Brenner, [name], MD       magnesium hydroxide (MILK OF MAGNESIA) suspension 30 mL  30 mL Oral Daily PRN Leisl Spurrier T, MD       PARoxetine (PAXIL) tablet 20 mg  20 mg Oral QHS Ananias Kolander T, MD   20 mg at 11/05/22 2149   tamsulosin (FLOMAX) capsule 0.4 mg  0.4 mg Oral QPC supper Curry Dulski,  Livier Hendel T, MD   0.4 mg at 11/05/22 1756   thiothixene (NAVANE) capsule 10 mg  10 mg Oral BID Maryiah Olvey, [name], MD   10 mg at 11/06/22 2458   traZODone (DESYREL) tablet 100 mg  100 mg Oral QHS PRN Devlynn Knoff, [name], MD       traZODone (DESYREL) tablet 200 mg  200 mg Oral QHS Avery Klingbeil T, MD   200 mg at 11/05/22 2149    Lab Results:  Results for orders placed or performed during the hospital encounter of 11/05/22 (from the past 48 hour(s))  Hemoglobin A1c     Status: None   Collection Time: 11/06/22  6:54 AM  Result Value Ref Range   Hgb A1c MFr Bld 5.4 4.8 - 5.6 %    Comment: (NOTE) Pre diabetes:          5.7%-6.4%  Diabetes:              >6.4%  Glycemic control for   <7.0% adults with diabetes    Mean Plasma Glucose 108.28 mg/dL    Comment: Performed at [facility], [place] [address] [place], [place] [address]  Lipid panel     Status: Abnormal   Collection Time: 11/06/22  6:54 AM  Result Value Ref Range   Cholesterol 214 (H) 0 - 200 mg/dL   Triglycerides 64 <150 mg/dL   HDL 75 >40 mg/dL   Total CHOL/HDL Ratio 2.9 RATIO   VLDL 13 0 - 40 mg/dL   LDL Cholesterol 126 (H) 0 - 99 mg/dL    Comment:        Total Cholesterol/HDL:CHD Risk Coronary Heart Disease Risk Table                     Men   Women  1/2 Average Risk   3.4   3.3  Average Risk       5.0   4.4  2 X Average Risk   9.6   7.1  3 X Average Risk  23.4   11.0        Use the calculated Patient Ratio above and the CHD Risk Table to determine the patient's CHD Risk.        ATP III CLASSIFICATION (LDL):  <100     mg/dL   Optimal  100-129  mg/dL   Near or Above                    Optimal  130-159  mg/dL  Borderline  160-189  mg/dL   High  >914     mg/dL   Very High Performed at [facility], [address] [place], [place] [address]     Blood Alcohol level:  Lab Results  Component Value Date   [facility] <10 11/05/2022   ETH <10 10/01/2022    Metabolic Disorder Labs: Lab Results  Component Value  Date   HGBA1C 5.4 11/06/2022   MPG 108.28 11/06/2022   MPG 128 01/17/2022   No results found for: "PROLACTIN" Lab Results  Component Value Date   CHOL 214 (H) 11/06/2022   TRIG 64 11/06/2022   HDL 75 11/06/2022   CHOLHDL 2.9 11/06/2022   VLDL 13 11/06/2022   LDLCALC 126 (H) 11/06/2022   LDLCALC 162 (H) 01/17/2022    Physical Findings: AIMS: Facial and Oral Movements Muscles of Facial Expression: None, normal Lips and Perioral Area: None, normal Jaw: None, normal Tongue: None, normal,Extremity Movements Upper (arms, wrists, hands, fingers): None, normal Lower (legs, knees, ankles, toes): None, normal, Trunk Movements Neck, shoulders, hips: None, normal, Overall Severity Severity of abnormal movements (highest score from questions above): None, normal Incapacitation due to abnormal movements: None, normal Patient's awareness of abnormal movements (rate only patient's report): No Awareness, Dental Status Current problems with teeth and/or dentures?: No Does patient usually wear dentures?: No  CIWA:    COWS:     Musculoskeletal: Strength & Muscle Tone: within normal limits Gait & Station: normal Patient leans: N/A  Psychiatric Specialty Exam:  Presentation  General Appearance:  Casual  Eye Contact: Fair  Speech: Clear and Coherent  Speech Volume: Normal  Handedness: Right   Mood and Affect  Mood: Labile  Affect: Congruent   Thought Process  Thought Processes: Goal Directed  Descriptions of Associations:Intact  Orientation:Full (Time, Place and Person)  Thought Content:Paranoid Ideation; Delusions  History of Schizophrenia/Schizoaffective disorder:Yes  Duration of Psychotic Symptoms:Less than six months  Hallucinations:Hallucinations: None  Ideas of Reference:Delusions; Paranoia  Suicidal Thoughts:Suicidal Thoughts: No  Homicidal Thoughts:Homicidal Thoughts: No   Sensorium  Memory: Immediate  Fair  Judgment: Poor  Insight: Fair   Chartered certified accountant: Fair  Attention Span: Fair  Recall: Fair  Fund of Knowledge: Fair  Language: Good   Psychomotor Activity  Psychomotor Activity: Psychomotor Activity: Normal   Assets  Assets: Communication Skills; Desire for Improvement; Housing; Resilience   Sleep  Sleep: Sleep: Fair    Physical Exam: Physical Exam Vitals and nursing note reviewed.  Constitutional:      Appearance: Normal appearance.  HENT:     Head: Normocephalic and atraumatic.     Mouth/Throat:     Pharynx: Oropharynx is clear.  Eyes:     Pupils: Pupils are equal, round, and reactive to light.  Cardiovascular:     Rate and Rhythm: Normal rate and regular rhythm.  Pulmonary:     Effort: Pulmonary effort is normal.     Breath sounds: Normal breath sounds.  Abdominal:     General: Abdomen is flat.     Palpations: Abdomen is soft.  Musculoskeletal:        General: Normal range of motion.  Skin:    General: Skin is warm and dry.  Neurological:     General: No focal deficit present.     Mental Status: He is alert. Mental status is at baseline.  Psychiatric:        Attention and Perception: Attention normal.        Mood and Affect: Mood  normal.        Speech: Speech is delayed.        Behavior: Behavior is cooperative.        Thought Content: Thought content normal.        Cognition and Memory: Cognition normal.    Review of Systems  Constitutional:  Positive for malaise/fatigue.  HENT: Negative.    Eyes: Negative.   Respiratory: Negative.    Cardiovascular: Negative.   Gastrointestinal: Negative.   Musculoskeletal: Negative.   Skin: Negative.   Neurological: Negative.   Psychiatric/Behavioral: Negative.     Blood pressure (!) 134/94, pulse (!) 112, temperature 99.1 F (37.3 C), temperature source Oral, resp. rate 18, height 5\' 10"  (1.778 m), weight 72.6 kg, SpO2 97 %. Body mass index is 22.96  kg/m.   Treatment Plan Summary: Medication management and Plan medicines appears stable.  I think it would be best to give him until tomorrow for discharge.  We will go ahead and start making preparations for probable discharge tomorrow morning and then follow-up at [facility].  Alethia Berthold, MD 11/06/2022, 2:16 PM

## 2022-11-07 ENCOUNTER — Other Ambulatory Visit: Payer: Self-pay

## 2022-11-07 NOTE — Discharge Summary (Signed)
Physician Discharge Summary Note  Patient:  Adam Cannon is an 57 y.o., male MRN:  [id number] DOB:  1966-03-19 Patient phone:  [phone] (home)  Patient address:   [place] [address],  Total Time spent with patient: 30 minutes  Date of Admission:  11/05/2022 Date of Discharge: 11/07/2022  Reason for Admission: Patient was admitted after presenting to the hospital under IVC with reports that he had made aggressive or suicidal statements and had a return of psychotic symptoms.  Patient acknowledged having psychotic symptoms when he came to the hospital and acknowledged recent relapse into cocaine use.  Principal Problem: Paranoid schizophrenia [facility] Discharge Diagnoses: Principal Problem:   Paranoid schizophrenia ([place]) Active Problems:   Cocaine use   Schizophrenia, paranoid ([place])   Past Psychiatric History: Past history of episodes similar to this.  Carries a diagnosis of schizophrenia and substance use problems  Past Medical History:  Past Medical History:  Diagnosis Date   Environmental and seasonal allergies     Past Surgical History:  Procedure Laterality Date   NO PAST SURGERIES     Family History:  Family History  Problem Relation Age of Onset   Hypertension Mother    Breast cancer Mother    Hypertension Father    Healthy Sister    Healthy Brother    Hypertension Maternal Uncle    Heart attack Maternal Uncle    Prostate cancer Neg Hx    Family Psychiatric  History: See previous Social History:  Social History   Substance and Sexual Activity  Alcohol Use No     Social History   Substance and Sexual Activity  Drug Use Never    Social History   Socioeconomic History   Marital status: Single    Spouse name: Not on file   Number of children: Not on file   Years of education: Not on file   Highest education level: Associate degree: occupational, Hotel manager, or vocational program  Occupational History   Not on file  Tobacco Use    Smoking status: Never   Smokeless tobacco: Never  Vaping Use   Vaping Use: Never used  Substance and Sexual Activity   Alcohol use: No   Drug use: Never   Sexual activity: Yes  Other Topics Concern   Not on file  Social History Narrative   Not on file   Social Determinants of Health   Financial Resource Strain: Low Risk  (11/01/2018)   Overall Financial Resource Strain (CARDIA)    Difficulty of Paying Living Expenses: Not hard at all  Food Insecurity: No Food Insecurity (11/05/2022)   Hunger Vital Sign    Worried About Running Out of Food in the Last Year: Never true    Ran Out of Food in the Last Year: Never true  Transportation Needs: No Transportation Needs (11/05/2022)   PRAPARE - Hydrologist (Medical): No    Lack of Transportation (Non-Medical): No  Physical Activity: Sufficiently Active (11/01/2018)   Exercise Vital Sign    Days of Exercise per Week: 3 days    Minutes of Exercise per Session: 60 min  Stress: Not on file  Social Connections: Not on file    Hospital Course: Patient admitted to psychiatric unit.  15-minute checks continued.  Patient did not display any dangerous behavior in the hospital.  He has denied any suicidal or homicidal thought.  He was restarted on medication consistent with what he had been taking in the past.  Patient met with representative from [facility] and stated an agreement that he will follow-up with the substance abuse intensive outpatient program.  Met with treatment team today.  Currently no sign of dangerousness and no longer meets commitment criteria.  Patient will be discharged home with the prescription for his medicine as well as a supply of medicines and a scheduled plan to follow-up with [facility].  Physical Findings: AIMS: Facial and Oral Movements Muscles of Facial Expression: None, normal Lips and Perioral Area: None, normal Jaw: None, normal Tongue: None, normal,Extremity Movements Upper (arms, wrists, hands,  fingers): None, normal Lower (legs, knees, ankles, toes): None, normal, Trunk Movements Neck, shoulders, hips: None, normal, Overall Severity Severity of abnormal movements (highest score from questions above): None, normal Incapacitation due to abnormal movements: None, normal Patient's awareness of abnormal movements (rate only patient's report): No Awareness, Dental Status Current problems with teeth and/or dentures?: No Does patient usually wear dentures?: No  CIWA:    COWS:     Musculoskeletal: Strength & Muscle Tone: within normal limits Gait & Station: normal Patient leans: N/A   Psychiatric Specialty Exam:  Presentation  General Appearance:  Casual  Eye Contact: Fair  Speech: Clear and Coherent  Speech Volume: Normal  Handedness: Right   Mood and Affect  Mood: Labile  Affect: Congruent   Thought Process  Thought Processes: Goal Directed  Descriptions of Associations:Intact  Orientation:Full (Time, Place and Person)  Thought Content:Paranoid Ideation; Delusions  History of Schizophrenia/Schizoaffective disorder:Yes  Duration of Psychotic Symptoms:Less than six months  Hallucinations:No data recorded Ideas of Reference:Delusions; Paranoia  Suicidal Thoughts:No data recorded Homicidal Thoughts:No data recorded  Sensorium  Memory: Immediate Fair  Judgment: Poor  Insight: Fair   Chartered certified accountant: Fair  Attention Span: Fair  Recall: [employer] of Knowledge: Fair  Language: Good   Psychomotor Activity  Psychomotor Activity:No data recorded  Assets  Assets: Communication Skills; Desire for Improvement; Housing; Resilience   Sleep  Sleep:No data recorded   Physical Exam: Physical Exam Vitals and nursing note reviewed.  Constitutional:      Appearance: Normal appearance.  HENT:     Head: Normocephalic and atraumatic.     Mouth/Throat:     Pharynx: Oropharynx is clear.  Eyes:     Pupils:  Pupils are equal, round, and reactive to light.  Cardiovascular:     Rate and Rhythm: Normal rate and regular rhythm.  Pulmonary:     Effort: Pulmonary effort is normal.     Breath sounds: Normal breath sounds.  Abdominal:     General: Abdomen is flat.     Palpations: Abdomen is soft.  Musculoskeletal:        General: Normal range of motion.  Skin:    General: Skin is warm and dry.  Neurological:     General: No focal deficit present.     Mental Status: He is alert. Mental status is at baseline.  Psychiatric:        Attention and Perception: Attention normal.        Mood and Affect: Mood normal.        Speech: Speech normal.        Behavior: Behavior is cooperative.        Thought Content: Thought content normal.        Cognition and Memory: Cognition normal.        Judgment: Judgment normal.    Review of Systems  Constitutional: Negative.   HENT: Negative.    Eyes:  Negative.   Respiratory: Negative.    Cardiovascular: Negative.   Gastrointestinal: Negative.   Musculoskeletal: Negative.   Skin: Negative.   Neurological: Negative.   Psychiatric/Behavioral: Negative.     Blood pressure (!) 132/98, pulse (!) 112, temperature 98.5 F (36.9 C), temperature source Oral, resp. rate 18, height 5\' 10"  (1.778 m), weight 72.6 kg, SpO2 98 %. Body mass index is 22.96 kg/m.   Social History   Tobacco Use  Smoking Status Never  Smokeless Tobacco Never   Tobacco Cessation:  N/A, patient does not currently use tobacco products   Blood Alcohol level:  Lab Results  Component Value Date   ETH <10 11/05/2022   ETH <10 58/06/9832    Metabolic Disorder Labs:  Lab Results  Component Value Date   HGBA1C 5.4 11/06/2022   MPG 108.28 11/06/2022   MPG 128 01/17/2022   No results found for: "PROLACTIN" Lab Results  Component Value Date   CHOL 214 (H) 11/06/2022   TRIG 64 11/06/2022   HDL 75 11/06/2022   CHOLHDL 2.9 11/06/2022   VLDL 13 11/06/2022   LDLCALC 126 (H)  11/06/2022   LDLCALC 162 (H) 01/17/2022    See Psychiatric Specialty Exam and Suicide Risk Assessment completed by Attending Physician prior to discharge.  Discharge destination:  Home  Is patient on multiple antipsychotic therapies at discharge:  No   Has Patient had three or more failed trials of antipsychotic monotherapy by history:  No  Recommended Plan for Multiple Antipsychotic Therapies: NA  Discharge Instructions     Diet - low sodium heart healthy   Complete by: As directed    Increase activity slowly   Complete by: As directed       Allergies as of 11/07/2022       Reactions   Mucinex [guaifenesin Er] Nausea And Vomiting   Allergic to Liquid form only per patient on 12/15/2019 call   Aspirin Rash   Remote past, has taken recently and had no hives (10/2018)        Medication List     TAKE these medications      Indication  buPROPion 300 MG 24 hr tablet Commonly known as: WELLBUTRIN XL Take 1 tablet (300 mg total) by mouth every morning.  Indication: Major Depressive Disorder   hydrOXYzine 25 MG tablet Commonly known as: ATARAX Take 1-2 tablets (25-50 mg total) by mouth at bedtime as needed.  Indication: Feeling Anxious   PARoxetine 20 MG tablet Commonly known as: PAXIL Take 1 tablet (20 mg total) by mouth at bedtime.  Indication: Major Depressive Disorder   tamsulosin 0.4 MG Caps capsule Commonly known as: FLOMAX Take 1 capsule (0.4 mg total) by mouth daily after supper.  Indication: Benign Enlargement of Prostate   thiothixene 10 MG capsule Commonly known as: NAVANE Take 1 capsule (10 mg total) by mouth 2 (two) times daily.  Indication: Schizophrenia   traZODone 100 MG tablet Commonly known as: DESYREL Take 2 tablets (200 mg total) by mouth at bedtime.  Indication: [place] Follow up.   Contact information: [place]  [address] [phone]                 Follow-up recommendations:  Other:  Counseling regarding substance use and mental health treatment provided.  Patient is to follow-up with outpatient treatment for both conditions.  Prescriptions provided as well as a  supply of medicine.  Comments: See above  Signed: Mordecai Rasmussen, MD 11/07/2022, 10:14 AM

## 2022-11-07 NOTE — BH IP Treatment Plan (Signed)
Interdisciplinary Treatment and Diagnostic Plan Update  11/07/2022 Time of Session: 09:40 Adam Cannon MRN: [id number]  Principal Diagnosis: Paranoid schizophrenia [facility])  Secondary Diagnoses: Principal Problem:   Paranoid schizophrenia ([name]) Active Problems:   Cocaine use   Schizophrenia, paranoid ([name])   Current Medications:  Current Facility-Administered Medications  Medication Dose Route Frequency Provider Last Rate Last Admin   acetaminophen (TYLENOL) tablet 650 mg  650 mg Oral Q6H PRN Clapacs, [name], MD       alum & mag hydroxide-simeth (MAALOX/MYLANTA) 200-200-20 MG/5ML suspension 30 mL  30 mL Oral Q4H PRN Clapacs, [name], MD       buPROPion (WELLBUTRIN XL) 24 hr tablet 300 mg  300 mg Oral q morning Clapacs, [name], MD   300 mg at 11/06/22 0962   hydrOXYzine (ATARAX) tablet 25-50 mg  25-50 mg Oral QHS PRN Clapacs, [name], MD       hydrOXYzine (ATARAX) tablet 50 mg  50 mg Oral TID PRN Clapacs, [name], MD       magnesium hydroxide (MILK OF MAGNESIA) suspension 30 mL  30 mL Oral Daily PRN Clapacs, [name], MD   30 mL at 11/07/22 8366   PARoxetine (PAXIL) tablet 20 mg  20 mg Oral QHS Clapacs, [name], MD   20 mg at 11/06/22 2121   tamsulosin (FLOMAX) capsule 0.4 mg  0.4 mg Oral QPC supper Clapacs, [name], MD   0.4 mg at 11/06/22 1641   thiothixene (NAVANE) capsule 10 mg  10 mg Oral BID Clapacs, [name], MD   10 mg at 11/07/22 2947   traZODone (DESYREL) tablet 100 mg  100 mg Oral QHS PRN Clapacs, [name], MD       traZODone (DESYREL) tablet 200 mg  200 mg Oral QHS Clapacs, [name], MD   200 mg at 11/06/22 2121   PTA Medications: Medications Prior to Admission  Medication Sig Dispense Refill Last Dose   [DISCONTINUED] buPROPion (WELLBUTRIN XL) 300 MG 24 hr tablet Take 1 tablet (300 mg total) by mouth every morning. 30 tablet 0    [DISCONTINUED] hydrOXYzine (ATARAX) 25 MG tablet Take 1-2 tablets (25-50 mg total) by mouth at bedtime as needed. 30 tablet 0    [DISCONTINUED] PARoxetine  (PAXIL) 20 MG tablet Take 1 tablet (20 mg total) by mouth at bedtime. 30 tablet 0    [DISCONTINUED] tamsulosin (FLOMAX) 0.4 MG CAPS capsule Take 1 capsule (0.4 mg total) by mouth daily after supper. 30 capsule 0    [DISCONTINUED] thiothixene (NAVANE) 10 MG capsule Take 1 capsule (10 mg total) by mouth 2 (two) times daily. 60 capsule 0    [DISCONTINUED] traZODone (DESYREL) 100 MG tablet Take 2 tablets (200 mg total) by mouth at bedtime. 60 tablet 0     Patient Stressors: Marital or family conflict   Medication change or noncompliance   Substance abuse    Patient Strengths: Supportive family/friends  Work skills   Treatment Modalities: Medication Management, Group therapy, Case management,  1 to 1 session with clinician, Psychoeducation, Recreational therapy.   Physician Treatment Plan for Primary Diagnosis: Paranoid schizophrenia ([place]) Long Term Goal(s): Improvement in symptoms so as ready for discharge   Short Term Goals: Ability to identify and develop effective coping behaviors will improve Ability to maintain clinical measurements within normal limits will improve Ability to verbalize feelings will improve Ability to demonstrate self-control will improve  Medication Management: Evaluate patient's response, side effects, and tolerance of medication regimen.  Therapeutic Interventions:  1 to 1 sessions, Unit Group sessions and Medication administration.  Evaluation of Outcomes: Adequate for Discharge  Physician Treatment Plan for Secondary Diagnosis: Principal Problem:   Paranoid schizophrenia ([facility]) Active Problems:   Cocaine use   Schizophrenia, paranoid ([facility])  Long Term Goal(s): Improvement in symptoms so as ready for discharge   Short Term Goals: Ability to identify and develop effective coping behaviors will improve Ability to maintain clinical measurements within normal limits will improve Ability to verbalize feelings will improve Ability to demonstrate self-control  will improve     Medication Management: Evaluate patient's response, side effects, and tolerance of medication regimen.  Therapeutic Interventions: 1 to 1 sessions, Unit Group sessions and Medication administration.  Evaluation of Outcomes: Adequate for Discharge   RN Treatment Plan for Primary Diagnosis: Paranoid schizophrenia ([facility]) Long Term Goal(s): Knowledge of disease and therapeutic regimen to maintain health will improve  Short Term Goals: Ability to remain free from injury will improve, Ability to verbalize frustration and anger appropriately will improve, Ability to demonstrate self-control, Ability to participate in decision making will improve, Ability to verbalize feelings will improve, Ability to disclose and discuss suicidal ideas, Ability to identify and develop effective coping behaviors will improve, and Compliance with prescribed medications will improve  Medication Management: RN will administer medications as ordered by provider, will assess and evaluate patient's response and provide education to patient for prescribed medication. RN will report any adverse and/or side effects to prescribing provider.  Therapeutic Interventions: 1 on 1 counseling sessions, Psychoeducation, Medication administration, Evaluate responses to treatment, Monitor vital signs and CBGs as ordered, Perform/monitor CIWA, COWS, AIMS and Fall Risk screenings as ordered, Perform wound care treatments as ordered.  Evaluation of Outcomes: Adequate for Discharge   LCSW Treatment Plan for Primary Diagnosis: Paranoid schizophrenia (HCC) Long Term Goal(s): Safe transition to appropriate next level of care at discharge, Engage patient in therapeutic group addressing interpersonal concerns.  Short Term Goals: Engage patient in aftercare planning with referrals and resources, Increase social support, Increase ability to appropriately verbalize feelings, Increase emotional regulation, Facilitate acceptance of  mental health diagnosis and concerns, Facilitate patient progression through stages of change regarding substance use diagnoses and concerns, Identify triggers associated with mental health/substance abuse issues, and Increase skills for wellness and recovery  Therapeutic Interventions: Assess for all discharge needs, 1 to 1 time with Social worker, Explore available resources and support systems, Assess for adequacy in community support network, Educate family and significant other(s) on suicide prevention, Complete Psychosocial Assessment, Interpersonal group therapy.  Evaluation of Outcomes: Adequate for Discharge   Progress in Treatment: Attending groups: No. Participating in groups: No. Taking medication as prescribed: Yes. Toleration medication: Yes. Family/Significant other contact made: No, will contact:  pt declined. Patient understands diagnosis: Yes. Discussing patient identified problems/goals with staff: Yes. Medical problems stabilized or resolved: Yes. Denies suicidal/homicidal ideation: Yes. Issues/concerns per patient self-inventory: No. Other: none.  New problem(s) identified: No, Describe:  none identified.  New Short Term/Long Term Goal(s): elimination of symptoms of psychosis, medication management for mood stabilization; elimination of SI thoughts; development of comprehensive mental wellness/sobriety plan.  Patient Goals:  "Really get back on my meds on a daily basis."  Discharge Plan or Barriers: Pt plans to discharge today returning back home with follow up through [facility].   Reason for Continuation of Hospitalization: Aggression Homicidal ideation Medication stabilization  Estimated Length of Stay: 1-7 days  Last 3 [place] Suicide Severity Risk Score: Flowsheet Row Admission (Current) from 11/05/20[facility] in [facility]  INPATIENT BEHAVIORAL MEDICINE Most recent reading at 11/05/2022  1:40 PM Admission (Discharged) from 10/01/2022 in [place] Most recent reading at 10/01/2022  8:23 PM ED from 10/01/2022 in [place] Most recent reading at 10/01/2022  9:44 AM  C-SSRS RISK CATEGORY No Risk No Risk No Risk       Last PHQ 2/9 Scores:    01/17/2022    8:58 AM 12/20/2021    1:38 PM 12/15/2019    1:17 PM  Depression screen PHQ 2/9  Decreased Interest 0 0 0  Down, Depressed, Hopeless 0 0 0  PHQ - 2 Score 0 0 0  Altered sleeping 0 0   Tired, decreased energy 0 0   Change in appetite 0 0   Feeling bad or failure about yourself  0 0   Trouble concentrating 0 0   Moving slowly or fidgety/restless 0 0   Suicidal thoughts 0 0   PHQ-9 Score 0 0   Difficult doing work/chores Not difficult at all Not difficult at all     Scribe for Treatment Team: Shirl Harris, LCSW 11/07/2022 10:18 AM

## 2022-11-07 NOTE — Progress Notes (Signed)
  [facility] Adult Case Management Discharge Plan :  Will you be returning to the same living situation after discharge:  Yes,  pt plans to return home upon discharge. At discharge, do you have transportation home?: Yes,  pt sister to provide transportation.  Do you have the ability to pay for your medications: No.  Release of information consent forms completed and in the chart;  Patient's signature needed at discharge.  Patient to Follow up at:  Follow-up Information     [facility] Follow up.   Why: They have walk-in hours Mondays, Wednesdays, and Friday, 8am-4pm. Clients are seen on a first come, first served basis so it is recommended that you arrive prior to 7:30am to be seen the same day. Thanks! Contact information: [place] [address] [phone]                 Next level of care provider has access to [place] and Suicide Prevention discussed: Yes,  SPE completed with pt as he declined collateral/familial contact.     Has patient been referred to the Quitline?: N/A patient is not a smoker  Patient has been referred for addiction treatment: Yes  Shirl Harris, LCSW 11/07/2022, 1:17 PM

## 2022-11-07 NOTE — BHH Suicide Risk Assessment (Signed)
[facility] Discharge Suicide Risk Assessment   Principal Problem: Paranoid schizophrenia ([place]) Discharge Diagnoses: Principal Problem:   Paranoid schizophrenia ([place]) Active Problems:   Cocaine use   Schizophrenia, paranoid ([place])   Total Time spent with patient: 30 minutes  Musculoskeletal: Strength & Muscle Tone: within normal limits Gait & Station: normal Patient leans: N/A  Psychiatric Specialty Exam  Presentation  General Appearance:  Casual  Eye Contact: Fair  Speech: Clear and Coherent  Speech Volume: Normal  Handedness: Right   Mood and Affect  Mood: Labile  Duration of Depression Symptoms: Greater than two weeks  Affect: Congruent   Thought Process  Thought Processes: Goal Directed  Descriptions of Associations:Intact  Orientation:Full (Time, Place and Person)  Thought Content:Paranoid Ideation; Delusions  History of Schizophrenia/Schizoaffective disorder:Yes  Duration of Psychotic Symptoms:Less than six months  Hallucinations:No data recorded Ideas of Reference:Delusions; Paranoia  Suicidal Thoughts:No data recorded Homicidal Thoughts:No data recorded  Sensorium  Memory: Immediate Fair  Judgment: Poor  Insight: Fair   Materials engineer: Fair  Attention Span: Fair  Recall: [employer] of Knowledge: Fair  Language: Good   Psychomotor Activity  Psychomotor Activity:No data recorded  Assets  Assets: Communication Skills; Desire for Improvement; Housing; Resilience   Sleep  Sleep:No data recorded  Physical Exam: Physical Exam Vitals and nursing note reviewed.  Constitutional:      Appearance: Normal appearance.  HENT:     Head: Normocephalic and atraumatic.     Mouth/Throat:     Pharynx: Oropharynx is clear.  Eyes:     Pupils: Pupils are equal, round, and reactive to light.  Cardiovascular:     Rate and Rhythm: Normal rate and regular rhythm.  Pulmonary:     Effort: Pulmonary effort is  normal.     Breath sounds: Normal breath sounds.  Abdominal:     General: Abdomen is flat.     Palpations: Abdomen is soft.  Musculoskeletal:        General: Normal range of motion.  Skin:    General: Skin is warm and dry.  Neurological:     General: No focal deficit present.     Mental Status: He is alert. Mental status is at baseline.  Psychiatric:        Mood and Affect: Mood normal.        Thought Content: Thought content normal.    Review of Systems  Constitutional: Negative.   HENT: Negative.    Eyes: Negative.   Respiratory: Negative.    Cardiovascular: Negative.   Gastrointestinal: Negative.   Musculoskeletal: Negative.   Skin: Negative.   Neurological: Negative.   Psychiatric/Behavioral: Negative.     Blood pressure (!) 132/98, pulse (!) 112, temperature 98.5 F (36.9 C), temperature source Oral, resp. rate 18, height 5\' 10"  (1.778 m), weight 72.6 kg, SpO2 98 %. Body mass index is 22.96 kg/m.  Mental Status Per Nursing Assessment::   On Admission:  NA  Demographic Factors:  Male and Unemployed  Loss Factors: NA  Historical Factors: Impulsivity  Risk Reduction Factors:   Positive social support and Positive therapeutic relationship  Continued Clinical Symptoms:  Alcohol/Substance Abuse/Dependencies Schizophrenia:   Paranoid or undifferentiated type  Cognitive Features That Contribute To Risk:  None    Suicide Risk:  Minimal: No identifiable suicidal ideation.  Patients presenting with no risk factors but with morbid ruminations; may be classified as minimal risk based on the severity of the depressive symptoms   Follow-up Information     [facility]  [place] Follow up.   Contact information: [place] [address] [phone]                 Plan Of Care/Follow-up recommendations:  Other:  Patient seen in patient attended treatment team.  Patient's affect is calm and appropriate.  Thoughts appear to be  lucid.  Taking care of himself well.  Denies any suicidality or dangerous thinking.  Agrees to continue medication.  He will be followed up at [facility].  Alethia Berthold, MD 11/07/2022, 10:12 AM

## 2022-11-07 NOTE — Progress Notes (Signed)
Discharge note: SSP and survey complete. RN met with pt and reviewed pt's discharge instructions. Pt verbalized understanding of discharge instructions and pt did not have any questions. RN reviewed and provided pt with a copy of SRA, AVS and Transition Record. RN returned pt's belongings to pt. Prescriptions and samples were given to pt. Pt denied SI/HI/AVH and voiced no concerns. Pt was appreciative of the care pt received at [facility]. Patient discharged to their ride without incident.  11/07/22 0823  Psych Admission Type (Psych Patients Only)  Admission Status Voluntary  Psychosocial Assessment  Patient Complaints Anxiety;Depression  Eye Contact Fair  Facial Expression Flat  Affect Anxious  Speech Logical/coherent  Interaction Assertive  Motor Activity Other (Comment) (WDL)  Appearance/Hygiene Unremarkable  Behavior Characteristics Cooperative;Appropriate to situation;Anxious  Mood Depressed;Anxious;Pleasant  Aggressive Behavior  Effect No apparent injury  Thought Process  Coherency Circumstantial  Content Blaming others  Delusions None reported or observed  Perception WDL  Hallucination None reported or observed  Judgment Impaired  Confusion None  Danger to Self  Current suicidal ideation? Denies  Danger to Others  Danger to Others None reported or observed  Danger to Others Abnormal  Harmful Behavior to others No threats or harm toward other people  Destructive Behavior No threats or harm toward property

## 2022-12-05 ENCOUNTER — Encounter: Payer: Self-pay | Admitting: Family Medicine

## 2022-12-05 ENCOUNTER — Ambulatory Visit: Payer: Self-pay

## 2022-12-05 ENCOUNTER — Ambulatory Visit: Payer: Medicaid Other | Admitting: Family Medicine

## 2022-12-05 VITALS — BP 140/82 | HR 77 | Ht 70.0 in | Wt 177.2 lb

## 2022-12-05 DIAGNOSIS — N419 Inflammatory disease of prostate, unspecified: Secondary | ICD-10-CM | POA: Diagnosis not present

## 2022-12-05 DIAGNOSIS — G5602 Carpal tunnel syndrome, left upper limb: Secondary | ICD-10-CM

## 2022-12-05 DIAGNOSIS — N342 Other urethritis: Secondary | ICD-10-CM | POA: Diagnosis not present

## 2022-12-05 MED ORDER — LEVOFLOXACIN 500 MG PO TABS: 500.0000 mg | ORAL_TABLET | Freq: Every day | ORAL | 0 refills | Status: DC

## 2022-12-05 MED ORDER — NAPROXEN 500 MG PO TABS: 500.0000 mg | ORAL_TABLET | Freq: Two times a day (BID) | ORAL | 0 refills | Status: DC

## 2022-12-05 NOTE — Telephone Encounter (Signed)
  Chief Complaint: numbness to left forearm to wrist that comes and goes Symptoms: tingling to left hand that stops when he moves his hand and fingers  Frequency: x 1 week Pertinent Negatives: Patient denies headache, dizziness, vision loss, double vision, changes in speech, unsteady on your feet Disposition: []$ ED /[]$ Urgent Care (no appt availability in office) / [x]$ Appointment(In office/virtual)/ []$  [facility] [facility]/ []$ Home Care/ []$ Refused Recommended Disposition /[]$ [place] [facility]/ []$  Follow-up with PCP Additional Notes: after triage- pt mentioned having clear discarge from penis during or after BM. Pt was eval. Last May 8th. No burning or rash, abd pain or fever. Reason for Disposition  [1] Numbness (i.e., loss of sensation) of the face, arm / hand, or leg / foot on one side of the body AND [2] gradual onset (e.g., days to weeks) AND [3] present now  Answer Assessment - Initial Assessment Questions 1. SYMPTOM: "What is the main symptom you are concerned about?" (e.g., weakness, numbness)     *No Answer* 2. ONSET: "When did this start?" (minutes, hours, days; while sleeping)     1 week 3. LAST NORMAL: "When was the last time you (the patient) were normal (no symptoms)?"     2 weeks ago  4. PATTERN "Does this come and go, or has it been constant since it started?"  "Is it present now?"     Comes and goes stays 10 seconds then gone down arm to hand  and hand will tingle x and will move fingers and the tingling subsides immediatly  5. CARDIAC SYMPTOMS: "Have you had any of the following symptoms: chest pain, difficulty breathing, palpitations?"     no 6. NEUROLOGIC SYMPTOMS: "Have you had any of the following symptoms: headache, dizziness, vision loss, double vision, changes in speech, unsteady on your feet?"     no 7. OTHER SYMPTOMS: "Do you have any other symptoms?"     no 8. PREGNANCY: "Is there any chance you are pregnant?" "When was your last menstrual period?"     *No  Answer*  Protocols used: Neurologic Deficit-A-AH

## 2022-12-05 NOTE — Progress Notes (Signed)
Subjective:    Patient ID: Adam Cannon, male    DOB: [birth year], 57 y.o.   MRN: [id number]  Adam Cannon is a 57 y.o. 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 Also at night can flare up Denies any pain with it but has episodic loss of sensation tingling  2002 he had injury to Left wrist, it required orthopedic management, and they did cortisone injection into wrist to heal it.  Urethritis Discharge Prior issue in 2023 had negative STD testing. Resolved w/ treatment empirically. No recent sexual contact since then  He notes that previously he experienced urethral discharge more severe and fairly constant it was worse after BM. 1 week ago he had one more similar episode of urethral discharge after BM with straining.   Not having dysuria, frequency      01/17/2022    8:58 AM 12/20/2021    1:38 PM 12/15/2019    1:17 PM  Depression screen PHQ 2/9  Decreased Interest 0 0 0  Down, Depressed, Hopeless 0 0 0  PHQ - 2 Score 0 0 0  Altered sleeping 0 0   Tired, decreased energy 0 0   Change in appetite 0 0   Feeling bad or failure about yourself  0 0   Trouble concentrating 0 0   Moving slowly or fidgety/restless 0 0   Suicidal thoughts 0 0   PHQ-9 Score 0 0   Difficult doing work/chores Not difficult at all Not difficult at all     Social History   Tobacco Use   Smoking status: Never   Smokeless tobacco: Never  Vaping Use   Vaping Use: Never used  Substance Use Topics   Alcohol use: No   Drug use: Never    Review of Systems Per HPI unless specifically indicated above     Objective:    BP (!) 140/82   Pulse 77   Ht 5' 10"$  (1.778 m)   Wt 177 lb 3.2 oz (80.4 kg)   SpO2 100%   BMI 25.43 kg/m   Wt Readings from Last 3 Encounters:  12/05/22 177 lb 3.2 oz (80.4 kg)   11/05/22 180 lb (81.6 kg)  10/01/22 191 lb (86.6 kg)    Physical Exam Vitals and nursing note reviewed.  Constitutional:      General: He is not in acute distress.    Appearance: Normal appearance. He is well-developed. He is not diaphoretic.     Comments: Well-appearing, comfortable, cooperative  HENT:     Head: Normocephalic and atraumatic.  Eyes:     General:        Right eye: No discharge.        Left eye: No discharge.     Conjunctiva/sclera: Conjunctivae normal.  Cardiovascular:     Rate and Rhythm: Normal rate.  Pulmonary:     Effort: Pulmonary effort is normal.  Skin:    General: Skin is warm and dry.     Findings: No erythema or rash.  Neurological:     Mental Status: He is alert and oriented to person, place, and time.  Psychiatric:        Mood and Affect: Mood normal.        Behavior: Behavior normal.        Thought Content: Thought content  normal.     Comments: Well groomed, good eye contact, normal speech and thoughts    Results for orders placed or performed during the hospital encounter of 11/05/22  Resp panel by RT-PCR (RSV, Flu A&B, Covid) Anterior Nasal Swab   Specimen: Anterior Nasal Swab  Result Value Ref Range   SARS Coronavirus 2 by RT PCR NEGATIVE NEGATIVE   Influenza A by PCR NEGATIVE NEGATIVE   Influenza B by PCR NEGATIVE NEGATIVE   Resp Syncytial Virus by PCR NEGATIVE NEGATIVE  Comprehensive metabolic panel  Result Value Ref Range   Sodium 138 135 - 145 mmol/L   Potassium 3.4 (L) 3.5 - 5.1 mmol/L   Chloride 104 98 - 111 mmol/L   CO2 24 22 - 32 mmol/L   Glucose, Bld 110 (H) 70 - 99 mg/dL   BUN 12 6 - 20 mg/dL   Creatinine, Ser 1.14 0.61 - 1.24 mg/dL   Calcium 9.1 8.9 - 10.3 mg/dL   Total Protein 7.4 6.5 - 8.1 g/dL   Albumin 4.0 3.5 - 5.0 g/dL   AST 27 15 - 41 U/L   ALT 32 0 - 44 U/L   Alkaline Phosphatase 62 38 - 126 U/L   Total Bilirubin 0.7 0.3 - 1.2 mg/dL   GFR, Estimated >60 >60 mL/min   Anion gap 10 5 - 15  Ethanol  Result  Value Ref Range   Alcohol, Ethyl (B) Q000111Q Q000111Q mg/dL  Salicylate level  Result Value Ref Range   Salicylate Lvl Q000111Q (L) 7.0 - 30.0 mg/dL  Acetaminophen level  Result Value Ref Range   Acetaminophen (Tylenol), Serum <10 (L) 10 - 30 ug/mL  cbc  Result Value Ref Range   WBC 14.2 (H) 4.0 - 10.5 K/uL   RBC 4.93 4.22 - 5.81 MIL/uL   Hemoglobin 14.5 13.0 - 17.0 g/dL   HCT 43.3 39.0 - 52.0 %   MCV 87.8 80.0 - 100.0 fL   MCH 29.4 26.0 - 34.0 pg   MCHC 33.5 30.0 - 36.0 g/dL   RDW 13.9 11.5 - 15.5 %   Platelets 350 150 - 400 K/uL   nRBC 0.0 0.0 - 0.2 %  Urine Drug Screen, Qualitative  Result Value Ref Range   Tricyclic, Ur Screen NONE DETECTED NONE DETECTED   Amphetamines, Ur Screen NONE DETECTED NONE DETECTED   MDMA (Ecstasy)Ur Screen NONE DETECTED NONE DETECTED   Cocaine Metabolite,Ur [place] POSITIVE (A) NONE DETECTED   Opiate, Ur Screen NONE DETECTED NONE DETECTED   Phencyclidine (PCP) Ur S NONE DETECTED NONE DETECTED   Cannabinoid 50 Ng, Ur [place] NONE DETECTED NONE DETECTED   Barbiturates, Ur Screen NONE DETECTED NONE DETECTED   Benzodiazepine, Ur Scrn NONE DETECTED NONE DETECTED   Methadone Scn, Ur NONE DETECTED NONE DETECTED      Assessment & Plan:   Problem List Items Addressed This Visit   None Visit Diagnoses     Carpal tunnel syndrome on left    -  Primary   Relevant Medications   naproxen (NAPROSYN) 500 MG tablet   Prostatitis, unspecified prostatitis type       Relevant Medications   levofloxacin (LEVAQUIN) 500 MG tablet   Urethritis       Relevant Medications   levofloxacin (LEVAQUIN) 500 MG tablet       Clinically most consistent with acute L hand carpal tunnel syndrome. History and exam not suggestive of other nerve impingement or complication - Suspected secondary to chronic repetitive overuse - Limited improvement conservative therapy  with   Plan: 1. Discussed course of carpal tunnel syndrome, treatment, prognosis, complications 2. START NSAID trial with  Naproxen 557m BID wc 1-2 weeks then PRN may extend up to 2-4 weeks, then PRN - Take Tylenol PRN breakthrough 3. START wrist splint support (avoid flex/ext repetition) wear overnight to bed, may use during day with excessive or repetitive activities  If not improving START Prednisone taper 668mdown to 1060mown by 10 each day - if improve and need extend can add 3-5 more days if need, review precautions - HOLD NSAID while on prednisone, may resume when finished  Follow-up within 2-4 weeks if not improved consider referral to Neuro for NCS vs Ortho  Recurrent urethral discharge vs prostatitis symptoms Possible Prostatitis inflammation or infection of prostate  Start taking Levaquin antibiotic 500m49mily x 14 days to cover Caution with high intensity activity  If not improving let me know 2 weeks we can refer to Urologist next. May have association with BMs / GU tract.  Meds ordered this encounter  Medications   naproxen (NAPROSYN) 500 MG tablet    Sig: Take 1 tablet (500 mg total) by mouth 2 (two) times daily with a meal. For 2-4 weeks then as needed    Dispense:  60 tablet    Refill:  0   levofloxacin (LEVAQUIN) 500 MG tablet    Sig: Take 1 tablet (500 mg total) by mouth daily. For 2 weeks. For prostatitis    Dispense:  14 tablet    Refill:  0      Follow up plan: Return in about 2 weeks (around 12/19/2022), or if symptoms worsen or fail to improve.    AlexNobie Putnam [facility] 12/05/2022, 3:16 PM

## 2022-12-05 NOTE — Patient Instructions (Addendum)
Thank you for coming to the office today.  Possible Prostatitis inflammation or infection of prostate  Start taking Levaquin antibiotic 543m daily x 14 days to cover Caution with high intensity activity  If not improving let me know 2 weeks we can refer to Urologist.  ---- You most likely have Carpal Tunnel Syndrome of Left hand/wrist based on your symptoms. This a problem of compression on the nerve entering the hand at the wrist. Often it is caused by long history of overuse or repetitive activities that put strain on the nerve within wrist. Occasionally this can be caused by swelling or weight gain and pressure on this nerve as well.  Recommend trial of Anti-inflammatory with Naproxen (Naprosyn) 5060mtabs - take one with food and plenty of water TWICE daily every day (breakfast and dinner), for next 2 to 4 weeks, then you may take only as needed - DO NOT TAKE any ibuprofen, aleve, motrin while you are taking this medicine - It is safe to take Tylenol Ext Str 50045mabs - take 1 to 2 (max dose 1000m81mvery 6 hours as needed for breakthrough pain, max 24 hour daily dose is 6 to 8 tablets or 4000mg40mck up plan - if not responding, we can go with stronger steroid course for anti inflammatory. Start with Prednisone taper 6 days from 60 down to 10mg 68m stop - While on Prednisone - HOLD Naproxen for 6 days until finished then may resume  Recommend to start taking Tylenol Extra Strength 500mg t31m- take 1 to 2 tabs per dose (max 1000mg) e36m 6-8 hours for pain (take regularly, don't skip a dose for next 7 days), max 24 hour daily dose is 6 tablets or 3000mg. In22m future you can repeat the same everyday Tylenol course for 1-2 weeks at a time.   IF not improving 4 weeks let me know or come back  We can refer to Neurologist or Orthopedic based on how you are doing at that time. Consider injection or future surgery if needed.     Please schedule a Follow-up Appointment to: Return in  about 2 weeks (around 12/19/2022), or if symptoms worsen or fail to improve.  If you have any other questions or concerns, please feel free to call the office or send a message through MyChart. [place] also schedule an earlier appointment if necessary.  Additionally, you may be receiving a survey about your experience at our office within a few days to 1 week by e-mail or mail. We value your feedback.  Adam Cannon

## 2022-12-15 ENCOUNTER — Ambulatory Visit
Admission: RE | Admit: 2022-12-15 | Discharge: 2022-12-15 | Disposition: A | Discharge status: Home / Self Care | Payer: Medicaid Other | Attending: Family Medicine | Admitting: Family Medicine

## 2022-12-15 ENCOUNTER — Encounter: Payer: Self-pay | Admitting: Family Medicine

## 2022-12-15 ENCOUNTER — Ambulatory Visit
Admission: RE | Admit: 2022-12-15 | Discharge: 2022-12-15 | Disposition: A | Discharge status: Home / Self Care | Payer: Medicaid Other | Source: Ambulatory Visit | Attending: Family Medicine | Admitting: Family Medicine

## 2022-12-15 ENCOUNTER — Ambulatory Visit: Payer: Medicaid Other | Admitting: Family Medicine

## 2022-12-15 VITALS — BP 128/72 | HR 76 | Ht 70.0 in | Wt 179.8 lb

## 2022-12-15 DIAGNOSIS — M542 Cervicalgia: Secondary | ICD-10-CM

## 2022-12-15 DIAGNOSIS — G5602 Carpal tunnel syndrome, left upper limb: Secondary | ICD-10-CM

## 2022-12-15 DIAGNOSIS — M7552 Bursitis of left shoulder: Secondary | ICD-10-CM

## 2022-12-15 DIAGNOSIS — G589 Mononeuropathy, unspecified: Secondary | ICD-10-CM | POA: Insufficient documentation

## 2022-12-15 DIAGNOSIS — R202 Paresthesia of skin: Secondary | ICD-10-CM

## 2022-12-15 MED ORDER — PREDNISONE 20 MG PO TABS: ORAL_TABLET | ORAL | 0 refills | Status: DC

## 2022-12-15 MED ORDER — BACLOFEN 10 MG PO TABS: 5.0000 mg | ORAL_TABLET | Freq: Three times a day (TID) | ORAL | 1 refills | Status: DC | PRN

## 2022-12-15 NOTE — Progress Notes (Signed)
Subjective:    Patient ID: Adam Cannon, male    DOB: 11/11/1965, 57 y.o.   MRN: [id number]  Adam Cannon is a 57 y.o. male presenting on 12/15/2022 for Arm Pain   HPI  Left sided Shoulder / Neck Pain He believes something triggered with his L neck recently. No heavy lifting he popped neck and had some pain provoked but not immediately Recently evaluated thought to have L carpal Tunnel now he reports symptoms in Neck and Shoulder Describes constant pain 8 out of 10 Last visit seen 12/05/22 10 days ago same issue, at that time only reported wrist symptoms, given naproxen, took this initially but then when determined not only carpal tunnel he stopped naproxen, it was helpful.  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      01/17/2022    8:58 AM 12/20/2021    1:38 PM 12/15/2019    1:17 PM  Depression screen PHQ 2/9  Decreased Interest 0 0 0  Down, Depressed, Hopeless 0 0 0  PHQ - 2 Score 0 0 0  Altered sleeping 0 0   Tired, decreased energy 0 0   Change in appetite 0 0   Feeling bad or failure about yourself  0 0   Trouble concentrating 0 0   Moving slowly or fidgety/restless 0 0   Suicidal thoughts 0 0   PHQ-9 Score 0 0   Difficult doing work/chores Not difficult at all Not difficult at all     Social History   Tobacco Use   Smoking status: Never   Smokeless tobacco: Never  Vaping Use   Vaping Use: Never used  Substance Use Topics   Alcohol use: No   Drug use: Never    Review of Systems Per HPI unless specifically indicated above     Objective:    BP 128/72   Pulse 76   Ht '5\' 10"'$  (1.778 m)   Wt 179 lb 12.8 oz (81.6 kg)   SpO2 100%    BMI 25.80 kg/m   Wt Readings from Last 3 Encounters:  12/15/22 179 lb 12.8 oz (81.6 kg)  12/05/22 177 lb 3.2 oz (80.4 kg)  11/05/22 180 lb (81.6 kg)    Physical Exam Vitals and nursing note reviewed.  Constitutional:      General: He is not in acute distress.    Appearance: Normal appearance. He is well-developed. He is not diaphoretic.     Comments: Well-appearing, comfortable, cooperative  HENT:     Head: Normocephalic and atraumatic.  Eyes:     General:        Right eye: No discharge.        Left eye: No discharge.     Conjunctiva/sclera: Conjunctivae normal.  Neck:     Comments: Left sided neck muscles hypertonicity spasm with tenderness, into trapezius distribution. No bony tenderness. Reduced L neck side bending rotation. Cardiovascular:     Rate and Rhythm: Normal rate.  Pulmonary:     Effort: Pulmonary effort is normal.  Musculoskeletal:     Cervical back: Tenderness present.     Comments: Left back with trapezius muscle spasm and hypertonicity  LEFT Shoulder Inspection: Normal appearance bilateral symmetrical Palpation: Some mild tender left posterior shoulder trapezius distribution ROM: Reduced Active ROM forward flex / abduction Special Testing: Rotator cuff testing negative for weakness with supraspinatus full can and empty can test but some provoked pain, also positive impingement testing. Strength: Normal strength 5/5 flex/ext, ext rot / int rot, grip, rotator cuff str testing. Neurovascular: Distally intact pulses, sensation to light touch   Skin:    General: Skin is warm and dry.     Findings: No erythema or rash.  Neurological:     Mental Status: He is alert and oriented to person, place, and time.  Psychiatric:        Mood and Affect: Mood normal.        Behavior: Behavior normal.        Thought Content: Thought content normal.     Comments: Well groomed, good eye contact, normal speech and thoughts     I have personally reviewed the radiology  report from 12/15/22 on CT Neck Soft Tissue.  Narrative & Impression  CLINICAL DATA:  Right ear pain over the last 3 weeks. Referred otalgia.   EXAM: CT NECK WITH CONTRAST   TECHNIQUE: Multidetector CT imaging of the neck was performed using the standard protocol following the bolus administration of intravenous contrast.   CONTRAST:  61m OMNIPAQUE IOHEXOL 300 MG/ML  SOLN   COMPARISON:  10/25/2018, done for same indication.   FINDINGS: Pharynx and larynx: No mucosal or submucosal lesion.   Salivary glands: Parotid and submandibular glands are normal.   Thyroid: Normal   Lymph nodes: No enlarged or low-density nodes on either side of the neck.   Vascular: Normal   Limited intracranial: Normal   Visualized orbits: Normal   Mastoids and visualized paranasal sinuses: Clear. No fluid in the middle ears.   Skeleton: Ordinary mild cervical spondylosis. No temporal bone lesion seen on either side. Normal appearing styloid processes.   Upper chest: Chronic peripheral pulmonary fibrosis pattern, similar to the study of 2020. No active process. Mild mediastinal nodal prominence not felt be of acute relevance, stable since the previous study.   Other: None   IMPRESSION: No abnormality seen to explain otalgia on the right. No evidence of inflammatory or neoplastic disease. No sign of abnormal styloid process.     Electronically Signed   By: MNelson ChimesM.D.   On: 01/06/2020 10:52    Results for orders placed or performed during the hospital encounter of 11/05/22  Resp panel by RT-PCR (RSV, Flu A&B, Covid) Anterior Nasal Swab   Specimen: Anterior Nasal Swab  Result Value Ref Range   SARS Coronavirus 2 by RT PCR NEGATIVE NEGATIVE   Influenza A by PCR NEGATIVE NEGATIVE   Influenza B by PCR NEGATIVE NEGATIVE   Resp Syncytial Virus by PCR NEGATIVE NEGATIVE  Comprehensive metabolic panel  Result Value Ref Range   Sodium 138 135 - 145 mmol/L   Potassium 3.4 (L) 3.5 -  5.1 mmol/L   Chloride 104 98 - 111 mmol/L   CO2 24 22 - 32 mmol/L   Glucose, Bld 110 (H) 70 - 99 mg/dL   BUN 12 6 - 20 mg/dL   Creatinine, Ser 1.14 0.61 - 1.24 mg/dL   Calcium 9.1 8.9 - 10.3 mg/dL   Total Protein 7.4 6.5 - 8.1 g/dL   Albumin 4.0 3.5 - 5.0 g/dL   AST 27 15 - 41 U/L   ALT 32 0 - 44 U/L   Alkaline  Phosphatase 62 38 - 126 U/L   Total Bilirubin 0.7 0.3 - 1.2 mg/dL   GFR, Estimated >60 >60 mL/min   Anion gap 10 5 - 15  Ethanol  Result Value Ref Range   Alcohol, Ethyl (B) Q000111Q Q000111Q mg/dL  Salicylate level  Result Value Ref Range   Salicylate Lvl Q000111Q (L) 7.0 - 30.0 mg/dL  Acetaminophen level  Result Value Ref Range   Acetaminophen (Tylenol), Serum <10 (L) 10 - 30 ug/mL  cbc  Result Value Ref Range   WBC 14.2 (H) 4.0 - 10.5 K/uL   RBC 4.93 4.22 - 5.81 MIL/uL   Hemoglobin 14.5 13.0 - 17.0 g/dL   HCT 43.3 39.0 - 52.0 %   MCV 87.8 80.0 - 100.0 fL   MCH 29.4 26.0 - 34.0 pg   MCHC 33.5 30.0 - 36.0 g/dL   RDW 13.9 11.5 - 15.5 %   Platelets 350 150 - 400 K/uL   nRBC 0.0 0.0 - 0.2 %  Urine Drug Screen, Qualitative  Result Value Ref Range   Tricyclic, Ur Screen NONE DETECTED NONE DETECTED   Amphetamines, Ur Screen NONE DETECTED NONE DETECTED   MDMA (Ecstasy)Ur Screen NONE DETECTED NONE DETECTED   Cocaine Metabolite,Ur [place] POSITIVE (A) NONE DETECTED   Opiate, Ur Screen NONE DETECTED NONE DETECTED   Phencyclidine (PCP) Ur S NONE DETECTED NONE DETECTED   Cannabinoid 50 Ng, Ur [place] NONE DETECTED NONE DETECTED   Barbiturates, Ur Screen NONE DETECTED NONE DETECTED   Benzodiazepine, Ur Scrn NONE DETECTED NONE DETECTED   Methadone Scn, Ur NONE DETECTED NONE DETECTED      Assessment & Plan:   Problem List Items Addressed This Visit   None Visit Diagnoses     Neck pain on left side    -  Primary   Relevant Medications   predniSONE (DELTASONE) 20 MG tablet   baclofen (LIORESAL) 10 MG tablet   Other Relevant Orders   DG Cervical Spine Complete   Ambulatory referral to  Neurology   Pinched nerve in neck       Relevant Medications   predniSONE (DELTASONE) 20 MG tablet   Other Relevant Orders   DG Cervical Spine Complete   Ambulatory referral to Neurology   Bursitis of left shoulder       Relevant Medications   predniSONE (DELTASONE) 20 MG tablet   baclofen (LIORESAL) 10 MG tablet   Median nerve compression at multiple levels, left       Relevant Medications   baclofen (LIORESAL) 10 MG tablet   Other Relevant Orders   Ambulatory referral to Neurology   Paresthesia of left arm       Relevant Orders   Ambulatory referral to Neurology       Neck X-ray today  Likely muscle strain / sprain from neck shoulder region prinching nerve down into arm  Likely bursitis of Left shoulder with these symptoms.  Start Prednisone taper for 7 days While on steroid, HOLD Naproxen Advil ibuprofen aleve  Resume Naproxen 500 twice a day for 1-2 weeks after the steroid  Okay to take Tylenol as well if you need it.  Start taking Baclofen (Lioresal) '10mg'$  (muscle relaxant) - start with half (cut) to one whole pill at night as needed for next 1-3 nights (may make you drowsy, caution with driving) see how it affects you, then if tolerated increase to one pill 2 to 3 times a day or (every 8 hours as needed)  Based on X-ray and results,  we can refer to Neurologist  He will likely benefit from Nerve conduction study and further evaluation. Uncertain if nerve impingement is neck / c-spine or L shoulder. Could also have bursitis, may not be directly related to L paresthesia. Seems similar repetitive use etiology.  [facility] - Neurology Dept [place], [place] [address] Phone: [phone]  Stay tuned for apt.  If not improving we can consider adding nerve pain medication - Gabapentin.  Orders Placed This Encounter  Procedures   DG Cervical Spine Complete    Standing Status:   Future    Number of Occurrences:   1    Standing Expiration Date:    12/16/2023    Order Specific Question:   Reason for Exam (SYMPTOM  OR DIAGNOSIS REQUIRED)    Answer:   left sided neck pain and nerve impingement, paresthesia into L hand    Order Specific Question:   Preferred imaging location?    Answer:   [facility]   Ambulatory referral to Neurology    Referral Priority:   Routine    Referral Type:   Consultation    Referral Reason:   Specialty Services Required    Requested Specialty:   Neurology    Number of Visits Requested:   1     Meds ordered this encounter  Medications   predniSONE (DELTASONE) 20 MG tablet    Sig: Take daily with food. Start with '60mg'$  (3 pills) x 2 days, then reduce to '40mg'$  (2 pills) x 2 days, then '20mg'$  (1 pill) x 3 days    Dispense:  13 tablet    Refill:  0   baclofen (LIORESAL) 10 MG tablet    Sig: Take 0.5-1 tablets (5-10 mg total) by mouth 3 (three) times daily as needed for muscle spasms.    Dispense:  30 each    Refill:  1      Follow up plan: Return if symptoms worsen or fail to improve.   Nobie Putnam, DO [facility] 12/15/2022, 2:07 PM

## 2022-12-15 NOTE — Patient Instructions (Addendum)
Thank you for coming to the office today.  Neck X-ray today  Likely muscle strain / sprain from neck shoulder region prinching nerve down into arm  Likely bursitis of Left shoulder with these symptoms.  Start Prednisone taper for 7 days While on steroid, HOLD Naproxen Advil ibuprofen aleve  Resume Naproxen 500 twice a day for 1-2 weeks after the steroid  Okay to take Tylenol as well if you need it.  Start taking Baclofen (Lioresal) '10mg'$  (muscle relaxant) - start with half (cut) to one whole pill at night as needed for next 1-3 nights (may make you drowsy, caution with driving) see how it affects you, then if tolerated increase to one pill 2 to 3 times a day or (every 8 hours as needed)  Based on X-ray and results, we can refer to Neurologist  [facility] - Neurology Dept [place], [place] [address] Phone: [phone]  Stay tuned for apt.  If not improving we can consider adding nerve pain medication - Gabapentin.  IF ordered - here is info on the medication  Start Gabapentin '100mg'$  capsules, take at night for 2-3 nights only, and then increase to 2 times a day for a few days, and then may increase to 3 times a day, it may make you drowsy, if helps significantly at night only, then you can increase instead to 3 capsules at night, instead of 3 times a day - In the future if needed, we can significantly increase the dose if tolerated well, some common doses are '300mg'$  three times a day up to '600mg'$  three times a day, usually it takes several weeks or months to get to higher doses   Please schedule a Follow-up Appointment to: Return if symptoms worsen or fail to improve.  If you have any other questions or concerns, please feel free to call the office or send a message through [place]. You may also schedule an earlier appointment if necessary.  Additionally, you may be receiving a survey about your experience at our office within a few days to 1 week by e-mail or  mail. We value your feedback.  Nobie Putnam, DO [place]

## 2022-12-22 ENCOUNTER — Telehealth: Payer: Self-pay | Admitting: Family Medicine

## 2022-12-22 NOTE — Telephone Encounter (Signed)
Pt. States [facility] has not received the referral yet. He requests the referral be sent. Please advise pt.

## 2022-12-30 ENCOUNTER — Ambulatory Visit: Payer: Medicaid Other | Admitting: Family Medicine

## 2022-12-30 ENCOUNTER — Other Ambulatory Visit: Payer: Self-pay | Admitting: Family Medicine

## 2022-12-30 DIAGNOSIS — G5602 Carpal tunnel syndrome, left upper limb: Secondary | ICD-10-CM

## 2022-12-30 NOTE — Telephone Encounter (Signed)
Requested Prescriptions  Pending Prescriptions Disp Refills   naproxen (NAPROSYN) 500 MG tablet [Pharmacy Med Name: Naproxen 500 MG Oral Tablet] 60 tablet 0    Sig: TAKE 1 TABLET BY MOUTH TWICE DAILY WITH MEALS FOR  2  TO  4  WEEKS,  THEN  AS  NEEDED     Analgesics:  NSAIDS Failed - 12/30/2022  9:48 AM      Failed - Manual Review: Labs are only required if the patient has taken medication for more than 8 weeks.      Passed - Cr in normal range and within 360 days    Creat  Date Value Ref Range Status  01/17/2022 1.20 0.70 - 1.30 mg/dL Final   Creatinine, Ser  Date Value Ref Range Status  11/05/2022 1.14 0.61 - 1.24 mg/dL Final         Passed - HGB in normal range and within 360 days    Hemoglobin  Date Value Ref Range Status  11/05/2022 14.5 13.0 - 17.0 g/dL Final   HGB  Date Value Ref Range Status  02/19/2012 15.3 13.0 - 18.0 g/dL Final         Passed - PLT in normal range and within 360 days    Platelets  Date Value Ref Range Status  11/05/2022 350 150 - 400 K/uL Final   Platelet  Date Value Ref Range Status  02/19/2012 232 150 - 440 x10 3/mm 3 Final         Passed - HCT in normal range and within 360 days    HCT  Date Value Ref Range Status  11/05/2022 43.3 39.0 - 52.0 % Final  02/19/2012 45.5 40.0 - 52.0 % Final         Passed - eGFR is 30 or above and within 360 days    EGFR (African American)  Date Value Ref Range Status  02/19/2012 >60  Final   GFR calc Af Amer  Date Value Ref Range Status  12/22/2018 >60 >60 mL/min Final   EGFR (Non-African Amer.)  Date Value Ref Range Status  02/19/2012 >60  Final    Comment:    eGFR values <55m/min/1.73 m2 may be an indication of chronic kidney disease (CKD). Calculated eGFR is useful in patients with stable renal function. The eGFR calculation will not be reliable in acutely ill patients when serum creatinine is changing rapidly. It is not useful in  patients on dialysis. The eGFR calculation may not be  applicable to patients at the low and high extremes of body sizes, pregnant women, and vegetarians.    GFR, Estimated  Date Value Ref Range Status  11/05/2022 >60 >60 mL/min Final    Comment:    (NOTE) Calculated using the CKD-EPI Creatinine Equation (2021)    eGFR  Date Value Ref Range Status  01/17/2022 72 > OR = 60 mL/min/1.73mFinal    Comment:    The eGFR is based on the CKD-EPI 2021 equation. To calculate  the new eGFR from a previous Creatinine or Cystatin C result, go to https://www.kidney.org/professionals/ kdoqi/gfr%5Fcalculator          Passed - Patient is not pregnant      Passed - Valid encounter within last 12 months    Recent Outpatient Visits           2 weeks ago Neck pain on left side   [place]   3 weeks ago Carpal tunnel syndrome on  left   [place], DO   7 months ago [facility] Olin Hauser, DO   7 months ago [facility] Olin Hauser, DO   10 months ago Lower urinary tract symptoms (LUTS)   [place], DO       Future Appointments             [facility], [facility], [facility]

## 2022-12-31 ENCOUNTER — Other Ambulatory Visit: Payer: Self-pay | Admitting: Student

## 2022-12-31 ENCOUNTER — Ambulatory Visit: Payer: Medicaid Other | Admitting: Family Medicine

## 2022-12-31 DIAGNOSIS — M79602 Pain in left arm: Secondary | ICD-10-CM

## 2022-12-31 DIAGNOSIS — M542 Cervicalgia: Secondary | ICD-10-CM

## 2022-12-31 DIAGNOSIS — R200 Anesthesia of skin: Secondary | ICD-10-CM

## 2023-01-01 ENCOUNTER — Encounter: Payer: Self-pay | Admitting: Family Medicine

## 2023-01-01 ENCOUNTER — Ambulatory Visit: Payer: Medicaid Other | Admitting: Family Medicine

## 2023-01-01 VITALS — BP 132/80 | HR 75 | Ht 70.0 in | Wt 179.2 lb

## 2023-01-01 DIAGNOSIS — J329 Chronic sinusitis, unspecified: Secondary | ICD-10-CM

## 2023-01-01 DIAGNOSIS — R369 Urethral discharge, unspecified: Secondary | ICD-10-CM | POA: Diagnosis not present

## 2023-01-01 MED ORDER — LORATADINE 10 MG PO TABS: 10.0000 mg | ORAL_TABLET | Freq: Every day | ORAL | 11 refills | Status: DC

## 2023-01-01 MED ORDER — LORATADINE 10 MG PO TABS: 10.0000 mg | ORAL_TABLET | Freq: Every day | ORAL | 3 refills | Status: AC

## 2023-01-01 MED ORDER — IPRATROPIUM BROMIDE 0.06 % NA SOLN: 2.0000 | Freq: Four times a day (QID) | NASAL | 3 refills | Status: DC | PRN

## 2023-01-01 NOTE — Progress Notes (Signed)
Subjective:    Patient ID: Adam Cannon, male    DOB: 1966-09-30, 57 y.o.   MRN: [id number]  Adam Cannon is a 57 y.o. male presenting on 01/01/2023 for Nasal Congestion and discharge from rectum   HPI  Chronic Rhinosinusitis Tried Sudafed OTC. Temporary relief Not using nasal sprays Seems to have symptoms all the time. Tried Claritin for 1 week approximately not regularly  Urethritis Discharge Prior issue in 2023 had negative STD testing. Resolved w/ treatment empirically. No recent sexual contact since then   He notes that previously he experienced urethral discharge more severe and fairly constant it was worse after BM. 1 week ago he had one more similar episode of urethral discharge after BM with straining.   Not having dysuria, frequency      01/17/2022    8:58 AM 12/20/2021    1:38 PM 12/15/2019    1:17 PM  Depression screen PHQ 2/9  Decreased Interest 0 0 0  Down, Depressed, Hopeless 0 0 0  PHQ - 2 Score 0 0 0  Altered sleeping 0 0   Tired, decreased energy 0 0   Change in appetite 0 0   Feeling bad or failure about yourself  0 0   Trouble concentrating 0 0   Moving slowly or fidgety/restless 0 0   Suicidal thoughts 0 0   PHQ-9 Score 0 0   Difficult doing work/chores Not difficult at all Not difficult at all     Social History   Tobacco Use   Smoking status: Never   Smokeless tobacco: Never  Vaping Use   Vaping Use: Never used  Substance Use Topics   Alcohol use: No   Drug use: Never    Review of Systems Per HPI unless specifically indicated above     Objective:    BP 132/80   Pulse 75   Ht '5\' 10"'$  (1.778 m)   Wt 179 lb 3.2 oz (81.3 kg)   SpO2 99%   BMI 25.71 kg/m   Wt Readings from Last 3 Encounters:  01/01/23 179 lb 3.2 oz (81.3 kg)  12/15/22 179 lb 12.8 oz (81.6 kg)  12/05/22 177 lb 3.2 oz (80.4 kg)    Physical Exam Vitals and nursing note reviewed.  Constitutional:      General: He is not in acute distress.    Appearance: Normal  appearance. He is well-developed. He is not diaphoretic.     Comments: Well-appearing, comfortable, cooperative  HENT:     Head: Normocephalic and atraumatic.  Eyes:     General:        Right eye: No discharge.        Left eye: No discharge.     Conjunctiva/sclera: Conjunctivae normal.  Cardiovascular:     Rate and Rhythm: Normal rate.  Pulmonary:     Effort: Pulmonary effort is normal.  Skin:    General: Skin is warm and dry.     Findings: No erythema or rash.  Neurological:     Mental Status: He is alert and oriented to person, place, and time.  Psychiatric:        Mood and Affect: Mood normal.        Behavior: Behavior normal.        Thought Content: Thought content normal.     Comments: Well groomed, good eye contact, normal speech and thoughts    Results for orders placed or performed during the hospital encounter of 11/05/22  Resp panel by RT-PCR (  RSV, Flu A&B, Covid) Anterior Nasal Swab   Specimen: Anterior Nasal Swab  Result Value Ref Range   SARS Coronavirus 2 by RT PCR NEGATIVE NEGATIVE   Influenza A by PCR NEGATIVE NEGATIVE   Influenza B by PCR NEGATIVE NEGATIVE   Resp Syncytial Virus by PCR NEGATIVE NEGATIVE  Comprehensive metabolic panel  Result Value Ref Range   Sodium 138 135 - 145 mmol/L   Potassium 3.4 (L) 3.5 - 5.1 mmol/L   Chloride 104 98 - 111 mmol/L   CO2 24 22 - 32 mmol/L   Glucose, Bld 110 (H) 70 - 99 mg/dL   BUN 12 6 - 20 mg/dL   Creatinine, Ser 1.14 0.61 - 1.24 mg/dL   Calcium 9.1 8.9 - 10.3 mg/dL   Total Protein 7.4 6.5 - 8.1 g/dL   Albumin 4.0 3.5 - 5.0 g/dL   AST 27 15 - 41 U/L   ALT 32 0 - 44 U/L   Alkaline Phosphatase 62 38 - 126 U/L   Total Bilirubin 0.7 0.3 - 1.2 mg/dL   GFR, Estimated >60 >60 mL/min   Anion gap 10 5 - 15  Ethanol  Result Value Ref Range   Alcohol, Ethyl (B) Q000111Q Q000111Q mg/dL  Salicylate level  Result Value Ref Range   Salicylate Lvl Q000111Q (L) 7.0 - 30.0 mg/dL  Acetaminophen level  Result Value Ref Range    Acetaminophen (Tylenol), Serum <10 (L) 10 - 30 ug/mL  cbc  Result Value Ref Range   WBC 14.2 (H) 4.0 - 10.5 K/uL   RBC 4.93 4.22 - 5.81 MIL/uL   Hemoglobin 14.5 13.0 - 17.0 g/dL   HCT 43.3 39.0 - 52.0 %   MCV 87.8 80.0 - 100.0 fL   MCH 29.4 26.0 - 34.0 pg   MCHC 33.5 30.0 - 36.0 g/dL   RDW 13.9 11.5 - 15.5 %   Platelets 350 150 - 400 K/uL   nRBC 0.0 0.0 - 0.2 %  Urine Drug Screen, Qualitative  Result Value Ref Range   Tricyclic, Ur Screen NONE DETECTED NONE DETECTED   Amphetamines, Ur Screen NONE DETECTED NONE DETECTED   MDMA (Ecstasy)Ur Screen NONE DETECTED NONE DETECTED   Cocaine Metabolite,Ur [place] POSITIVE (A) NONE DETECTED   Opiate, Ur Screen NONE DETECTED NONE DETECTED   Phencyclidine (PCP) Ur S NONE DETECTED NONE DETECTED   Cannabinoid 50 Ng, Ur [place] NONE DETECTED NONE DETECTED   Barbiturates, Ur Screen NONE DETECTED NONE DETECTED   Benzodiazepine, Ur Scrn NONE DETECTED NONE DETECTED   Methadone Scn, Ur NONE DETECTED NONE DETECTED      Assessment & Plan:   Problem List Items Addressed This Visit   None Visit Diagnoses     Urethral discharge in male    -  Primary   Relevant Orders   Ambulatory referral to Urology   Chronic rhinosinusitis       Relevant Medications   ipratropium (ATROVENT) 0.06 % nasal spray   loratadine (CLARITIN) 10 MG tablet      I believe it is a physiologic prostatic discharge that can be normal. They will discuss further.   referral to Urologist for further evaluation of painless urethral clear discharge usually after bowel movement or other times, he has been evaluated and tested for STDs and treated for prostate with antibiotics, he would like 2nd opinion now and further evaluation    Likely chronic rhinosinusitis allergic trigger  Try to prevent it with claritin  Start Loratadine or Claritin '10mg'$  daily at least  30 day trial or more.  Spot treatment if draining or dripping  Start Atrovent nasal spray decongestant 2 sprays in each  nostril up to 4 times daily AS NEEDED   Meds ordered this encounter  Medications   ipratropium (ATROVENT) 0.06 % nasal spray    Sig: Place 2 sprays into both nostrils 4 (four) times daily as needed for rhinitis.    Dispense:  15 mL    Refill:  3   DISCONTD: loratadine (CLARITIN) 10 MG tablet    Sig: Take 1 tablet (10 mg total) by mouth daily. Use for 4-6 weeks then stop, and use as needed or seasonally    Dispense:  30 tablet    Refill:  11   loratadine (CLARITIN) 10 MG tablet    Sig: Take 1 tablet (10 mg total) by mouth daily.    Dispense:  90 tablet    Refill:  3     Follow up plan: Return if symptoms worsen or fail to improve.   Nobie Putnam, [facility] 01/01/2023, 10:37 AM

## 2023-01-01 NOTE — Patient Instructions (Addendum)
Thank you for coming to the office today.  Likely chronic rhinosinusitis allergic trigger  Try to prevent it with claritin  Start Loratadine or Claritin '10mg'$  daily at least 30 day trial or more.  Spot treatment if draining or dripping  Start Atrovent nasal spray decongestant 2 sprays in each nostril up to 4 times daily AS NEEDED   Referral to Urologist- they will call you.  I believe it is a physiologic prostatic discharge that can be normal. They will discuss further.  [facility] -[address] floor [place],  [place]  [address] Phone: [phone]     Please schedule a Follow-up Appointment to: Return if symptoms worsen or fail to improve.  If you have any other questions or concerns, please feel free to call the office or send a message through [place]. You may also schedule an earlier appointment if necessary.  Additionally, you may be receiving a survey about your experience at our office within a few days to 1 week by e-mail or mail. We value your feedback.  Nobie Putnam, DO [place]

## 2023-01-28 ENCOUNTER — Ambulatory Visit (INDEPENDENT_AMBULATORY_CARE_PROVIDER_SITE_OTHER): Payer: Medicaid Other | Admitting: Urology

## 2023-01-28 ENCOUNTER — Encounter: Payer: Self-pay | Admitting: Urology

## 2023-01-28 VITALS — BP 117/76 | HR 98 | Ht 70.0 in | Wt 180.0 lb

## 2023-01-28 DIAGNOSIS — R369 Urethral discharge, unspecified: Secondary | ICD-10-CM

## 2023-01-28 LAB — MICROSCOPIC EXAMINATION

## 2023-01-28 LAB — URINALYSIS, COMPLETE
Bilirubin, UA: NEGATIVE
Glucose, UA: NEGATIVE
Ketones, UA: NEGATIVE
Leukocytes,UA: NEGATIVE
Nitrite, UA: NEGATIVE
Protein,UA: NEGATIVE
RBC, UA: NEGATIVE
Specific Gravity, UA: 1.02 (ref 1.005–1.030)
Urobilinogen, Ur: 0.2 mg/dL (ref 0.2–1.0)
pH, UA: 7.5 (ref 5.0–7.5)

## 2023-01-28 MED ORDER — DOXYCYCLINE HYCLATE 100 MG PO CAPS: 100.0000 mg | ORAL_CAPSULE | Freq: Two times a day (BID) | ORAL | 0 refills | Status: DC

## 2023-01-28 NOTE — Patient Instructions (Signed)
Urethritis, Adult  Urethritis is a swelling (inflammation) of the urethra. The urethra is the tube that drains urine from the bladder. It is important to get treatment for this condition early. Delayed treatment may lead to complications, such as an infection in the urinary tract (ureters, kidneys, and bladder). What are the causes? This condition may be caused by: Germs that are spread through sexual contact. This is the leading cause of urethritis. This may include bacterial or viral infections. Injury to the urethra. This can happen after a thin, flexible tube (catheter) is inserted into the urethra to drain urine, or after medical instruments or foreign bodies are inserted into the area. Chemical irritation. This may include contact with spermicide or prolonged contact with chemicals in bubble bath, shampoo, or perfumed soaps. A disease that causes inflammation. This is rare. What increases the risk? The following factors may make you more likely to develop this condition: Having sex without using a condom. Having multiple sexual partners. Having poor hygiene. What are the signs or symptoms? Symptoms of this condition include: Pain with urination. Frequent urination. An urgent need to urinate. Itching and pain in the vagina or penis. Discharge or bleeding coming from the penis. Most women have no symptoms. How is this diagnosed? This condition may be diagnosed based on: Your symptoms. Your medical history. A physical exam. Tests may also be done. These may include: Urine tests. Swabs from the urethra. How is this treated? Treatment for this condition depends on the cause. Urethritis caused by a bacterial infection is treated with antibiotic medicine. Sexual partners must also be treated. Follow these instructions at home: Medicines Take over-the-counter and prescription medicines only as told by your health care provider. If you were prescribed an antibiotic, take it as told  by your health care provider. Do not stop taking the antibiotic even if you start to feel better. Lifestyle Avoid using perfumed soaps, bubble bath, and shampoo when you bathe or shower. Rinse the vaginal area after bathing. Wear cotton underwear. Not wearing underwear when going to sleep can help. Make sure to wipe from front to back after using the toilet if you are male. Do not have sex until your health care provider approves. When you do have sex, be sure to practice safe sex. Any sexual partners you have had in the past 60 days should be treated. General instructions Drink enough fluid to keep your urine pale yellow. It is up to you to get your test results. Ask your health care provider, or the department that is doing the test, when your results will be ready. Keep all follow-up visits. This is important. Get tested again 3 months after treatment to make sure the infection is gone. It is important that your sexual partner also gets tested again. Contact a health care provider if: Your symptoms have not improved after 3 days. Your symptoms get worse. You have eye redness or pain. You develop abdominal pain or pelvic pain (in females). You develop joint pain or a rash. You have a fever or chills. Get help right away if: You have severe pain in the belly, back, or side. You vomit repeatedly. Summary Urethritis is a swelling (inflammation) of the urethra. Germs that are spread through sexual contact are the most common cause of this condition. It is important to get treatment for this condition early. Delayed treatment may lead to complications. Treatment for this condition depends on the cause. Any sexual partners must also be treated. This information is not   intended to replace advice given to you by your health care provider. Make sure you discuss any questions you have with your health care provider. Document Revised: 05/13/2020 Document Reviewed: 05/13/2020 Elsevier Patient  Education  2023 [employer]vier Inc[employer]  

## 2023-01-28 NOTE — Progress Notes (Signed)
   01/28/23 3:18 PM   Adam Cannon [birth year] [id number]  CC: Urethral discharge, urinary frequency  HPI: 57 year old male with history of schizophrenia as well as cocaine abuse who is referred for urethral discharge.  This has been going on for at least 6 months, STD testing with PCP has been negative, and he has been treated with ceftriaxone and doxycycline and azithromycin.  From reviewing the outside notes it sounds like he did have some improvement with the doxycycline initially.  He denies any dysuria or gross hematuria.  Mild to moderate urinary frequency during the day.  Clear urethral discharge most prominent after bowel movements.  PSA was checked and was normal at 0.44.  Urinalysis today 0-5 WBC, 0-2 RBC, moderate bacteria, no yeast, negative trichomonas, nitrite negative, no leukocytes.  PMH: Past Medical History:  Diagnosis Date   Environmental and seasonal allergies     Surgical History: Past Surgical History:  Procedure Laterality Date   NO PAST SURGERIES       Family History: Family History  Problem Relation Age of Onset   Hypertension Mother    Breast cancer Mother    Hypertension Father    Healthy Sister    Healthy Brother    Hypertension Maternal Uncle    Heart attack Maternal Uncle    Prostate cancer Neg Hx     Social History:  reports that he has never smoked. He has never been exposed to tobacco smoke. He has never used smokeless tobacco. He reports that he does not drink alcohol and does not use drugs.  Physical Exam: BP 117/76   Pulse 98   Ht 5\' 10"  (1.778 m)   Wt 180 lb (81.6 kg)   BMI 25.83 kg/m    Constitutional:  Alert and oriented, No acute distress. Cardiovascular: No clubbing, cyanosis, or edema. Respiratory: Normal respiratory effort, no increased work of breathing. GI: Abdomen is soft, nontender, nondistended, no abdominal masses GU: Circumcised phallus with patent meatus, no lesions, testicles 20 cc and descended  bilaterally  Assessment & Plan:   57 year old male with at least 6 months of painless clear urethral discharge, worse after bowel movements.  We reviewed possible etiologies including urethritis, prostatitis, or benign physiologic discharge, and reassurance was provided.  I think it is reasonable to try a 2-week course of doxycycline after he had some improvement with just a 1 week course of doxycycline previously.  No microscopic hematuria or urinary symptoms that would warrant cystoscopy or further evaluation.  Trial of doxycycline, follow-up with urology as needed  Legrand Rams, MD 01/28/2023  [facility] [address] [place], [place] [address] [phone]

## 2023-02-02 LAB — CULTURE, URINE COMPREHENSIVE

## 2023-03-23 ENCOUNTER — Ambulatory Visit: Payer: Medicaid Other | Admitting: Family Medicine

## 2023-03-26 ENCOUNTER — Ambulatory Visit
Admission: RE | Admit: 2023-03-26 | Discharge: 2023-03-26 | Disposition: A | Discharge status: Home / Self Care | Payer: Medicaid Other | Source: Ambulatory Visit | Attending: Family Medicine | Admitting: Family Medicine

## 2023-03-26 ENCOUNTER — Ambulatory Visit: Payer: Medicaid Other | Admitting: Family Medicine

## 2023-03-26 ENCOUNTER — Encounter: Payer: Self-pay | Admitting: Family Medicine

## 2023-03-26 ENCOUNTER — Ambulatory Visit
Admission: RE | Admit: 2023-03-26 | Discharge: 2023-03-26 | Disposition: A | Discharge status: Home / Self Care | Payer: Medicaid Other | Attending: Family Medicine | Admitting: Family Medicine

## 2023-03-26 VITALS — BP 130/78 | HR 76 | Temp 98.4°F | Resp 18 | Ht 70.0 in | Wt 187.6 lb

## 2023-03-26 DIAGNOSIS — R110 Nausea: Secondary | ICD-10-CM | POA: Diagnosis not present

## 2023-03-26 DIAGNOSIS — K219 Gastro-esophageal reflux disease without esophagitis: Secondary | ICD-10-CM | POA: Diagnosis not present

## 2023-03-26 DIAGNOSIS — M25562 Pain in left knee: Secondary | ICD-10-CM | POA: Insufficient documentation

## 2023-03-26 MED ORDER — NAPROXEN 500 MG PO TABS
ORAL_TABLET | ORAL | 0 refills | Status: DC
Start: 2023-03-26 — End: 2024-03-16

## 2023-03-26 MED ORDER — ONDANSETRON 4 MG PO TBDP
4.0000 mg | ORAL_TABLET | Freq: Three times a day (TID) | ORAL | 1 refills | Status: DC | PRN
Start: 2023-03-26 — End: 2023-05-26

## 2023-03-26 MED ORDER — BACLOFEN 10 MG PO TABS
5.0000 mg | ORAL_TABLET | Freq: Three times a day (TID) | ORAL | 1 refills | Status: DC | PRN
Start: 2023-03-26 — End: 2024-09-12

## 2023-03-26 MED ORDER — OMEPRAZOLE 40 MG PO CPDR
40.0000 mg | DELAYED_RELEASE_CAPSULE | Freq: Every day | ORAL | 2 refills | Status: DC
Start: 2023-03-26 — End: 2023-05-18

## 2023-03-26 NOTE — Patient Instructions (Addendum)
Thank you for coming to the office today.  For acid reflux Start Omeprazole 40mg  daily before breakfast for 6-8 weeks, we can follow up on this.  You most likely have a LEFT knee sprain - this is an injury to the ligaments supporting the knee, it can be a minor injury or small tear, rarely it can turn out to be a larger tear to the ligament if it does not improve, we cannot rule this out. Also I am concerned about the possibility of a meniscus injury (the cartilage shock absorber within the knee)   X-ray today   - I recommend a Knee Compression to support your knee and limit motion of the knee to help it heal, avoid excess weight bearing and repetitive activities on it  Recommend trial of Anti-inflammatory with Naproxen  twice a day with meal for 1-2 weeks  Switch to topical after few weeks   START anti inflammatory topical - OTC Voltaren (generic Diclofenac) topical 2-4 times a day as needed for pain swelling of affected joint for 1-2 weeks or longer.  - DO NOT TAKE any ibuprofen, advil, aleve, motrin while you are taking this medicine   - It is safe to take Tylenol Ext Str 500mg  tabs - take 1 to 2 (max dose 1000mg ) every 6 hours as needed for breakthrough pain, max 24 hour daily dose is 6 to 8 tablets or 4000mg   Start taking Baclofen (Lioresal) 10mg  (muscle relaxant) - start with half (cut) to one whole pill at night as needed for next 1-3 nights (may make you drowsy, caution with driving) see how it affects you, then if tolerated increase to one pill 2 to 3 times a day or (every 8 hours as needed)     Use RICE therapy: - R - Rest / relative rest with activity modification avoid overuse and frequent bending or pressure on bent knee - I - Ice packs (make sure you use a towel or sock / something to protect skin) - C - Compression with ACE wrap or immobilizer apply pressure and reduce swelling allowing more support - E - Elevation - if significant swelling, lift leg above heart level  (toes above your nose) to help reduce swelling, most helpful at night after day of being on your feet   Please schedule a Follow-up Appointment to: Return in about 8 weeks (around 05/21/2023) for 8 week follow-up Left Knee Pain / GERD updates.  If you have any other questions or concerns, please feel free to call the office or send a message through MyChart. You may also schedule an earlier appointment if necessary.  Additionally, you may be receiving a survey about your experience at our office within a few days to 1 week by e-mail or mail. We value your feedback.  Saralyn Pilar, DO [facility], [facility]  Medial Collateral Ligament Sprain, Phase I Rehab Ask your health care provider which exercises are safe for you. Do exercises exactly as told by your health care provider and adjust them as directed. It is normal to feel mild stretching, pulling, tightness, or discomfort as you do these exercises. Stop right away if you feel sudden pain or your pain gets worse. Do not begin these exercises until told by your health care provider. Stretching and range-of-motion exercises These exercises warm up your muscles and joints and improve the movement and flexibility of your knee. These exercises also help to relieve pain. Knee flexion, passive Start this exercise in one of these positions:  Lying on the floor in front of an open doorway with your left / right heel and foot lightly touching higher up on the wall. Lying on the floor with both feet on the wall. Without using any effort (passive), allow gravity to let your foot slide down the wall slowly (flexion)until you feel a gentle stretch in the front of your left / right knee. Hold this stretch for __________ seconds. Return your leg to the starting position, using your healthy leg to do the work or to help if needed. Repeat __________ times. Complete this exercise __________ times a day. Knee flexion, active  Lie on your back  with both legs straight. If this causes back discomfort, bend your healthy knee so your foot is flat on the floor. With your own effort (active), slowly slide your left / right heel back toward your buttocks (flexion). Stop when you feel a gentle stretch in the front of your knee or thigh. Hold this position for __________ seconds. Slowly slide your left / right heel back to the starting position. Repeat __________ times. Complete this exercise __________ times a day. Knee extension, sitting Sit with your left / right heel propped up on a chair, a coffee table, or a footstool. Do not have anything under your knee to support it. Allow your leg muscles to relax, letting gravity straighten out your knee (extension). Do not let your knee roll inward. You should feel a stretch behind your left / right knee. If told by your health care provider, deepen the stretch by placing a __________ lb / kg weight on your thigh, just above your kneecap. Hold this position for __________ seconds. Repeat __________ times. Complete this exercise __________ times a day. Strengthening exercises These exercises build strength and endurance in your knee. Endurance is the ability to use your muscles for a long time, even after they normally get tired. Isometric exercises involve squeezing your muscles without moving your knee joint. Quadriceps, isometric  Lie on your back with your left / right leg extended and your other knee bent. If told by your health care provider, put a rolled towel or small pillow under your left / right knee. Slowly tense the muscles in the front or top of your left / right thigh (quadriceps). You should see your kneecap slide up toward your hip or see increased dimpling just above the knee. This motion will push the back of your knee toward the floor. For __________ seconds, hold the muscle as tight as you can without increasing your pain. Relax the muscles slowly and completely. Repeat __________  times. Complete this exercise __________ times a day. Hamstring, isometric  Lie on your back on a firm surface. Bend your left / right knee about __________ degrees. You can prop your knee on a pillow if needed. Dig your left / right heel down and back into the surface as if you are trying to pull your heel toward your buttocks. Tighten the muscles in the back of your thighs (hamstring) to "dig" as hard as you can without increasing any pain. Hold this position for __________ seconds. Relax your muscles slowly and completely. Repeat __________ times. Complete this exercise __________ times a day. This information is not intended to replace advice given to you by your health care provider. Make sure you discuss any questions you have with your health care provider. Document Revised: 12/26/2019 Document Reviewed: 12/26/2019 Elsevier Patient Education  2024 [employer].

## 2023-03-26 NOTE — Progress Notes (Signed)
Subjective:    Patient ID: Adam Cannon, male    DOB: 1966/05/29, 57 y.o.   MRN: [id number]  [name] is a 57 y.o. male presenting on 03/26/2023 for Knee Pain (Left knee pain that worsen with prolong standing and bending the knee x 2 weeks. He describe it as a aching pain that worsen with bending. )   HPI  LEFT Knee Pain Reports new problem recently onset few days to weeks ago, onset acute without known injury or trigger or fall or trauma. He says if standing and resting he is fine but worse with ambulation and bending knee. He localizes pain to medial aspect of knee. Worse with more activity. Also feels some stiffness and soreness when wake up in morning. - Takes OTC Ibuprofen 200mg  x 2 = 400mg  AS NEEDED not regularly. No prior surgery or x-ray or procedure - Admits occasional swelling - Denies fall injury or trauma, no bruising.  GERD Reports episodic flares of stomach acid worse at night, tried OTC without relief.      03/26/2023    8:40 AM 01/17/2022    8:58 AM 12/20/2021    1:38 PM  Depression screen PHQ 2/9  Decreased Interest 0 0 0  Down, Depressed, Hopeless 0 0 0  PHQ - 2 Score 0 0 0  Altered sleeping 0 0 0  Tired, decreased energy 0 0 0  Change in appetite 0 0 0  Feeling bad or failure about yourself  0 0 0  Trouble concentrating 0 0 0  Moving slowly or fidgety/restless 0 0 0  Suicidal thoughts 0 0 0  PHQ-9 Score 0 0 0  Difficult doing work/chores Not difficult at all Not difficult at all Not difficult at all    Social History   Tobacco Use   Smoking status: Never    Passive exposure: Never   Smokeless tobacco: Never  Vaping Use   Vaping Use: Never used  Substance Use Topics   Alcohol use: No   Drug use: Never    Review of Systems Per HPI unless specifically indicated above     Objective:    BP 130/78 (BP Location: Right Arm, Patient Position: Sitting, Cuff Size: Large)   Pulse 76   Temp 98.4 F (36.9 C) (Oral)   Resp 18   Ht 5\' 10"  (1.778 m)    Wt 187 lb 9.6 oz (85.1 kg)   SpO2 98%   BMI 26.92 kg/m   Wt Readings from Last 3 Encounters:  03/26/23 187 lb 9.6 oz (85.1 kg)  01/28/23 180 lb (81.6 kg)  01/01/23 179 lb 3.2 oz (81.3 kg)    Physical Exam Vitals and nursing note reviewed.  Constitutional:      General: He is not in acute distress.    Appearance: Normal appearance. He is well-developed. He is not diaphoretic.     Comments: Well-appearing, comfortable, cooperative  HENT:     Head: Normocephalic and atraumatic.  Eyes:     General:        Right eye: No discharge.        Left eye: No discharge.     Conjunctiva/sclera: Conjunctivae normal.  Cardiovascular:     Rate and Rhythm: Normal rate.  Pulmonary:     Effort: Pulmonary effort is normal.  Musculoskeletal:     Comments: Left Knee Inspection: Normal appearance and symmetrical. No ecchymosis or effusion. Palpation: Mild +TTP Left knee only medial joint line. Bilateral crepitus ROM: Full active ROM bilaterally Special  Testing: Lachman / Valgus/Varus tests negative with intact ligaments (ACL, MCL, LCL). Standing Thessaly with provoked pain with medial movement. Strength: 5/5 intact knee flex/ext, ankle dorsi/plantarflex Neurovascular: distally intact sensation light touch and pulses   Skin:    General: Skin is warm and dry.     Findings: No erythema or rash.  Neurological:     Mental Status: He is alert and oriented to person, place, and time.  Psychiatric:        Mood and Affect: Mood normal.        Behavior: Behavior normal.        Thought Content: Thought content normal.     Comments: Well groomed, good eye contact, normal speech and thoughts    Results for orders placed or performed in visit on 01/28/23  CULTURE, URINE COMPREHENSIVE   Specimen: Urine   UR  Result Value Ref Range   Urine Culture, Comprehensive Final report    Organism ID, Bacteria Comment   Microscopic Examination   Urine  Result Value Ref Range   WBC, UA 0-5 0 - 5 /hpf   RBC,  Urine 0-2 0 - 2 /hpf   Epithelial Cells (non renal) 0-10 0 - 10 /hpf   Crystals Present (A) N/A   Crystal Type Amorphous Sediment N/A   Bacteria, UA Moderate (A) None seen/Few  Urinalysis, Complete  Result Value Ref Range   Specific Gravity, UA 1.020 1.005 - 1.030   pH, UA 7.5 5.0 - 7.5   Color, UA Yellow Yellow   Appearance Ur Hazy (A) Clear   Leukocytes,UA Negative Negative   Protein,UA Negative Negative/Trace   Glucose, UA Negative Negative   Ketones, UA Negative Negative   RBC, UA Negative Negative   Bilirubin, UA Negative Negative   Urobilinogen, Ur 0.2 0.2 - 1.0 mg/dL   Nitrite, UA Negative Negative   Microscopic Examination See below:       Assessment & Plan:   Problem List Items Addressed This Visit     Gastroesophageal reflux disease without esophagitis   Relevant Medications   omeprazole (PRILOSEC) 40 MG capsule   ondansetron (ZOFRAN-ODT) 4 MG disintegrating tablet   Left medial knee pain - Primary   Relevant Medications   naproxen (NAPROSYN) 500 MG tablet   baclofen (LIORESAL) 10 MG tablet   Other Relevant Orders   DG Knee Complete 4 Views Left   Other Visit Diagnoses     Nausea       Relevant Medications   ondansetron (ZOFRAN-ODT) 4 MG disintegrating tablet       Acute L medial Knee pain without known injury or trauma Possible underlying OA/DJD Concern for meniscus injury vs sprain - Able to bear weight, no knee instability, mechanical locking - No prior history of knee surgery, arthroscopy - Inadequate conservative therapy  Plan: 1. Start anti-inflammatory trial with rx Naprosyn 500mg  BID wc x 2-4 weeks, then PRN 2. Start Tylenol 500-1000mg  per dose TID PRN breakthrough - Start muscle relaxant with Baclofen 10mg  tabs - take 5-10mg  up to TID PRN, titrate up as tolerated 3. RICE therapy (rest, ice, compression, elevation) for swelling, activity modification  X-ray today, follow up result  Follow-up 4+  weeks, if still worsening, consider steroid  injection and referral to Ortho for further eval, may need MRI if concern remains for meniscus / ligament injury  GERD Trial on PPI therapy 40mg  daily  Meds ordered this encounter  Medications   omeprazole (PRILOSEC) 40 MG capsule    Sig: Take 1  capsule (40 mg total) by mouth daily before breakfast.    Dispense:  30 capsule    Refill:  2   naproxen (NAPROSYN) 500 MG tablet    Sig: TAKE 1 TABLET BY MOUTH TWICE DAILY WITH MEALS FOR  2  TO  4  WEEKS,  THEN  AS  NEEDED    Dispense:  60 tablet    Refill:  0   baclofen (LIORESAL) 10 MG tablet    Sig: Take 0.5-1 tablets (5-10 mg total) by mouth 3 (three) times daily as needed for muscle spasms.    Dispense:  30 each    Refill:  1   ondansetron (ZOFRAN-ODT) 4 MG disintegrating tablet    Sig: Take 1 tablet (4 mg total) by mouth every 8 (eight) hours as needed for nausea or vomiting.    Dispense:  20 tablet    Refill:  1    Follow up plan: Return in about 8 weeks (around 05/21/2023) for 8 week follow-up Left Knee Pain / GERD updates.  Saralyn Pilar, DO [facility] 03/26/2023, 8:49 AM

## 2023-03-28 ENCOUNTER — Encounter: Payer: Self-pay | Admitting: Family Medicine

## 2023-04-07 ENCOUNTER — Ambulatory Visit: Payer: Self-pay | Admitting: *Deleted

## 2023-04-07 DIAGNOSIS — M1712 Unilateral primary osteoarthritis, left knee: Secondary | ICD-10-CM

## 2023-04-07 DIAGNOSIS — M25562 Pain in left knee: Secondary | ICD-10-CM

## 2023-04-07 NOTE — Telephone Encounter (Signed)
I answered a similar question on patient's MyChart 1 week ago.  I am not 100% sure MRI will be covered at this time. It is the next imaging he needs, but we have only see him for this issue starting 03/26/23.  Sometimes insurance requires 3-4+ weeks of treatment before they will approve MRI.  I will go ahead and order it because I agree it is the next imaging step.  Routing chart to Marcus to help with authorization and scheduling.  Please let patient know that after MRI he would need to be referred to Orthopedic or Sports Medicine doctor for further management to review the MRI.  Saralyn Pilar, DO [facility] 04/07/2023, 4:44 PM

## 2023-04-07 NOTE — Addendum Note (Signed)
Addended by: Smitty Cords on: 04/07/2023 04:44 PM   Modules accepted: Orders

## 2023-04-07 NOTE — Telephone Encounter (Signed)
  Chief Complaint: Knee Pain Symptoms: Left knee pain 10/10 "Can hardly bend, swelling increased." Can walk but "Painful."  Frequency: Seen 03/26/23 Pertinent Negatives: Patient denies  Disposition: [] ED /[] Urgent Care (no appt availability in office) / [] Appointment(In office/virtual)/ []  [facility]/ [] Home Care/ [] Refused Recommended Disposition /[] [facility]/ [x]  Follow-up with PCP Additional Notes: Pt requesting MRI. States "I need something done, see what is wrong." Assured NT would route to practice for PCPs review and final disposition.  Pt states may respond in MyChart. Please advise.  Reason for Disposition  [1] SEVERE pain (e.g., excruciating, unable to walk) AND [2] not improved after 2 hours of pain medicine  Answer Assessment - Initial Assessment Questions 1. LOCATION and RADIATION: "Where is the pain located?"      Left knee 2. QUALITY: "What does the pain feel like?"  (e.g., sharp, dull, aching, burning)     *No Answer* 3. SEVERITY: "How bad is the pain?" "What does it keep you from doing?"   (Scale 1-10; or mild, moderate, severe)   -  MILD (1-3): doesn't interfere with normal activities    -  MODERATE (4-7): interferes with normal activities (e.g., work or school) or awakens from sleep, limping    -  SEVERE (8-10): excruciating pain, unable to do any normal activities, unable to walk     10/10 4. ONSET: "When did the pain start?" "Does it come and go, or is it there all the time?"     Seen 03/31/23 5. RECURRENT: "Have you had this pain before?" If Yes, ask: "When, and what happened then?"      6. SETTING: "Has there been any recent work, exercise or other activity that involved that part of the body?"       7. AGGRAVATING FACTORS: "What makes the knee pain worse?" (e.g., walking, climbing stairs, running)      8. ASSOCIATED SYMPTOMS: "Is there any swelling or redness of the knee?"      9. OTHER SYMPTOMS: "Do you have any other symptoms?" (e.g.,  chest pain, difficulty breathing, fever, calf pain)  Protocols used: Knee Pain-A-AH

## 2023-04-09 ENCOUNTER — Ambulatory Visit
Admission: RE | Admit: 2023-04-09 | Discharge: 2023-04-09 | Disposition: A | Discharge status: Home / Self Care | Payer: Medicaid Other | Source: Ambulatory Visit | Attending: Family Medicine | Admitting: Family Medicine

## 2023-04-09 DIAGNOSIS — M1712 Unilateral primary osteoarthritis, left knee: Secondary | ICD-10-CM | POA: Insufficient documentation

## 2023-04-09 DIAGNOSIS — M25562 Pain in left knee: Secondary | ICD-10-CM | POA: Insufficient documentation

## 2023-04-16 ENCOUNTER — Encounter: Payer: Self-pay | Admitting: Family Medicine

## 2023-04-16 ENCOUNTER — Ambulatory Visit: Payer: Medicaid Other | Admitting: Family Medicine

## 2023-04-16 VITALS — BP 118/76 | HR 85 | Resp 18 | Ht 70.0 in | Wt 195.4 lb

## 2023-04-16 DIAGNOSIS — S83412A Sprain of medial collateral ligament of left knee, initial encounter: Secondary | ICD-10-CM

## 2023-04-16 DIAGNOSIS — S83242A Other tear of medial meniscus, current injury, left knee, initial encounter: Secondary | ICD-10-CM | POA: Diagnosis not present

## 2023-04-16 DIAGNOSIS — M25562 Pain in left knee: Secondary | ICD-10-CM | POA: Diagnosis not present

## 2023-04-16 NOTE — Patient Instructions (Addendum)
Thank you for coming to the office today.  MRI result copied below.  IMPRESSION:  1. Medial meniscal tear.  2. Grade 1 MCL sprain.  3. Mild medial and patellofemoral compartment osteoarthritis.  4. Small joint effusion.  5. Moderate-sized leaking Baker's cyst.   Referral to [facility]  [facility] (formerly [facility] Address: [address] [place], [place], [place] [address] Hours:  9AM-5PM Phone: [phone]  Please schedule a Follow-up Appointment to: Return if symptoms worsen or fail to improve.  If you have any other questions or concerns, please feel free to call the office or send a message through MyChart. You may also schedule an earlier appointment if necessary.  Additionally, you may be receiving a survey about your experience at our office within a few days to 1 week by e-mail or mail. We value your feedback.  Saralyn Pilar, DO [facility], [place]

## 2023-04-16 NOTE — Progress Notes (Signed)
Subjective:    Patient ID: Adam Cannon, male    DOB: Dec 18, 1965, 57 y.o.   MRN: [id number]  Adam Cannon is a 57 y.o. male presenting on 04/16/2023 for Knee Pain (Pt comes ini)   HPI  LEFT Knee Pain Follow up from previous visit 03/26/23 Background, onset acute without known injury or trigger or fall or trauma. He says if standing and resting he is fine but worse with ambulation and bending knee. He localizes pain to medial aspect of knee. Worse with more activity. Also feels some stiffness and soreness when wake up in morning.  He has had L Knee X-ray 03/26/23 see below, tricompartmental mild arthritis  Trial on NSAID, Muscle relaxant, brace, conservative care, Tylenol. Limited results No prior surgery or x-ray or procedure  Today here to review results after L Knee MRI. Resulted today. See below, shows medial meniscus tear.  He still has same symptoms, not resolving. Requesting referral now.  - Admits occasional swelling - Denies fall injury or trauma, no bruising.      04/16/2023   10:59 AM 03/26/2023    8:40 AM 01/17/2022    8:58 AM  Depression screen PHQ 2/9  Decreased Interest 0 0 0  Down, Depressed, Hopeless 0 0 0  PHQ - 2 Score 0 0 0  Altered sleeping 0 0 0  Tired, decreased energy 0 0 0  Change in appetite 0 0 0  Feeling bad or failure about yourself  0 0 0  Trouble concentrating 0 0 0  Moving slowly or fidgety/restless 0 0 0  Suicidal thoughts 0 0 0  PHQ-9 Score 0 0 0  Difficult doing work/chores Not difficult at all Not difficult at all Not difficult at all    Social History   Tobacco Use   Smoking status: Never    Passive exposure: Never   Smokeless tobacco: Never  Vaping Use   Vaping Use: Never used  Substance Use Topics   Alcohol use: No   Drug use: Never    Review of Systems Per HPI unless specifically indicated above     Objective:    BP 118/76 (BP Location: Right Arm, Patient Position: Sitting, Cuff Size: Large)   Pulse 85   Resp 18   Ht  5\' 10"  (1.778 m)   Wt 195 lb 6.4 oz (88.6 kg)   SpO2 99%   BMI 28.04 kg/m   Wt Readings from Last 3 Encounters:  04/16/23 195 lb 6.4 oz (88.6 kg)  03/26/23 187 lb 9.6 oz (85.1 kg)  01/28/23 180 lb (81.6 kg)    Physical Exam Vitals and nursing note reviewed.  Constitutional:      General: He is not in acute distress.    Appearance: Normal appearance. He is well-developed. He is not diaphoretic.     Comments: Well-appearing, comfortable, cooperative  HENT:     Head: Normocephalic and atraumatic.  Eyes:     General:        Right eye: No discharge.        Left eye: No discharge.     Conjunctiva/sclera: Conjunctivae normal.  Cardiovascular:     Rate and Rhythm: Normal rate.  Pulmonary:     Effort: Pulmonary effort is normal.  Musculoskeletal:     Comments: Left Knee - exam unchanged Inspection: Normal appearance and symmetrical. No ecchymosis or effusion. Palpation: Mild +TTP Left knee only medial joint line. Bilateral crepitus ROM: Full active ROM bilaterally Special Testing: Lachman / Valgus/Varus tests negative  with intact ligaments (ACL, MCL, LCL). Standing Thessaly with provoked pain with medial movement. Strength: 5/5 intact knee flex/ext, ankle dorsi/plantarflex Neurovascular: distally intact sensation light touch and pulses  Skin:    General: Skin is warm and dry.     Findings: No erythema or rash.  Neurological:     Mental Status: He is alert and oriented to person, place, and time.  Psychiatric:        Mood and Affect: Mood normal.        Behavior: Behavior normal.        Thought Content: Thought content normal.     Comments: Well groomed, good eye contact, normal speech and thoughts     MR Knee Left Wo Contrast [[id number]] Resulted: 04/16/23 1108  Order Status: Completed Updated: 04/16/23 1110  Narrative:    CLINICAL DATA:  Medial left knee pain and swelling for 6 weeks  EXAM: MRI OF THE LEFT KNEE WITHOUT CONTRAST  TECHNIQUE: Multiplanar, multisequence  MR imaging of the knee was performed. No intravenous contrast was administered.  COMPARISON:  X-ray 03/26/2023  FINDINGS: MENISCI  Medial meniscus: Complex tearing of the medial meniscus at the posterior horn-body junction with irregular radial component.  Lateral meniscus:  Intact.  LIGAMENTS  Cruciates: Intact ACL and PCL.  Collaterals: Intact MCL with periligamentous edema. Lateral collateral ligament complex intact.  CARTILAGE  Patellofemoral: Partial-thickness cartilage loss within the central trochlear groove. No patellar cartilage defect.  Medial: 5 mm partial-thickness cartilage defect of the central weight-bearing medial femoral condyle.  Lateral:  No chondral defect.  MISCELLANEOUS  Joint:  Small joint effusion. Fat pads within normal limits.  Popliteal Fossa: Moderate-sized Baker's cyst containing a loose body. Cyst is leaking inferiorly. Intact popliteus tendon.  Extensor Mechanism:  Intact quadriceps and patellar tendons.  Bones: No acute fracture. No dislocation. No bone marrow edema. No marrow replacing bone lesion.  Other: No significant periarticular soft tissue findings.  IMPRESSION: 1. Medial meniscal tear. 2. Grade 1 MCL sprain. 3. Mild medial and patellofemoral compartment osteoarthritis. 4. Small joint effusion. 5. Moderate-sized leaking Baker's cyst.   Electronically Signed   By: Duanne Guess D.O.   On: 04/16/2023 11:08    I have personally reviewed the radiology report from 03/26/23 on L Knee.  DG Knee Complete 4 Views Left [[id number]] Resulted: 03/30/23 1341  Order Status: Completed Updated: 03/30/23 1344  Narrative:    CLINICAL DATA:  Medial left knee pain for the past 2 weeks. No injury or prior surgery.  EXAM: LEFT KNEE - COMPLETE 4+ VIEW  COMPARISON:  None Available.  FINDINGS: No acute fracture or dislocation. Trace joint effusion. Small tricompartmental marginal osteophytes. Joint spaces are preserved. Bone  mineralization is normal. Soft tissues are unremarkable.  IMPRESSION: 1. Mild tricompartmental osteoarthritis.   Electronically Signed   By: Obie Dredge M.D.   On: 03/30/2023 13:41    Results for orders placed or performed in visit on 01/28/23  CULTURE, URINE COMPREHENSIVE   Specimen: Urine   UR  Result Value Ref Range   Urine Culture, Comprehensive Final report    Organism ID, Bacteria Comment   Microscopic Examination   Urine  Result Value Ref Range   WBC, UA 0-5 0 - 5 /hpf   RBC, Urine 0-2 0 - 2 /hpf   Epithelial Cells (non renal) 0-10 0 - 10 /hpf   Crystals Present (A) N/A   Crystal Type Amorphous Sediment N/A   Bacteria, UA Moderate (A) None seen/Few  Urinalysis, Complete  Result  Value Ref Range   Specific Gravity, UA 1.020 1.005 - 1.030   pH, UA 7.5 5.0 - 7.5   Color, UA Yellow Yellow   Appearance Ur Hazy (A) Clear   Leukocytes,UA Negative Negative   Protein,UA Negative Negative/Trace   Glucose, UA Negative Negative   Ketones, UA Negative Negative   RBC, UA Negative Negative   Bilirubin, UA Negative Negative   Urobilinogen, Ur 0.2 0.2 - 1.0 mg/dL   Nitrite, UA Negative Negative   Microscopic Examination See below:       Assessment & Plan:   Problem List Items Addressed This Visit     Left medial knee pain - Primary   Other Visit Diagnoses     Tear of medial meniscus of left knee, current, unspecified tear type, initial encounter          Persistent problem, follow up today we reviewed MRI Results L Knee, see above.  4-8 weeks of Left knee medial pain swelling worse with activity weightbearing Prior X-ray showed mild tricompartmental arthritis Unresolved w/ therapy has tried NSAID bracing muscle relaxant  L Knee MRI showing medial meniscal tear, medial MCL sprain Grade 1, Baker's Cyst  Referral to Orthopedics submitted for further management.  Orders Placed This Encounter  Procedures   Ambulatory referral to Orthopedic Surgery    Referral  Priority:   Routine    Referral Type:   Surgical    Referral Reason:   Specialty Services Required    Requested Specialty:   Orthopedic Surgery    Number of Visits Requested:   1    No orders of the defined types were placed in this encounter.     Follow up plan: Return if symptoms worsen or fail to improve.  Saralyn Pilar, DO [facility] 04/16/2023, 11:13 AM

## 2023-05-07 ENCOUNTER — Ambulatory Visit: Payer: Medicaid Other | Admitting: Family Medicine

## 2023-05-18 ENCOUNTER — Encounter: Payer: Self-pay | Admitting: Family Medicine

## 2023-05-18 ENCOUNTER — Ambulatory Visit: Payer: MEDICAID | Admitting: Family Medicine

## 2023-05-18 VITALS — BP 120/80 | HR 81 | Resp 17 | Ht 70.0 in | Wt 197.0 lb

## 2023-05-18 DIAGNOSIS — R7309 Other abnormal glucose: Secondary | ICD-10-CM

## 2023-05-18 DIAGNOSIS — E559 Vitamin D deficiency, unspecified: Secondary | ICD-10-CM

## 2023-05-18 DIAGNOSIS — E7800 Pure hypercholesterolemia, unspecified: Secondary | ICD-10-CM | POA: Diagnosis not present

## 2023-05-18 DIAGNOSIS — K219 Gastro-esophageal reflux disease without esophagitis: Secondary | ICD-10-CM

## 2023-05-18 DIAGNOSIS — R7989 Other specified abnormal findings of blood chemistry: Secondary | ICD-10-CM | POA: Diagnosis not present

## 2023-05-18 DIAGNOSIS — E538 Deficiency of other specified B group vitamins: Secondary | ICD-10-CM

## 2023-05-18 MED ORDER — OMEPRAZOLE 40 MG PO CPDR: 40.0000 mg | DELAYED_RELEASE_CAPSULE | Freq: Every day | ORAL | 3 refills | Status: DC

## 2023-05-18 NOTE — Patient Instructions (Addendum)
Thank you for coming to the office today.  Keep up with Dr Martha Clan on the Knee  Continue to discuss the sleep with Dr Georjean Mode  ---------------------------------------  Refilled Omeprazole 40mg  daily.  ---------------------  Checking lab for the following  A1c sugar Testosterone Thyroid panel Vitamin Deficiency (B12, D)  DUE for FASTING BLOOD WORK (no food or drink after midnight before the lab appointment, only water or coffee without cream/sugar on the morning of)  SCHEDULE "Lab Only" visit in the morning at the clinic for lab draw in 06/01/23 - at 830am  - Make sure Lab Only appointment is at about 1 week before your next appointment, so that results will be available  For Lab Results, once available within 2-3 days of blood draw, you can can log in to MyChart online to view your results and a brief explanation. Also, we can discuss results at next follow-up visit.   Please schedule a Follow-up Appointment to: Return for lab only 06/01/23.  If you have any other questions or concerns, please feel free to call the office or send a message through MyChart. You may also schedule an earlier appointment if necessary.  Additionally, you may be receiving a survey about your experience at our office within a few days to 1 week by e-mail or mail. We value your feedback.  Saralyn Pilar, DO [facility], [place]

## 2023-05-18 NOTE — Progress Notes (Signed)
Subjective:    Patient ID: Adam Cannon, male    DOB: 17-Oct-1966, 57 y.o.   MRN: [id number]  Adam Cannon is a 57 y.o. male presenting on 05/18/2023 for Follow-up (Left Knee pain.  Seeing [facility] and will be having surgery in 10/24 for torn meniscus.  Patient would like to make sure his sugar is doing ok. )   HPI   Elevated A1c Previuosly, last result improved. We can repeat lab in future. He admits some concern over elevated sugars  GERD Controlled on PPI Request re order Omeprazole 40mg   Knee, L, meniscus tear [facility] Dr Martha Clan - arthroscopic procedure L knee, upcoming oct 2024 Arthoscopic, meniscus repair L knee  Insomnia Goes to bed around 11-12 and then usually 3-4am he wakes up uncertain as to why No obvious sleep apnea or other provoking factor On medication management per Psychiatry - Dr Georjean Mode      04/16/2023   10:59 AM 03/26/2023    8:40 AM 01/17/2022    8:58 AM  Depression screen PHQ 2/9  Decreased Interest 0 0 0  Down, Depressed, Hopeless 0 0 0  PHQ - 2 Score 0 0 0  Altered sleeping 0 0 0  Tired, decreased energy 0 0 0  Change in appetite 0 0 0  Feeling bad or failure about yourself  0 0 0  Trouble concentrating 0 0 0  Moving slowly or fidgety/restless 0 0 0  Suicidal thoughts 0 0 0  PHQ-9 Score 0 0 0  Difficult doing work/chores Not difficult at all Not difficult at all Not difficult at all    Social History   Tobacco Use   Smoking status: Never    Passive exposure: Never   Smokeless tobacco: Never  Vaping Use   Vaping status: Never Used  Substance Use Topics   Alcohol use: No   Drug use: Never    Review of Systems Per HPI unless specifically indicated above     Objective:    BP 120/80 (BP Location: Left Arm, Patient Position: Sitting, Cuff Size: Normal)   Pulse 81   Resp 17   Ht 5\' 10"  (1.778 m)   Wt 197 lb (89.4 kg)   SpO2 98%   BMI 28.27 kg/m   Wt Readings from Last 3 Encounters:  05/18/23 197 lb (89.4 kg)  04/16/23  195 lb 6.4 oz (88.6 kg)  03/26/23 187 lb 9.6 oz (85.1 kg)    Physical Exam Vitals and nursing note reviewed.  Constitutional:      General: He is not in acute distress.    Appearance: Normal appearance. He is well-developed. He is not diaphoretic.     Comments: Well-appearing, comfortable, cooperative  HENT:     Head: Normocephalic and atraumatic.  Eyes:     General:        Right eye: No discharge.        Left eye: No discharge.     Conjunctiva/sclera: Conjunctivae normal.  Cardiovascular:     Rate and Rhythm: Normal rate.  Pulmonary:     Effort: Pulmonary effort is normal.  Skin:    General: Skin is warm and dry.     Findings: No erythema or rash.  Neurological:     Mental Status: He is alert and oriented to person, place, and time.  Psychiatric:        Mood and Affect: Mood normal.        Behavior: Behavior normal.  Thought Content: Thought content normal.     Comments: Well groomed, good eye contact, normal speech and thoughts    Results for orders placed or performed in visit on 01/28/23  CULTURE, URINE COMPREHENSIVE   Specimen: Urine   UR  Result Value Ref Range   Urine Culture, Comprehensive Final report    Organism ID, Bacteria Comment   Microscopic Examination   Urine  Result Value Ref Range   WBC, UA 0-5 0 - 5 /hpf   RBC, Urine 0-2 0 - 2 /hpf   Epithelial Cells (non renal) 0-10 0 - 10 /hpf   Crystals Present (A) N/A   Crystal Type Amorphous Sediment N/A   Bacteria, UA Moderate (A) None seen/Few  Urinalysis, Complete  Result Value Ref Range   Specific Gravity, UA 1.020 1.005 - 1.030   pH, UA 7.5 5.0 - 7.5   Color, UA Yellow Yellow   Appearance Ur Hazy (A) Clear   Leukocytes,UA Negative Negative   Protein,UA Negative Negative/Trace   Glucose, UA Negative Negative   Ketones, UA Negative Negative   RBC, UA Negative Negative   Bilirubin, UA Negative Negative   Urobilinogen, Ur 0.2 0.2 - 1.0 mg/dL   Nitrite, UA Negative Negative   Microscopic  Examination See below:       Assessment & Plan:   Problem List Items Addressed This Visit     Gastroesophageal reflux disease without esophagitis - Primary   Relevant Medications   omeprazole (PRILOSEC) 40 MG capsule   Other Visit Diagnoses     Elevated hemoglobin A1c          Knee L meniscus tear Upcoming arthroscopic repair [facility] Dr Martha Clan  Insomnia / Mood Fatigue Continue to discuss the sleep and medications with Dr Georjean Mode Psychiatry  --------------------------------------- GERD Controlled Refilled Omeprazole 40mg  daily.  ---------------------  Evaluation of fatigue low energy Checking lab for the following  A1c sugar Testosterone Thyroid panel Vitamin Deficiency (B12, D)  Meds ordered this encounter  Medications   omeprazole (PRILOSEC) 40 MG capsule    Sig: Take 1 capsule (40 mg total) by mouth daily before breakfast.    Dispense:  90 capsule    Refill:  3      Follow up plan: Return for lab only 06/01/23.  Future labs ordered for 06/01/23  A1c sugar Testosterone Thyroid panel Vitamin Deficiency (B12, D)  Saralyn Pilar, DO [facility] 05/18/2023, 3:17 PM

## 2023-05-25 ENCOUNTER — Other Ambulatory Visit: Payer: Self-pay | Admitting: Family Medicine

## 2023-05-25 DIAGNOSIS — K219 Gastro-esophageal reflux disease without esophagitis: Secondary | ICD-10-CM

## 2023-05-25 DIAGNOSIS — R110 Nausea: Secondary | ICD-10-CM

## 2023-05-25 NOTE — Telephone Encounter (Signed)
Medication Refill - Medication: ondansetron (ZOFRAN-ODT) 4 MG disintegrating tablet [[id number]]   Has the patient contacted their pharmacy? Yes.   (Agent: If no, request that the patient contact the pharmacy for the refill. If patient does not wish to contact the pharmacy document the reason why and proceed with request.) (Agent: If yes, when and what did the pharmacy advise?)  Preferred Pharmacy (with phone number or street name):  [facility] [address] [place]), [place] - [address] Phone: [phone]  Fax: [phone]     Has the patient been seen for an appointment in the last year OR does the patient have an upcoming appointment? Yes.    Agent: Please be advised that RX refills may take up to 3 business days. We ask that you follow-up with your pharmacy.

## 2023-05-26 MED ORDER — ONDANSETRON 4 MG PO TBDP
4.0000 mg | ORAL_TABLET | Freq: Three times a day (TID) | ORAL | 1 refills | Status: DC | PRN
Start: 2023-05-26 — End: 2023-08-18

## 2023-05-26 NOTE — Telephone Encounter (Signed)
Requested medication (s) are due for refill today: Yes  Requested medication (s) are on the active medication list: Yes  Last refill:  03/26/23 #20 0RF  Future visit scheduled: Yes  Notes to clinic:  Unable to refill per protocol, cannot delegate.      Requested Prescriptions  Pending Prescriptions Disp Refills   ondansetron (ZOFRAN-ODT) 4 MG disintegrating tablet [Pharmacy Med Name: Ondansetron 4 MG Oral Tablet Disintegrating] 20 tablet 0    Sig: DISSOLVE 1 TABLET IN MOUTH EVERY 8 HOURS AS NEEDED FOR NAUSEA FOR VOMITING     Not Delegated - Gastroenterology: Antiemetics - ondansetron Failed - 05/25/2023  5:47 PM      Failed - This refill cannot be delegated      Passed - AST in normal range and within 360 days    AST  Date Value Ref Range Status  11/05/2022 27 15 - 41 U/L Final   SGOT(AST)  Date Value Ref Range Status  02/19/2012 26 15 - 37 Unit/L Final         Passed - ALT in normal range and within 360 days    ALT  Date Value Ref Range Status  11/05/2022 32 0 - 44 U/L Final   SGPT (ALT)  Date Value Ref Range Status  02/19/2012 34 U/L Final    Comment:    12-78 NOTE: NEW REFERENCE RANGE 09/12/2011          Passed - Valid encounter within last 6 months    Recent Outpatient Visits           1 week ago Gastroesophageal reflux disease without esophagitis   [place] [facility] Smitty Cords, DO   1 month ago Tear of medial meniscus of left knee, current, unspecified tear type, initial encounter   [place] [facility] [place], Netta Neat, DO   2 months ago Left medial knee pain   [place] [facility] Smitty Cords, DO   4 months ago Urethral discharge in male   [place] [facility] [place], Netta Neat, DO   5 months ago Neck pain on left side    [facility] [place], Netta Neat, DO       Future Appointments             In  3 months Althea Charon, Netta Neat, DO  [facility], [facility]

## 2023-05-26 NOTE — Telephone Encounter (Signed)
Requested medication (s) are due for refill today: routing for review  Requested medication (s) are on the active medication list: yes  Last refill:  03/26/23  Future visit scheduled: yes  Notes to clinic:  Unable to refill per protocol, cannot delegate.      Requested Prescriptions  Pending Prescriptions Disp Refills   ondansetron (ZOFRAN-ODT) 4 MG disintegrating tablet 20 tablet 1    Sig: Take 1 tablet (4 mg total) by mouth every 8 (eight) hours as needed for nausea or vomiting.     Not Delegated - Gastroenterology: Antiemetics - ondansetron Failed - 05/26/2023  8:07 AM      Failed - This refill cannot be delegated      Passed - AST in normal range and within 360 days    AST  Date Value Ref Range Status  11/05/2022 27 15 - 41 U/L Final   SGOT(AST)  Date Value Ref Range Status  02/19/2012 26 15 - 37 Unit/L Final         Passed - ALT in normal range and within 360 days    ALT  Date Value Ref Range Status  11/05/2022 32 0 - 44 U/L Final   SGPT (ALT)  Date Value Ref Range Status  02/19/2012 34 U/L Final    Comment:    12-78 NOTE: NEW REFERENCE RANGE 09/12/2011          Passed - Valid encounter within last 6 months    Recent Outpatient Visits           1 week ago Gastroesophageal reflux disease without esophagitis   [place] [facility] [place], Netta Neat, DO   1 month ago Tear of medial meniscus of left knee, current, unspecified tear type, initial encounter   [place] [facility] [place], Netta Neat, DO   2 months ago Left medial knee pain   [place] [facility] Smitty Cords, DO   4 months ago Urethral discharge in male   [place] [facility] [place], Netta Neat, DO   5 months ago Neck pain on left side   [place] [facility], Netta Neat, DO       Future Appointments             In 3 months Althea Charon, Netta Neat, DO   [facility], [facility]

## 2023-06-01 ENCOUNTER — Other Ambulatory Visit: Payer: MEDICAID

## 2023-06-01 DIAGNOSIS — E7800 Pure hypercholesterolemia, unspecified: Secondary | ICD-10-CM

## 2023-06-01 DIAGNOSIS — E559 Vitamin D deficiency, unspecified: Secondary | ICD-10-CM

## 2023-06-01 DIAGNOSIS — E538 Deficiency of other specified B group vitamins: Secondary | ICD-10-CM

## 2023-06-01 DIAGNOSIS — R7309 Other abnormal glucose: Secondary | ICD-10-CM

## 2023-06-01 DIAGNOSIS — R7989 Other specified abnormal findings of blood chemistry: Secondary | ICD-10-CM

## 2023-06-03 ENCOUNTER — Other Ambulatory Visit: Payer: Self-pay | Admitting: Family Medicine

## 2023-06-03 DIAGNOSIS — R7989 Other specified abnormal findings of blood chemistry: Secondary | ICD-10-CM

## 2023-06-12 ENCOUNTER — Telehealth: Payer: Self-pay

## 2023-06-12 NOTE — Telephone Encounter (Signed)
Called patient and left VM. He needs to schedule a Pre-Op Eval Office visit. he is scheduled for 07/28/23, for Left Knee Meniscus surgery w Dr Martha Clan.  He will need an AM apt with CBC BMP A1c UA and EKG.   PEC please schedule if patient calls back.  - [name]

## 2023-06-23 ENCOUNTER — Encounter: Payer: Self-pay | Admitting: Urology

## 2023-06-23 ENCOUNTER — Ambulatory Visit: Payer: MEDICAID | Admitting: Urology

## 2023-06-23 VITALS — BP 139/92 | HR 85 | Ht 70.5 in | Wt 197.0 lb

## 2023-06-23 DIAGNOSIS — R202 Paresthesia of skin: Secondary | ICD-10-CM

## 2023-06-23 DIAGNOSIS — M542 Cervicalgia: Secondary | ICD-10-CM

## 2023-06-23 DIAGNOSIS — M79602 Pain in left arm: Secondary | ICD-10-CM

## 2023-06-23 DIAGNOSIS — E291 Testicular hypofunction: Secondary | ICD-10-CM | POA: Diagnosis not present

## 2023-06-23 DIAGNOSIS — R200 Anesthesia of skin: Secondary | ICD-10-CM | POA: Diagnosis not present

## 2023-06-23 NOTE — Progress Notes (Signed)
   06/23/2023 12:30 PM   Adam Cannon 08-Jan-1966 [id number]  Reason for visit: Follow up urethral discharge, low testosterone, PSA screening  HPI: 57 year old male with history of schizophrenia and cocaine abuse who I saw in April 2024 for urethral discharge.  STD testing was negative, this ultimately resolved with a 2-week course of doxycycline.  PSA was also normal at that time at 0.44.  He is re-referred for evaluation of low testosterone.  Testosterone on 06/01/2023 was low at 139.  He denies any recent drug use, most recent drug screen from January 2024 positive for cocaine.  His primary symptoms are decreased energy, apathy, fatigue.  He denies any ED.  We reviewed the AUA guidelines regarding evaluation and management of patients with low testosterone, and that 2 values <300 are needed to consider testosterone replacement therapy.  Risk and benefits of testosterone replacement reviewed extensively.  He is not interested in further pregnancies.  We discussed the impact of exogenous testosterone on fertility.  RTC 3 to 4 weeks for repeat blood work including testosterone, LH, H/H, and consideration of testosterone replacement therapy if testosterone remains low  Sondra Come, MD  [facility] [address], [address] [place], [place] [address] [phone]

## 2023-06-23 NOTE — Patient Instructions (Signed)

## 2023-07-01 ENCOUNTER — Encounter: Payer: Self-pay | Admitting: Family Medicine

## 2023-07-01 ENCOUNTER — Ambulatory Visit: Payer: MEDICAID | Admitting: Family Medicine

## 2023-07-01 VITALS — BP 112/76 | HR 85 | Ht 70.5 in | Wt 199.0 lb

## 2023-07-01 DIAGNOSIS — S83242A Other tear of medial meniscus, current injury, left knee, initial encounter: Secondary | ICD-10-CM

## 2023-07-01 DIAGNOSIS — M25562 Pain in left knee: Secondary | ICD-10-CM

## 2023-07-01 DIAGNOSIS — Z01818 Encounter for other preprocedural examination: Secondary | ICD-10-CM | POA: Diagnosis not present

## 2023-07-01 DIAGNOSIS — G8929 Other chronic pain: Secondary | ICD-10-CM

## 2023-07-01 DIAGNOSIS — Z23 Encounter for immunization: Secondary | ICD-10-CM

## 2023-07-01 DIAGNOSIS — E291 Testicular hypofunction: Secondary | ICD-10-CM | POA: Diagnosis not present

## 2023-07-01 NOTE — Patient Instructions (Addendum)
Thank you for coming to the office today.  You are cleared for surgery. Pending labs today and urine  Flu Shot   We will fax the pre op report to Dr Shela Nevin and they can proceed   Please schedule a Follow-up Appointment to: Return if symptoms worsen or fail to improve.  If you have any other questions or concerns, please feel free to call the office or send a message through MyChart. You may also schedule an earlier appointment if necessary.  Additionally, you may be receiving a survey about your experience at our office within a few days to 1 week by e-mail or mail. We value your feedback.  Saralyn Pilar, DO [facility], [place]

## 2023-07-01 NOTE — Progress Notes (Unsigned)
Subjective:    Patient ID: Adam Cannon, male    DOB: [birth year], 57 y.o.   MRN: [id number]  Adam Cannon is a 57 y.o. male presenting on 07/01/2023 for Pre-op Exam   HPI  PRE-OPERATIVE MEDICAL CLEARANCE  Anticipated upcoming surgery - Left Knee Meniscotomy, by Dr Martha Clan (at [facility]) in approx 07/28/23  He has followed with Ortho and dx with tear in meniscus and failed conservative therapy has done MRI  - Today patient reports no new concerns. He is ready for surgery  Regarding surgical and anesthesia history: - Known history of minor surgeries in past, s/p wisdom tooth rremoval, and has tolerated general anesthesia well without problem, complication or allergy. No family history of problem with anesthesia - Able to tolerate regular exercise up to >4 METs, walking up flight of stairs - No known history of cardiovascular disease. Never had MI or known CAD. - No known history of pulmonary disease. Never smoked  - Denies exertional symptoms of chest pain or tightness, dyspnea, coughing, apnea, syncopal episodes, palpitations   Health Maintenance: Flu Shot today     07/02/2023   12:09 AM 07/02/2023   12:06 AM 04/16/2023   10:59 AM  Depression screen PHQ 2/9  Decreased Interest 0 0 0  Down, Depressed, Hopeless 0 0 0  PHQ - 2 Score 0 0 0  Altered sleeping   0  Tired, decreased energy   0  Change in appetite   0  Feeling bad or failure about yourself    0  Trouble concentrating   0  Moving slowly or fidgety/restless   0  Suicidal thoughts   0  PHQ-9 Score   0  Difficult doing work/chores   Not difficult at all   Past Surgical History:  Procedure Laterality Date   WISDOM TOOTH EXTRACTION Right 2018   general anesthesia     Social History   Tobacco Use   Smoking status: Never    Passive exposure: Never   Smokeless tobacco: Never  Vaping Use   Vaping status: Never Used  Substance Use Topics   Alcohol use: No   Drug use: Never     Review of Systems Per HPI unless specifically indicated above     Objective:    BP 112/76   Pulse 85   Ht 5' 10.5" (1.791 m)   Wt 199 lb (90.3 kg)   SpO2 97%   BMI 28.15 kg/m   Wt Readings from Last 3 Encounters:  07/01/23 199 lb (90.3 kg)  06/23/23 197 lb (89.4 kg)  05/18/23 197 lb (89.4 kg)    Physical Exam Vitals and nursing note reviewed.  Constitutional:      General: He is not in acute distress.    Appearance: Normal appearance. He is well-developed. He is not diaphoretic.     Comments: Well-appearing, comfortable, cooperative  HENT:     Head: Normocephalic and atraumatic.  Eyes:     General:        Right eye: No discharge.        Left eye: No discharge.     Conjunctiva/sclera: Conjunctivae normal.  Cardiovascular:     Rate and Rhythm: Normal rate.  Pulmonary:     Effort: Pulmonary effort is normal.  Skin:    General: Skin is warm and dry.     Findings: No erythema or rash.  Neurological:     Mental Status: He is alert and oriented to person, place,  and time.  Psychiatric:        Mood and Affect: Mood normal.        Behavior: Behavior normal.        Thought Content: Thought content normal.     Comments: Well groomed, good eye contact, normal speech and thoughts     EKG - performed in office today  Date: 07/01/23  Rate: 76  Rhythm: normal sinus rhythm  QRS Axis: normal  Intervals: normal  ST/T Wave abnormalities: normal  Conduction Disutrbances:none  Additional Narrative Interpretation: none  Old EKG Reviewed: changes noted last 10/16/20, sinus tachy, now rate is normal.   I have personally reviewed the radiology report from 04/16/23 on [facility].  MR Knee Left Wo Contrast [[id number]] Resulted: 04/16/23 1108  Order Status: Completed Updated: 04/16/23 1110  Narrative:    CLINICAL DATA:  Medial left knee pain and swelling for 6 weeks  EXAM: MRI OF THE LEFT KNEE WITHOUT CONTRAST  TECHNIQUE: Multiplanar, multisequence MR imaging of the knee  was performed. No intravenous contrast was administered.  COMPARISON:  X-ray 03/26/2023  FINDINGS: MENISCI  Medial meniscus: Complex tearing of the medial meniscus at the posterior horn-body junction with irregular radial component.  Lateral meniscus:  Intact.  LIGAMENTS  Cruciates: Intact ACL and PCL.  Collaterals: Intact MCL with periligamentous edema. Lateral collateral ligament complex intact.  CARTILAGE  Patellofemoral: Partial-thickness cartilage loss within the central trochlear groove. No patellar cartilage defect.  Medial: 5 mm partial-thickness cartilage defect of the central weight-bearing medial femoral condyle.  Lateral:  No chondral defect.  MISCELLANEOUS  Joint:  Small joint effusion. Fat pads within normal limits.  Popliteal Fossa: Moderate-sized Baker's cyst containing a loose body. Cyst is leaking inferiorly. Intact popliteus tendon.  Extensor Mechanism:  Intact quadriceps and patellar tendons.  Bones: No acute fracture. No dislocation. No bone marrow edema. No marrow replacing bone lesion.  Other: No significant periarticular soft tissue findings.  IMPRESSION: 1. Medial meniscal tear. 2. Grade 1 MCL sprain. 3. Mild medial and patellofemoral compartment osteoarthritis. 4. Small joint effusion. 5. Moderate-sized leaking Baker's cyst.   Electronically Signed   By: Duanne Guess D.O.   On: 04/16/2023 11:08   I have personally reviewed the radiology report from CXR 12/22/18  CLINICAL DATA:  Chest pain   EXAM: CHEST - 2 VIEW   COMPARISON:  07/04/2015   FINDINGS: The heart size and mediastinal contours are within normal limits. Both lungs are clear. The visualized skeletal structures are unremarkable.   IMPRESSION: No active cardiopulmonary disease.     Electronically Signed   By: Jasmine Pang M.D.   On: 12/22/2018 01:48  Results for orders placed or performed in visit on 06/01/23  Testosterone  Result Value Ref Range    Testosterone 139 (L) 250 - 827 ng/dL  Vitamin I69  Result Value Ref Range   Vitamin B-12 911 200 - 1,100 pg/mL  VITAMIN D 25 Hydroxy (Vit-D Deficiency, Fractures)  Result Value Ref Range   Vit D, 25-Hydroxy 38 30 - 100 ng/mL  T4, free  Result Value Ref Range   Free T4 1.6 0.8 - 1.8 ng/dL  TSH  Result Value Ref Range   TSH 1.20 0.40 - 4.50 mIU/L  Hemoglobin A1c  Result Value Ref Range   Hgb A1c MFr Bld 5.6 <5.7 % of total Hgb   Mean Plasma Glucose 114 mg/dL   eAG (mmol/L) 6.3 mmol/L      Assessment & Plan:   Problem List Items Addressed  This Visit   None Visit Diagnoses     Chronic pain of left knee    -  Primary   Need for influenza vaccination       Relevant Orders   Flu vaccine trivalent PF, 6mos and older(Flulaval,Afluria,Fluarix,Fluzone) (Completed)   Tear of medial meniscus of left knee, current, unspecified tear type, initial encounter       Pre-op evaluation       Relevant Orders   CBC with Differential/Platelet   Urinalysis, Routine w reflex microscopic   BASIC METABOLIC PANEL WITH GFR   Hypogonadism in male           Pre-op clearance for non-cardiac surgery today, Left Knee Meniscotomy laparoscopic for medial meniscus tear (Low risk), general anesthesia. - Previously tolerated prior surgeries with general anesthesia - No known cardiac hx. Appropriate functional status >4 METs - Never smoker  Plan: 1. Cleared for elective surgery pending labs.. Will complete provided pre-op paperwork per [facility] Dr Martha Clan office, anticipated 07/06/23 when back in office. 2. Will check BMET for Cr monitoring / CBC / Urinalysis 3. EKG today as requested.   No orders of the defined types were placed in this encounter.   Follow up plan: Return if symptoms worsen or fail to improve.   Saralyn Pilar, DO [facility] 07/01/2023, 2:00 PM

## 2023-07-02 ENCOUNTER — Encounter: Payer: Self-pay | Admitting: Family Medicine

## 2023-07-02 LAB — URINALYSIS, ROUTINE W REFLEX MICROSCOPIC
Bilirubin Urine: NEGATIVE
Glucose, UA: NEGATIVE
Hgb urine dipstick: NEGATIVE
Ketones, ur: NEGATIVE
Leukocytes,Ua: NEGATIVE
Nitrite: NEGATIVE
Protein, ur: NEGATIVE
Specific Gravity, Urine: 1.019 (ref 1.001–1.035)
pH: 6 (ref 5.0–8.0)

## 2023-07-02 LAB — BASIC METABOLIC PANEL WITH GFR
BUN: 14 mg/dL (ref 7–25)
CO2: 31 mmol/L (ref 20–32)
Calcium: 9.9 mg/dL (ref 8.6–10.3)
Chloride: 102 mmol/L (ref 98–110)
Creat: 1.19 mg/dL (ref 0.70–1.30)
Glucose, Bld: 66 mg/dL (ref 65–99)
Potassium: 4 mmol/L (ref 3.5–5.3)
Sodium: 140 mmol/L (ref 135–146)
eGFR: 72 mL/min/{1.73_m2} (ref >=60)

## 2023-07-02 LAB — CBC WITH DIFFERENTIAL/PLATELET
Absolute Monocytes: 511 {cells}/uL (ref 200–950)
Basophils Absolute: 57 {cells}/uL (ref 0–200)
Basophils Relative: 0.8 %
Eosinophils Absolute: 206 {cells}/uL (ref 15–500)
Eosinophils Relative: 2.9 %
HCT: 48.6 % (ref 38.5–50.0)
Hemoglobin: 16.3 g/dL (ref 13.2–17.1)
Lymphs Abs: 1569 {cells}/uL (ref 850–3900)
MCH: 29.1 pg (ref 27.0–33.0)
MCHC: 33.5 g/dL (ref 32.0–36.0)
MCV: 86.8 fL (ref 80.0–100.0)
MPV: 10.1 fL (ref 7.5–12.5)
Monocytes Relative: 7.2 %
Neutro Abs: 4757 {cells}/uL (ref 1500–7800)
Neutrophils Relative %: 67 %
Platelets: 300 10*3/uL (ref 140–400)
RBC: 5.6 10*6/uL (ref 4.20–5.80)
RDW: 13.6 % (ref 11.0–15.0)
Total Lymphocyte: 22.1 %
WBC: 7.1 10*3/uL (ref 3.8–10.8)

## 2023-07-15 ENCOUNTER — Other Ambulatory Visit
Admission: RE | Admit: 2023-07-15 | Discharge: 2023-07-15 | Disposition: A | Discharge status: Home / Self Care | Payer: MEDICAID | Attending: Urology | Admitting: Urology

## 2023-07-15 ENCOUNTER — Other Ambulatory Visit: Payer: Self-pay | Admitting: *Deleted

## 2023-07-15 ENCOUNTER — Other Ambulatory Visit: Payer: Self-pay

## 2023-07-15 DIAGNOSIS — E291 Testicular hypofunction: Secondary | ICD-10-CM | POA: Diagnosis present

## 2023-07-15 LAB — HEMOGLOBIN AND HEMATOCRIT, BLOOD
HCT: 46.7 % (ref 39.0–52.0)
Hemoglobin: 16.3 g/dL (ref 13.0–17.0)

## 2023-07-16 LAB — LUTEINIZING HORMONE: LH: 11.1 m[IU]/mL — ABNORMAL HIGH (ref 1.7–8.6)

## 2023-07-16 LAB — TESTOSTERONE: Testosterone: 354 ng/dL (ref 264–916)

## 2023-07-24 ENCOUNTER — Other Ambulatory Visit: Payer: Self-pay | Admitting: Urology

## 2023-07-24 ENCOUNTER — Other Ambulatory Visit: Payer: Self-pay | Admitting: Family Medicine

## 2023-07-24 DIAGNOSIS — Z1212 Encounter for screening for malignant neoplasm of rectum: Secondary | ICD-10-CM

## 2023-07-24 DIAGNOSIS — Z1211 Encounter for screening for malignant neoplasm of colon: Secondary | ICD-10-CM

## 2023-07-24 DIAGNOSIS — R7989 Other specified abnormal findings of blood chemistry: Secondary | ICD-10-CM

## 2023-07-24 NOTE — Progress Notes (Unsigned)
07/27/2023 9:59 AM   Adam Cannon [birth year] [id number]  Referring provider: Smitty Cords, DO [address] [place],  [place] [address]  Urological history: 1. Prostate cancer screening -PSA (02/2022) 0.44  2. Hypogonadism -am testosterone level (05/2023) 139 -am testosterone level (06/2023) 354 -LH (06/2023) 11.1  Chief Complaint  Patient presents with   Hypogonadism   HPI: Adam Cannon is a 57 y.o. male who presents today for to review blood work.    Previous records reviewed.   Patient was found to have a low testosterone on 06/01/2023 associated with symptoms of decreased energy, apathy and fatigue.   A repeat testosterone one month later was normal.  His TSH, CBC, BMP, vitamin B12 and D were all normal.    He continues to have the symptoms of decreased energy, apathy and fatigue.  Patient denies any modifying or aggravating factors.  Patient denies any recent UTI's, gross hematuria, dysuria or suprapubic/flank pain.  Patient denies any fevers, chills, nausea or vomiting.     PMH: Past Medical History:  Diagnosis Date   Environmental and seasonal allergies     Surgical History: Past Surgical History:  Procedure Laterality Date   WISDOM TOOTH EXTRACTION Right 2018   general anesthesia    Home Medications:  Allergies as of 07/27/2023       Reactions   Mucinex [guaifenesin Er] Nausea And Vomiting   Allergic to Liquid form only per patient on 12/15/2019 call        Medication List        Accurate as of July 27, 2023  9:59 AM. If you have any questions, ask your nurse or doctor.          baclofen 10 MG tablet Commonly known as: LIORESAL Take 0.5-1 tablets (5-10 mg total) by mouth 3 (three) times daily as needed for muscle spasms.   buPROPion 300 MG 24 hr tablet Commonly known as: WELLBUTRIN XL Take 1 tablet (300 mg total) by mouth every morning.   celecoxib 200 MG capsule Commonly known as: CELEBREX Take 200 mg by mouth daily.    gabapentin 100 MG capsule Commonly known as: NEURONTIN Take gabapentin 200 mg twice daily for 1 week, then increase to 300 mg twice daily and continue this dose.   hydrOXYzine 25 MG tablet Commonly known as: ATARAX Take 1-2 tablets (25-50 mg total) by mouth at bedtime as needed.   ipratropium 0.06 % nasal spray Commonly known as: ATROVENT Place 2 sprays into both nostrils 4 (four) times daily as needed for rhinitis.   loratadine 10 MG tablet Commonly known as: CLARITIN Take 1 tablet (10 mg total) by mouth daily.   naproxen 500 MG tablet Commonly known as: NAPROSYN TAKE 1 TABLET BY MOUTH TWICE DAILY WITH MEALS FOR  2  TO  4  WEEKS,  THEN  AS  NEEDED   omeprazole 40 MG capsule Commonly known as: PRILOSEC Take 1 capsule (40 mg total) by mouth daily before breakfast.   ondansetron 4 MG disintegrating tablet Commonly known as: ZOFRAN-ODT Take 1 tablet (4 mg total) by mouth every 8 (eight) hours as needed for nausea or vomiting.   PARoxetine 20 MG tablet Commonly known as: PAXIL Take 1 tablet (20 mg total) by mouth at bedtime.   tamsulosin 0.4 MG Caps capsule Commonly known as: FLOMAX Take 1 capsule (0.4 mg total) by mouth daily after supper.   thiothixene 10 MG capsule Commonly known as: NAVANE Take 1 capsule (10 mg total)  by mouth 2 (two) times daily.   traZODone 100 MG tablet Commonly known as: DESYREL Take 0.5 tablets (50 mg total) by mouth at bedtime.        Allergies:  Allergies  Allergen Reactions   Mucinex [Guaifenesin Er] Nausea And Vomiting    Allergic to Liquid form only per patient on 12/15/2019 call    Family History: Family History  Problem Relation Age of Onset   Hypertension Mother    Breast cancer Mother    Hypertension Father    Healthy Sister    Healthy Brother    Hypertension Maternal Uncle    Heart attack Maternal Uncle    Prostate cancer Neg Hx     Social History:  reports that he has never smoked. He has never been exposed to  tobacco smoke. He has never used smokeless tobacco. He reports that he does not drink alcohol and does not use drugs.  ROS: Pertinent ROS in HPI  Physical Exam: BP 119/77 (BP Location: Left Arm, Patient Position: Sitting, Cuff Size: Large)   Pulse 80   Ht 5' 10.5" (1.791 m)   Wt 202 lb 9.6 oz (91.9 kg)   BMI 28.66 kg/m   Constitutional:  Well nourished. Alert and oriented, No acute distress. HEENT: [place] AT, moist mucus membranes.  Trachea midline Cardiovascular: No clubbing, cyanosis, or edema. Respiratory: Normal respiratory effort, no increased work of breathing. Neurologic: Grossly intact, no focal deficits, moving all 4 extremities. Psychiatric: Normal mood and affect.  Laboratory Data: Lab Results  Component Value Date   WBC 7.1 07/02/2023   HGB 16.3 07/15/2023   HCT 46.7 07/15/2023   MCV 86.8 07/02/2023   PLT 300 07/02/2023    Lab Results  Component Value Date   CREATININE 1.19 07/02/2023    Lab Results  Component Value Date   PSA 0.44 02/24/2022    Lab Results  Component Value Date   TESTOSTERONE 354 07/15/2023    Lab Results  Component Value Date   HGBA1C 5.6 06/01/2023    Lab Results  Component Value Date   TSH 1.20 06/01/2023       Component Value Date/Time   CHOL 214 (H) 11/06/2022 0654   HDL 75 11/06/2022 0654   CHOLHDL 2.9 11/06/2022 0654   VLDL 13 11/06/2022 0654   LDLCALC 126 (H) 11/06/2022 0654   LDLCALC 162 (H) 01/17/2022 0900    Lab Results  Component Value Date   AST 27 11/05/2022   Lab Results  Component Value Date   ALT 32 11/05/2022    Urinalysis    Component Value Date/Time   COLORURINE YELLOW 07/02/2023 0434   APPEARANCEUR CLEAR 07/02/2023 0434   APPEARANCEUR Hazy (A) 01/28/2023 1459   LABSPEC 1.019 07/02/2023 0434   PHURINE 6.0 07/02/2023 0434   GLUCOSEU NEGATIVE 07/02/2023 0434   HGBUR NEGATIVE 07/02/2023 0434   BILIRUBINUR Negative 01/28/2023 1459   KETONESUR NEGATIVE 07/02/2023 0434   PROTEINUR NEGATIVE  07/02/2023 0434   NITRITE NEGATIVE 07/02/2023 0434   LEUKOCYTESUR NEGATIVE 07/02/2023 0434  I have reviewed the labs.   Pertinent Imaging: N/A  Assessment & Plan:    1. Low testosterone/Elevated LH -repeat am testosterone level with free and total testosterone -explained that sometimes there can be a transient state of hypogonadism -there is some evidence that an elevated LH with a normal testosterone can hearald the development of erectile dysfunction, poor health, cardiovascular disease (CVD) and cancers -encouraged healthy lifestyle choices -discussed that if his blood work from today indicates  hypogonadism, I would recommend Clomid since it would utilize his body's own pathways to make testosterone -explained that TRT is not a treatment for ED, he may see some improvement in his erections, but his ED will likely persist even with therapeutic levels of testosterone -Significant symptoms -Potential side effects of testosterone replacement were discussed including stimulation of benign prostatic growth with lower urinary tract symptoms; erythrocytosis; edema; gynecomastia; worsening sleep apnea; venous thromboembolism; testicular atrophy and infertility. Recent studies suggesting an increased incidence of heart attack and stroke in patients taking testosterone was discussed. He was informed there is conflicting evidence regarding the impact of testosterone therapy on cardiovascular risk. The theoretical risk of growth stimulation of an undetected prostate cancer was also discussed.  He was informed that current evidence does not provide any definitive answers regarding the risks of testosterone therapy on prostate cancer and cardiovascular disease. The need for periodic monitoring of his testosterone level, PSA, hematocrit and DRE was discussed.   2. Prostate cancer screening  -screening up to date  Return for pending free and total testosterone levels .  These notes generated with voice  recognition software. I apologize for typographical errors.  Cloretta Ned  [facility] [address]  [address] [place], [place] [address] [phone]

## 2023-07-27 ENCOUNTER — Other Ambulatory Visit
Admission: RE | Admit: 2023-07-27 | Discharge: 2023-07-27 | Disposition: A | Discharge status: Home / Self Care | Payer: MEDICAID | Attending: Urology | Admitting: Urology

## 2023-07-27 ENCOUNTER — Ambulatory Visit (INDEPENDENT_AMBULATORY_CARE_PROVIDER_SITE_OTHER): Payer: MEDICAID | Admitting: Urology

## 2023-07-27 ENCOUNTER — Encounter: Payer: Self-pay | Admitting: Urology

## 2023-07-27 VITALS — BP 119/77 | HR 80 | Ht 70.5 in | Wt 202.6 lb

## 2023-07-27 DIAGNOSIS — R7989 Other specified abnormal findings of blood chemistry: Secondary | ICD-10-CM | POA: Insufficient documentation

## 2023-07-27 DIAGNOSIS — Z125 Encounter for screening for malignant neoplasm of prostate: Secondary | ICD-10-CM

## 2023-07-30 LAB — TESTOSTERONE,FREE AND TOTAL
Testosterone, Free: 20.5 pg/mL (ref 7.2–24.0)
Testosterone: 430 ng/dL (ref 264–916)

## 2023-08-17 ENCOUNTER — Other Ambulatory Visit: Payer: Self-pay | Admitting: Internal Medicine

## 2023-08-17 DIAGNOSIS — K219 Gastro-esophageal reflux disease without esophagitis: Secondary | ICD-10-CM

## 2023-08-17 DIAGNOSIS — R110 Nausea: Secondary | ICD-10-CM

## 2023-08-18 NOTE — Telephone Encounter (Signed)
Requested medication (s) are due for refill today: Yes  Requested medication (s) are on the active medication list: Yes  Last refill:  05/26/23  Future visit scheduled: Yes  Notes to clinic:  Not delegated.    Requested Prescriptions  Pending Prescriptions Disp Refills   ondansetron (ZOFRAN-ODT) 4 MG disintegrating tablet [Pharmacy Med Name: Ondansetron 4 MG Oral Tablet Disintegrating] 20 tablet 0    Sig: DISSOLVE 1 TABLET IN MOUTH EVERY 8 HOURS AS NEEDED FOR NAUSEA FOR VOMITING     Not Delegated - Gastroenterology: Antiemetics - ondansetron Failed - 08/17/2023  3:32 PM      Failed - This refill cannot be delegated      Passed - AST in normal range and within 360 days    AST  Date Value Ref Range Status  11/05/2022 27 15 - 41 U/L Final   SGOT(AST)  Date Value Ref Range Status  02/19/2012 26 15 - 37 Unit/L Final         Passed - ALT in normal range and within 360 days    ALT  Date Value Ref Range Status  11/05/2022 32 0 - 44 U/L Final   SGPT (ALT)  Date Value Ref Range Status  02/19/2012 34 U/L Final    Comment:    12-78 NOTE: NEW REFERENCE RANGE 09/12/2011          Passed - Valid encounter within last 6 months    Recent Outpatient Visits           1 month ago Chronic pain of left knee   [facility] Smitty Cords, DO   3 months ago Gastroesophageal reflux disease without esophagitis   [place] [facility] [place], Netta Neat, DO   4 months ago Tear of medial meniscus of left knee, current, unspecified tear type, initial encounter   [facility], Netta Neat, DO   4 months ago Left medial knee pain   [facility] Smitty Cords, DO   7 months ago Urethral discharge in male   [facility] [place], Netta Neat, DO       Future Appointments             In 1 week Althea Charon, Netta Neat, DO  [facility]

## 2023-08-31 ENCOUNTER — Ambulatory Visit (INDEPENDENT_AMBULATORY_CARE_PROVIDER_SITE_OTHER): Payer: MEDICAID | Admitting: Family Medicine

## 2023-08-31 ENCOUNTER — Encounter: Payer: Self-pay | Admitting: Family Medicine

## 2023-08-31 VITALS — BP 120/80 | Ht 70.5 in | Wt 200.0 lb

## 2023-08-31 DIAGNOSIS — R7303 Prediabetes: Secondary | ICD-10-CM

## 2023-08-31 DIAGNOSIS — E7800 Pure hypercholesterolemia, unspecified: Secondary | ICD-10-CM | POA: Diagnosis not present

## 2023-08-31 DIAGNOSIS — E663 Overweight: Secondary | ICD-10-CM

## 2023-08-31 DIAGNOSIS — F200 Paranoid schizophrenia: Secondary | ICD-10-CM

## 2023-08-31 DIAGNOSIS — Z0000 Encounter for general adult medical examination without abnormal findings: Secondary | ICD-10-CM

## 2023-08-31 NOTE — Progress Notes (Signed)
Subjective:    Patient ID: Adam Cannon, male    DOB: 03/26/1966, 57 y.o.   MRN: [id number]  Adam Cannon is a 57 y.o. male presenting on 08/31/2023 for Annual Exam, Results (Low testosterone, saw urology, still feeling tired), and Obesity (Would like to be around 190lb-188lb)   HPI  Here for Annual Physical and Lab Orders  Elevated A1c Last lab resulted 06/01/23  5.6 improved   GERD Controlled on PPI Omeprazole 40mg    Followed by Orthopedics for Knees  Insomnia Poor Sleep Low Energy / Fatigue Excessive Daytime Sleepiness  On medication management per Psychiatry - Dr Georjean Mode  History of Low T - prior evaluation with lab initially mild low then repeat at Urology normalized. No further treatment per Urology at this time.  He is often in bed 11-12am Wakes up unprompted by 3am Tries to fall back to sleep sometimes difficulty, may take 1-1.5 hr to fall back asleep Has to wake up by 730am He has Trazodone but says it is too much and not feeling rested. Stopped taking this.  Epworth Sleepiness Scale Total Score: 12 Sitting and reading - 3 Watching TV - 1 Sitting inactive in a public place - 1 As a passenger in a car for an hour without a break - 2 Lying down to rest in the afternoon when circumstances permit - 3 Sitting and talking to someone - 0 Sitting quietly after a lunch without alcohol - 2 In a car, while stopped for a few minutes in traffic - 0   STOP-Bang OSA scoring Snoring yes   Tiredness yes   Observed apneas no   Pressure HTN no   BMI > 35 kg/m2 no   Age > 50  yes   Neck (male >17 in; Male >16 in)  no   Gender male yes   OSA risk low (0-2)  OSA risk intermediate (3-4)  OSA risk high (5+)  Total: 4 int risk    Health Maintenance:  Cologuard kit at home.      08/31/2023    9:33 AM 07/02/2023   12:09 AM 07/02/2023   12:06 AM  Depression screen PHQ 2/9  Decreased Interest 0 0 0  Down, Depressed, Hopeless 0 0 0  PHQ - 2 Score 0 0 0  Altered  sleeping 0    Tired, decreased energy 2    Change in appetite 0    Feeling bad or failure about yourself  0    Trouble concentrating 0    Moving slowly or fidgety/restless 0    Suicidal thoughts 0    PHQ-9 Score 2        08/31/2023    9:33 AM 04/16/2023   10:59 AM 03/26/2023    8:40 AM 01/17/2022    8:58 AM  GAD 7 : Generalized Anxiety Score  Nervous, Anxious, on Edge 0 0 0 0  Control/stop worrying 0 0 0 0  Worry too much - different things 0 0 0 0  Trouble relaxing 0 0 0 0  Restless 0 0 0 0  Easily annoyed or irritable 0 0 0 0  Afraid - awful might happen 0 0 0 0  Total GAD 7 Score 0 0 0 0  Anxiety Difficulty  Not difficult at all Not difficult at all Not difficult at all      Past Medical History:  Diagnosis Date   Environmental and seasonal allergies    Past Surgical History:  Procedure Laterality Date   WISDOM  TOOTH EXTRACTION Right 2018   general anesthesia   Social History   Socioeconomic History   Marital status: Single    Spouse name: Not on file   Number of children: Not on file   Years of education: Not on file   Highest education level: Associate degree: occupational, Scientist, product/process development, or vocational program  Occupational History   Not on file  Tobacco Use   Smoking status: Never    Passive exposure: Never   Smokeless tobacco: Never  Vaping Use   Vaping status: Never Used  Substance and Sexual Activity   Alcohol use: No   Drug use: Never   Sexual activity: Yes  Other Topics Concern   Not on file  Social History Narrative   Not on file   Social Determinants of Health   Financial Resource Strain: Low Risk  (04/12/2023)   Overall Financial Resource Strain (CARDIA)    Difficulty of Paying Living Expenses: Not very hard  Food Insecurity: No Food Insecurity (04/12/2023)   Hunger Vital Sign    Worried About Running Out of Food in the Last Year: Never true    Ran Out of Food in the Last Year: Never true  Transportation Needs: No Transportation Needs  (04/12/2023)   PRAPARE - Administrator, Civil Service (Medical): No    Lack of Transportation (Non-Medical): No  Physical Activity: Sufficiently Active (04/12/2023)   Exercise Vital Sign    Days of Exercise per Week: 3 days    Minutes of Exercise per Session: 60 min  Stress: No Stress Concern Present (04/12/2023)   [employer] of Occupational Health - Occupational Stress Questionnaire    Feeling of Stress : Only a little  Social Connections: Moderately Isolated (04/12/2023)   Social Connection and Isolation Panel [NHANES]    Frequency of Communication with Friends and Family: Twice a week    Frequency of Social Gatherings with Friends and Family: Once a week    Attends Religious Services: 1 to 4 times per year    Active Member of [employer] or Organizations: No    Attends Engineer, structural: Not on file    Marital Status: Never married  Intimate Partner Violence: Not At Risk (11/05/2022)   Humiliation, Afraid, Rape, and Kick questionnaire    Fear of Current or Ex-Partner: No    Emotionally Abused: No    Physically Abused: No    Sexually Abused: No   Family History  Problem Relation Age of Onset   Hypertension Mother    Breast cancer Mother    Hypertension Father    Healthy Sister    Healthy Brother    Hypertension Maternal Uncle    Heart attack Maternal Uncle    Prostate cancer Neg Hx    Current Outpatient Medications on File Prior to Visit  Medication Sig   baclofen (LIORESAL) 10 MG tablet Take 0.5-1 tablets (5-10 mg total) by mouth 3 (three) times daily as needed for muscle spasms.   buPROPion (WELLBUTRIN XL) 300 MG 24 hr tablet Take 1 tablet (300 mg total) by mouth every morning.   celecoxib (CELEBREX) 200 MG capsule Take 200 mg by mouth daily.   gabapentin (NEURONTIN) 100 MG capsule Take gabapentin 200 mg twice daily for 1 week, then increase to 300 mg twice daily and continue this dose.   hydrOXYzine (ATARAX) 25 MG tablet Take 1-2 tablets (25-50 mg  total) by mouth at bedtime as needed.   ipratropium (ATROVENT) 0.06 % nasal spray Place 2 sprays  into both nostrils 4 (four) times daily as needed for rhinitis.   loratadine (CLARITIN) 10 MG tablet Take 1 tablet (10 mg total) by mouth daily.   naproxen (NAPROSYN) 500 MG tablet TAKE 1 TABLET BY MOUTH TWICE DAILY WITH MEALS FOR  2  TO  4  WEEKS,  THEN  AS  NEEDED   omeprazole (PRILOSEC) 40 MG capsule Take 1 capsule (40 mg total) by mouth daily before breakfast.   ondansetron (ZOFRAN-ODT) 4 MG disintegrating tablet DISSOLVE 1 TABLET IN MOUTH EVERY 8 HOURS AS NEEDED FOR NAUSEA FOR VOMITING   PARoxetine (PAXIL) 20 MG tablet Take 1 tablet (20 mg total) by mouth at bedtime.   tamsulosin (FLOMAX) 0.4 MG CAPS capsule Take 1 capsule (0.4 mg total) by mouth daily after supper.   thiothixene (NAVANE) 10 MG capsule Take 1 capsule (10 mg total) by mouth 2 (two) times daily.   No current facility-administered medications on file prior to visit.    Review of Systems  Constitutional:  Negative for activity change, appetite change, chills, diaphoresis, fatigue and fever.  HENT:  Negative for congestion and hearing loss.   Eyes:  Negative for visual disturbance.  Respiratory:  Negative for cough, chest tightness, shortness of breath and wheezing.   Cardiovascular:  Negative for chest pain, palpitations and leg swelling.  Gastrointestinal:  Negative for abdominal pain, constipation, diarrhea, nausea and vomiting.  Genitourinary:  Negative for dysuria, frequency and hematuria.  Musculoskeletal:  Negative for arthralgias and neck pain.  Skin:  Negative for rash.  Neurological:  Negative for dizziness, weakness, light-headedness, numbness and headaches.  Hematological:  Negative for adenopathy.  Psychiatric/Behavioral:  Negative for behavioral problems, dysphoric mood and sleep disturbance.    Per HPI unless specifically indicated above      Objective:    BP 120/80   Ht 5' 10.5" (1.791 m)   Wt 200 lb  (90.7 kg)   BMI 28.29 kg/m   Wt Readings from Last 3 Encounters:  08/31/23 200 lb (90.7 kg)  07/27/23 202 lb 9.6 oz (91.9 kg)  07/01/23 199 lb (90.3 kg)    Physical Exam Vitals and nursing note reviewed.  Constitutional:      General: He is not in acute distress.    Appearance: He is well-developed. He is not diaphoretic.     Comments: Well-appearing, comfortable, cooperative  HENT:     Head: Normocephalic and atraumatic.  Eyes:     General:        Right eye: No discharge.        Left eye: No discharge.     Conjunctiva/sclera: Conjunctivae normal.     Pupils: Pupils are equal, round, and reactive to light.  Neck:     Thyroid: No thyromegaly.     Vascular: No carotid bruit.  Cardiovascular:     Rate and Rhythm: Normal rate and regular rhythm.     Pulses: Normal pulses.     Heart sounds: Normal heart sounds. No murmur heard. Pulmonary:     Effort: Pulmonary effort is normal. No respiratory distress.     Breath sounds: Normal breath sounds. No wheezing or rales.  Abdominal:     General: Bowel sounds are normal. There is no distension.     Palpations: Abdomen is soft. There is no mass.     Tenderness: There is no abdominal tenderness.  Musculoskeletal:        General: No tenderness. Normal range of motion.     Cervical back: Normal range of motion  and neck supple.     Right lower leg: No edema.     Left lower leg: No edema.     Comments: Upper / Lower Extremities: - Normal muscle tone, strength bilateral upper extremities 5/5, lower extremities 5/5  Lymphadenopathy:     Cervical: No cervical adenopathy.  Skin:    General: Skin is warm and dry.     Findings: No erythema or rash.  Neurological:     Mental Status: He is alert and oriented to person, place, and time.     Comments: Distal sensation intact to light touch all extremities  Psychiatric:        Mood and Affect: Mood normal.        Behavior: Behavior normal.        Thought Content: Thought content normal.      Comments: Well groomed, good eye contact, normal speech and thoughts       Results for orders placed or performed during the hospital encounter of 07/27/23  Testosterone,Free and Total  Result Value Ref Range   Testosterone 430 264 - 916 ng/dL   Testosterone, Free 16.1 7.2 - 24.0 pg/mL      Assessment & Plan:   Problem List Items Addressed This Visit     Overweight (BMI 25.0-29.9)   Paranoid schizophrenia (HCC)   Pre-diabetes   Pure hypercholesterolemia   Relevant Orders   Lipid panel   CT CARDIAC SCORING (SELF PAY ONLY)   Other Visit Diagnoses     Annual physical exam    -  Primary       Updated Health Maintenance information Fasting lab Lipid panel today Encouraged improvement to lifestyle with diet and exercise Goal of weight loss   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 Unlikely to fully explain fatigue low energy Discussed the risks/benefits of testosterone supplementation and the need for consistent low levels to meet criteria for treatment. Not on therapy at this time. -Monitor testosterone levels and symptoms.  --------------------------- Sleep Disturbance Reports waking up around 3 am and difficulty returning to sleep. Possible restless leg syndrome or sleep apnea. Concern for suspected obstructive sleep apnea given reported symptoms without witnessed apnea. But with snoring and sleep disturbance, fatigue excessive sleepiness. - Screening: ESS score 12 / STOP-Bang Score 4 int risk - Neck Circumference: < 17" - Co-morbidities: Mood Disorder  Plan: 1. Discussion on initial diagnosis and testing for OSA, risk factors, management, complications 2. Agree to proceed with sleep study testing based on clinical concerns - will fax referral to [facility] - to arrange initial PSG home vs in sleep lab and further evaluation. -----------------------  Overweight BMI  of 28, in the overweight category.  Discussed the benefits of weight loss for overall health and energy levels. -Encouraged to explore local weight loss programs and consider dietary changes and exercise regimen.  Hyperlipidemia -Order cholesterol panel today.  General Health Maintenance -Order colon cancer screening kit. -Order heart CT scan for cardiovascular risk assessment. -Schedule follow-up appointment in 6 months.      Orders Placed This Encounter  Procedures   CT CARDIAC SCORING (SELF PAY ONLY)    Standing Status:   Future    Standing Expiration Date:   08/30/2024    Order Specific Question:   Preferred imaging location?    Answer:   [facility]   Lipid panel    Order Specific Question:   Has the patient fasted?  Answer:   Yes     No orders of the defined types were placed in this encounter.     Follow up plan: Return in about 6 months (around 02/28/2024) for 6 month follow-up, weight, energy, insomia, updates.  Saralyn Pilar, DO [facility] 08/31/2023, 8:57 AM

## 2023-08-31 NOTE — Patient Instructions (Addendum)
Thank you for coming to the office today.   You have been referred for a Coronary Calcium Score Cardiac CT Scan. This is a screening test for patients aged 56-50+ with cardiovascular risk factors or who are healthy but would be interested in Cardiovascular Screening for heart disease. Even if there is a family history of heart disease, this imaging can be useful. Typically it can be done every 5+ years or at a different timeline we agree on  The scan will look at the chest and mainly focus on the heart and identify early signs of calcium build up or blockages within the heart arteries. It is not 100% accurate for identifying blockages or heart disease, but it is useful to help [place] predict who may have some early changes or be at risk in the future for a heart attack or cardiovascular problem.  The results are reviewed by a Cardiologist and they will document the results. It should become available on MyChart. Typically the results are divided into percentiles based on other patients of the same demographic and age. So it will compare your risk to others similar to you. If you have a higher score >99 or higher percentile >75%tile, it is recommended to consider Statin cholesterol therapy and or referral to Cardiologist. I will try to help explain your results and if we have questions we can contact the Cardiologist.  You will be contacted for scheduling. Usually it is done at any imaging facility through [facility], [facility] or [facility].  The cost is $99 flat fee total and it does not go through insurance, so no authorization is required.   [facility] Address: [address] [place], [place], [place] [address] Phone: [phone]  Referral sent via fax today. Stay tuned to hear back from them, they should call you to schedule initial sleep study, likely a home test and then if abnormal - they will arrange the 2nd part of the sleep study in the  lab with the CPAP machine.  If there is any issue with this company, for example not covered by insurance or other problem, please notify us and they may do so a well - we will need to change the location of the referral.  I will go ahead and place the ordereck with your insurance or this location for coverage on insurance for "In Lab Sleep Study" Polysomnography.  Or can check into [facility] for an At Home Sleep Study  --------------------------  WEIGHT MANAGEMENT  Dr Quillian Quince  [facility] [address] [place], [place] [address] Ph: [phone]  ---------------------------------------------------------------  [facility] [place] Address: [address] [place], [place] [address] Open 24 hours Phone: [phone]  Personal Trainer, Diet Program, 6 week course  [email or web address]  Weight Watchers  MyFitnessPal   Heart-Healthy Eating Plan Many factors influence your heart health, including eating and exercise habits. Heart health is also called coronary health. Coronary risk increases with abnormal blood fat (lipid) levels. A heart-healthy eating plan includes limiting unhealthy fats, increasing healthy fats, limiting salt (sodium) intake, and making other diet and lifestyle changes. What is my plan? Your health care provider may recommend that: You limit your fat intake to _________% or less of your total calories each day. You limit your saturated fat intake to _________% or less of your total calories each day. You limit the amount of cholesterol in your diet to less than _________ mg  per day. You limit the amount of sodium in your diet to less than _________ mg per day. What are tips for following this plan? Cooking Cook foods using methods other than frying. Baking, boiling, grilling, and broiling are all good options. Other ways to reduce fat include: Removing the skin from poultry. Removing all visible  fats from meats. Steaming vegetables in water or broth. Meal planning  At meals, imagine dividing your plate into fourths: Fill one-half of your plate with vegetables and green salads. Fill one-fourth of your plate with whole grains. Fill one-fourth of your plate with lean protein foods. Eat 2-4 cups of vegetables per day. One cup of vegetables equals 1 cup (91 g) broccoli or cauliflower florets, 2 medium carrots, 1 large bell pepper, 1 large sweet potato, 1 large tomato, 1 medium white potato, 2 cups (150 g) raw leafy greens. Eat 1-2 cups of fruit per day. One cup of fruit equals 1 small apple, 1 large banana, 1 cup (237 g) mixed fruit, 1 large orange,  cup (82 g) dried fruit, 1 cup (240 mL) 100% fruit juice. Eat more foods that contain soluble fiber. Examples include apples, broccoli, carrots, beans, peas, and barley. Aim to get 25-30 g of fiber per day. Increase your consumption of legumes, nuts, and seeds to 4-5 servings per week. One serving of dried beans or legumes equals  cup (90 g) cooked, 1 serving of nuts is  oz (12 almonds, 24 pistachios, or 7 walnut halves), and 1 serving of seeds equals  oz (8 g). Fats Choose healthy fats more often. Choose monounsaturated and polyunsaturated fats, such as olive and canola oils, avocado oil, flaxseeds, walnuts, almonds, and seeds. Eat more omega-3 fats. Choose salmon, mackerel, sardines, tuna, flaxseed oil, and ground flaxseeds. Aim to eat fish at least 2 times each week. Check food labels carefully to identify foods with trans fats or high amounts of saturated fat. Limit saturated fats. These are found in animal products, such as meats, butter, and cream. Plant sources of saturated fats include palm oil, palm kernel oil, and coconut oil. Avoid foods with partially hydrogenated oils in them. These contain trans fats. Examples are stick margarine, some tub margarines, cookies, crackers, and other baked goods. Avoid fried foods. General  information Eat more home-cooked food and less restaurant, buffet, and fast food. Limit or avoid alcohol. Limit foods that are high in added sugar and simple starches such as foods made using white refined flour (white breads, pastries, sweets). Lose weight if you are overweight. Losing just 5-10% of your body weight can help your overall health and prevent diseases such as diabetes and heart disease. Monitor your sodium intake, especially if you have high blood pressure. Talk with your health care provider about your sodium intake. Try to incorporate more vegetarian meals weekly. What foods should I eat? Fruits All fresh, canned (in natural juice), or frozen fruits. Vegetables Fresh or frozen vegetables (raw, steamed, roasted, or grilled). Green salads. Grains Most grains. Choose whole wheat and whole grains most of the time. Rice and pasta, including brown rice and pastas made with whole wheat. Meats and other proteins Lean, well-trimmed beef, veal, pork, and lamb. Chicken and [place] without skin. All fish and shellfish. Wild duck, rabbit, pheasant, and venison. Egg whites or low-cholesterol egg substitutes. Dried beans, peas, lentils, and tofu. Seeds and most nuts. Dairy Low-fat or nonfat cheeses, including ricotta and mozzarella. Skim or 1% milk (liquid, powdered, or evaporated). Buttermilk made with low-fat milk. Nonfat or  low-fat yogurt. Fats and oils Non-hydrogenated (trans-free) margarines. Vegetable oils, including soybean, sesame, sunflower, olive, avocado, peanut, safflower, corn, canola, and cottonseed. Salad dressings or mayonnaise made with a vegetable oil. Beverages Water (mineral or sparkling). Coffee and tea. Unsweetened ice tea. Diet beverages. Sweets and desserts Sherbet, gelatin, and fruit ice. Small amounts of dark chocolate. Limit all sweets and desserts. Seasonings and condiments All seasonings and condiments. The items listed above may not be a complete list of  foods and beverages you can eat. Contact a dietitian for more options. What foods should I avoid? Fruits Canned fruit in heavy syrup. Fruit in cream or butter sauce. Fried fruit. Limit coconut. Vegetables Vegetables cooked in cheese, cream, or butter sauce. Fried vegetables. Grains Breads made with saturated or trans fats, oils, or whole milk. Croissants. Sweet rolls. Donuts. High-fat crackers, such as cheese crackers and chips. Meats and other proteins Fatty meats, such as hot dogs, ribs, sausage, bacon, rib-eye roast or steak. High-fat deli meats, such as salami and bologna. Caviar. Domestic duck and goose. Organ meats, such as liver. Dairy Cream, sour cream, cream cheese, and creamed cottage cheese. Whole-milk cheeses. Whole or 2% milk (liquid, evaporated, or condensed). Whole buttermilk. Cream sauce or high-fat cheese sauce. Whole-milk yogurt. Fats and oils Meat fat, or shortening. Cocoa butter, hydrogenated oils, palm oil, coconut oil, palm kernel oil. Solid fats and shortenings, including bacon fat, salt pork, lard, and butter. Nondairy cream substitutes. Salad dressings with cheese or sour cream. Beverages Regular sodas and any drinks with added sugar. Sweets and desserts Frosting. Pudding. Cookies. Cakes. Pies. Milk chocolate or white chocolate. Buttered syrups. Full-fat ice cream or ice cream drinks. The items listed above may not be a complete list of foods and beverages to avoid. Contact a dietitian for more information. Summary Heart-healthy meal planning includes limiting unhealthy fats, increasing healthy fats, limiting salt (sodium) intake and making other diet and lifestyle changes. Lose weight if you are overweight. Losing just 5-10% of your body weight can help your overall health and prevent diseases such as diabetes and heart disease. Focus on eating a balance of foods, including fruits and vegetables, low-fat or nonfat dairy, lean protein, nuts and legumes, whole grains,  and heart-healthy oils and fats. This information is not intended to replace advice given to you by your health care provider. Make sure you discuss any questions you have with your health care provider. Document Revised: 11/11/2021 Document Reviewed: 11/11/2021 Elsevier Patient Education  2024 Elsevier Inc.    Please schedule a Follow-up Appointment to: Return in about 6 months (around 02/28/2024) for 6 month follow-up, weight, energy, insomia, updates.  If you have any other questions or concerns, please feel free to call the office or send a message through MyChart. You may also schedule an earlier appointment if necessary.  Additionally, you may be receiving a survey about your experience at our office within a few days to 1 week by e-mail or mail. We value your feedback.  Saralyn Pilar, DO [facility], [place]

## 2023-09-01 LAB — LIPID PANEL
Cholesterol: 255 mg/dL — ABNORMAL HIGH (ref <200)
HDL: 72 mg/dL (ref >=40)
LDL Cholesterol (Calc): 161 mg/dL — ABNORMAL HIGH
Non-HDL Cholesterol (Calc): 183 mg/dL — ABNORMAL HIGH (ref <130)
Total CHOL/HDL Ratio: 3.5 (calc) (ref <5.0)
Triglycerides: 107 mg/dL (ref <150)

## 2023-09-04 ENCOUNTER — Encounter: Payer: Self-pay | Admitting: Family Medicine

## 2023-09-04 DIAGNOSIS — G43909 Migraine, unspecified, not intractable, without status migrainosus: Secondary | ICD-10-CM

## 2023-09-07 ENCOUNTER — Inpatient Hospital Stay: Admission: RE | Admit: 2023-09-07 | Payer: MEDICAID | Source: Ambulatory Visit

## 2023-09-07 MED ORDER — RIZATRIPTAN BENZOATE 10 MG PO TBDP
10.0000 mg | ORAL_TABLET | ORAL | 2 refills | Status: DC | PRN
Start: 2023-09-07 — End: 2024-03-16

## 2023-09-09 LAB — COLOGUARD: COLOGUARD: NEGATIVE

## 2023-09-16 ENCOUNTER — Encounter: Payer: Self-pay | Admitting: Family Medicine

## 2023-09-16 DIAGNOSIS — G4700 Insomnia, unspecified: Secondary | ICD-10-CM | POA: Insufficient documentation

## 2023-11-02 ENCOUNTER — Other Ambulatory Visit: Payer: MEDICAID

## 2023-11-04 ENCOUNTER — Ambulatory Visit: Payer: MEDICAID | Admitting: Family Medicine

## 2023-11-09 ENCOUNTER — Ambulatory Visit: Payer: MEDICAID | Admitting: Family Medicine

## 2023-11-30 ENCOUNTER — Ambulatory Visit (INDEPENDENT_AMBULATORY_CARE_PROVIDER_SITE_OTHER): Payer: MEDICAID | Admitting: Family Medicine

## 2023-11-30 VITALS — BP 130/86 | HR 90 | Ht 70.5 in | Wt 201.0 lb

## 2023-11-30 DIAGNOSIS — R6884 Jaw pain: Secondary | ICD-10-CM

## 2023-11-30 DIAGNOSIS — K047 Periapical abscess without sinus: Secondary | ICD-10-CM | POA: Diagnosis not present

## 2023-11-30 DIAGNOSIS — M792 Neuralgia and neuritis, unspecified: Secondary | ICD-10-CM | POA: Diagnosis not present

## 2023-11-30 MED ORDER — AMOXICILLIN-POT CLAVULANATE 875-125 MG PO TABS
1.0000 | ORAL_TABLET | Freq: Two times a day (BID) | ORAL | 0 refills | Status: DC
Start: 2023-11-30 — End: 2024-02-29

## 2023-11-30 MED ORDER — GABAPENTIN 100 MG PO CAPS
ORAL_CAPSULE | ORAL | 0 refills | Status: AC
Start: 2023-11-30 — End: ?

## 2023-11-30 NOTE — Progress Notes (Signed)
 Subjective:    Patient ID: Adam Cannon, male    DOB: 03-29-1966, 58 y.o.   MRN: [id number]  Adam Cannon is a 58 y.o. male presenting on 11/30/2023 for Dental Pain   HPI  Discussed the use of AI scribe software for clinical note transcription with the patient, who gave verbal consent to proceed.  History of Present Illness    Adam Cannon is a 58 year old male who presents with persistent jaw pain following dental extractions.  He has persistent mouth and jaw pain following dental procedures. Initially, he visited a dentist in December, who recommended the removal of a back wisdom tooth on the right side. Despite the extraction, he continued to experience pain and sought a second opinion, leading to a referral to an oral surgeon. The oral surgeon suspected a dry socket and prescribed antibiotics, but the pain persisted. Subsequently, another tooth adjacent to the extracted one was removed about three weeks ago.  He describes the pain as constant, affecting him all day, every day, and radiating from his jaw to his ear. There is significant tenderness to touch and swelling, which has recently started to subside with the application of ice. He has been using over-the-counter medications such as Tylenol  and Goody's powders to manage the pain, but finds them insufficient. The pain is exacerbated by chewing and opening his mouth, although there is no clicking or sticking of the jaw. He suspects that the initial dental procedure may have caused some damage, possibly to a nerve, tendon, or muscle, as the extraction was particularly difficult and took an hour to complete.  He has a history of sinus issues, with constant nasal drainage, but denies any significant sinus-related pain. He recalls being prescribed gabapentin  in the past for neck pain, which was managed with physical therapy. He is unsure if he still has the medication or if it was effective for his current symptoms.         08/31/2023     9:33 AM 07/02/2023   12:09 AM 07/02/2023   12:06 AM  Depression screen PHQ 2/9  Decreased Interest 0 0 0  Down, Depressed, Hopeless 0 0 0  PHQ - 2 Score 0 0 0  Altered sleeping 0    Tired, decreased energy 2    Change in appetite 0    Feeling bad or failure about yourself  0    Trouble concentrating 0    Moving slowly or fidgety/restless 0    Suicidal thoughts 0    PHQ-9 Score 2         08/31/2023    9:33 AM 04/16/2023   10:59 AM 03/26/2023    8:40 AM 01/17/2022    8:58 AM  GAD 7 : Generalized Anxiety Score  Nervous, Anxious, on Edge 0 0 0 0  Control/stop worrying 0 0 0 0  Worry too much - different things 0 0 0 0  Trouble relaxing 0 0 0 0  Restless 0 0 0 0  Easily annoyed or irritable 0 0 0 0  Afraid - awful might happen 0 0 0 0  Total GAD 7 Score 0 0 0 0  Anxiety Difficulty  Not difficult at all Not difficult at all Not difficult at all    Social History   Tobacco Use   Smoking status: Never    Passive exposure: Never   Smokeless tobacco: Never  Vaping Use   Vaping status: Never Used  Substance Use Topics   Alcohol  use: No   Drug use: Never    Review of Systems Per HPI unless specifically indicated above     Objective:    BP 130/86   Pulse 90   Ht 5' 10.5" (1.791 m)   Wt 201 lb (91.2 kg)   SpO2 98%   BMI 28.43 kg/m   Wt Readings from Last 3 Encounters:  11/30/23 201 lb (91.2 kg)  08/31/23 200 lb (90.7 kg)  07/27/23 202 lb 9.6 oz (91.9 kg)    Physical Exam Vitals and nursing note reviewed.  Constitutional:      General: He is not in acute distress.    Appearance: Normal appearance. He is well-developed. He is not diaphoretic.     Comments: Well-appearing, comfortable, cooperative  HENT:     Head: Normocephalic and atraumatic.     Comments: Mild tender across angle of mandible jaw R side, no asymmetry of movement or clicking popping with jaw movement.    Right Ear: Ear canal and external ear normal. There is no impacted cerumen.     Left Ear: Ear  canal and external ear normal. There is no impacted cerumen.     Mouth/Throat:     Mouth: Mucous membranes are moist.     Pharynx: No oropharyngeal exudate or posterior oropharyngeal erythema.     Comments: Small area of redness on R inner cheek oral mucosa, question if this is related. No obvious dental gum infection or swelling or damage today Eyes:     General:        Right eye: No discharge.        Left eye: No discharge.     Conjunctiva/sclera: Conjunctivae normal.  Cardiovascular:     Rate and Rhythm: Normal rate.  Pulmonary:     Effort: Pulmonary effort is normal.  Skin:    General: Skin is warm and dry.     Findings: No erythema or rash.  Neurological:     Mental Status: He is alert and oriented to person, place, and time.  Psychiatric:        Mood and Affect: Mood normal.        Behavior: Behavior normal.        Thought Content: Thought content normal.     Comments: Well groomed, good eye contact, normal speech and thoughts     Results for orders placed or performed in visit on 08/31/23  Lipid panel   Collection Time: 08/31/23  9:34 AM  Result Value Ref Range   Cholesterol 255 (H) <200 mg/dL   HDL 72 > OR = 40 mg/dL   Triglycerides 161 <096 mg/dL   LDL Cholesterol (Calc) 161 (H) mg/dL (calc)   Total CHOL/HDL Ratio 3.5 <5.0 (calc)   Non-HDL Cholesterol (Calc) 183 (H) <130 mg/dL (calc)      Assessment & Plan:   Problem List Items Addressed This Visit   None Visit Diagnoses       Pain in lower jaw    -  Primary   Relevant Medications   gabapentin  (NEURONTIN ) 100 MG capsule     Dental infection       Relevant Medications   amoxicillin -clavulanate (AUGMENTIN ) 875-125 MG tablet     Neuropathic pain             R Jaw Pain / Post-Operative Dental Pain Persistent pain following two tooth extractions, with the pain radiating to the ear. Suspected nerve damage due to the procedure. No signs of infection or structural damage observed. -  Pain seems neuropathic  and referred across toward ear based on location. No evidence of TMJ or other structural defect  -Start Gabapentin  100mg , initially one at bedtime, can increase to one three times a day as needed for pain control. Titrate up to 100 THREE TIMES A DAY or inc dose up to THREE TIMES A DAY   -Back up plan, rx Start Augmentin  antibiotic course, not able to confirm visually infection but it has been recommended to him by dentist may have possibility of infection  Follow-up Monitor response to Gabapentin  and antibiotics. If pain persists, consider referral to a pain management specialist for potential nerve block.   Also continue with dentist / oral surgery, understandable that he is frustrated after multiple extractions and procedures, but again advised him that this is not necessarily something that I manage for medical care instead of dental care.         No orders of the defined types were placed in this encounter.   Meds ordered this encounter  Medications   gabapentin  (NEURONTIN ) 100 MG capsule    Sig: Start 1 capsule daily, increase by 1 cap every 2-3 days as tolerated up to 3 times a day, or may take 3 at once in evening.    Dispense:  90 capsule    Refill:  0   amoxicillin -clavulanate (AUGMENTIN ) 875-125 MG tablet    Sig: Take 1 tablet by mouth 2 (two) times daily.    Dispense:  20 tablet    Refill:  0    Follow up plan: Return if symptoms worsen or fail to improve.   Domingo Friend, DO [facility] 11/30/2023, 10:03 AM

## 2023-11-30 NOTE — Patient Instructions (Addendum)
 Thank you for coming to the office today.  Start Gabapentin  100mg  capsules, take at night for 2-3 nights only, and then increase to 2 times a day for a few days, and then may increase to 3 times a day, it may make you drowsy, if helps significantly at night only, then you can increase instead to 3 capsules at night, instead of 3 times a day - In the future if needed, we can significantly increase the dose if tolerated well, some common doses are 300mg  three times a day up to 600mg  three times a day, usually it takes several weeks or months to get to higher doses  Antibiotic trial just in case.  Please schedule a Follow-up Appointment to: Return if symptoms worsen or fail to improve.  If you have any other questions or concerns, please feel free to call the office or send a message through MyChart. You may also schedule an earlier appointment if necessary.  Additionally, you may be receiving a survey about your experience at our office within a few days to 1 week by e-mail or mail. We value your feedback.  Domingo Friend, DO [facility], [place]

## 2023-12-11 ENCOUNTER — Encounter: Payer: Self-pay | Admitting: Family Medicine

## 2023-12-11 DIAGNOSIS — M792 Neuralgia and neuritis, unspecified: Secondary | ICD-10-CM

## 2023-12-11 MED ORDER — PREDNISONE 20 MG PO TABS: ORAL_TABLET | ORAL | 0 refills | Status: DC

## 2024-02-29 ENCOUNTER — Encounter: Payer: Self-pay | Admitting: Family Medicine

## 2024-02-29 ENCOUNTER — Ambulatory Visit: Payer: MEDICAID | Admitting: Family Medicine

## 2024-02-29 VITALS — BP 120/82 | HR 71 | Ht 70.5 in | Wt 183.5 lb

## 2024-02-29 DIAGNOSIS — R7303 Prediabetes: Secondary | ICD-10-CM | POA: Diagnosis not present

## 2024-02-29 DIAGNOSIS — F200 Paranoid schizophrenia: Secondary | ICD-10-CM

## 2024-02-29 DIAGNOSIS — R369 Urethral discharge, unspecified: Secondary | ICD-10-CM | POA: Diagnosis not present

## 2024-02-29 DIAGNOSIS — F5104 Psychophysiologic insomnia: Secondary | ICD-10-CM | POA: Diagnosis not present

## 2024-02-29 NOTE — Progress Notes (Signed)
 Subjective:    Patient ID: Adam Cannon, male    DOB: [birth year], 58 y.o.   MRN: [id number]  DEWAINE Cannon is a 58 y.o. male presenting on 02/29/2024 for Insomnia   HPI  Discussed the use of AI scribe software for clinical note transcription with the patient, who gave verbal consent to proceed.  History of Present Illness   Adam Cannon is a 59 year old male who presents with recurrent urethral discharge.  He has been experiencing recurrent urethral discharge, initially noticed during a bowel movement when straining. He has been abstinent from sexual activity and recalls a similar issue previously addressed by a urologist. In April 2024, he was treated with a two-week course of doxycycline  for urethral discharge, which resolved the symptoms at that time. He had been symptom-free for over a year until recently.  He is currently on doxycycline  for a bone infection in the jaw diagnosed as osteomyelitis. This treatment began following a procedure in April 2025 to remove some of the infection. Despite being on doxycycline , the urethral discharge has recurred. He is on a soft diet to prevent further jaw deterioration and is scheduled to see an oral medicine specialist at [facility] next month.  He has issues with sleep, specifically waking up at 3 AM and having difficulty returning to sleep. He is taking trazodone  for sleep but finds the prescribed dose too strong, requiring him to break the tablets into smaller doses. He is also on paroxetine  for mental health.  He has lost weight, now weighing 183 pounds, which he attributes to his soft food diet. No issues with weight management.      Elevated A1c Last lab resulted 06/01/23  5.6 improved   GERD Controlled on PPI Omeprazole  40mg    Insomnia Poor Sleep Low Energy / Fatigue Excessive Daytime Sleepiness   On medication management per Psychiatry - Dr Wilhemenia Hard at [facility]   He is often in bed 11-12am Wakes up unprompted by 3am Tries to fall back to  sleep sometimes difficulty, may take 1-1.5 hr to fall back asleep Has to wake up by 730am He has Trazodone  but says it is too much and not feeling rested. Stopped taking this. on Trazodone  100mg  but takes quarter tab, ask them for lower dose.      02/29/2024    8:23 AM 08/31/2023    9:33 AM 07/02/2023   12:09 AM  Depression screen PHQ 2/9  Decreased Interest 0 0 0  Down, Depressed, Hopeless 0 0 0  PHQ - 2 Score 0 0 0  Altered sleeping 2 0   Tired, decreased energy 0 2   Change in appetite 0 0   Feeling bad or failure about yourself  0 0   Trouble concentrating 0 0   Moving slowly or fidgety/restless 0 0   Suicidal thoughts 0 0   PHQ-9 Score 2 2   Difficult doing work/chores Somewhat difficult         02/29/2024    8:24 AM 08/31/2023    9:33 AM 04/16/2023   10:59 AM 03/26/2023    8:40 AM  GAD 7 : Generalized Anxiety Score  Nervous, Anxious, on Edge 0 0 0 0  Control/stop worrying 0 0 0 0  Worry too much - different things 0 0 0 0  Trouble relaxing 0 0 0 0  Restless 0 0 0 0  Easily annoyed or irritable 2 0 0 0  Afraid - awful might happen 0 0 0 0  Total GAD 7 Score 2 0 0 0  Anxiety Difficulty Somewhat difficult  Not difficult at all Not difficult at all    Social History   Tobacco Use   Smoking status: Never    Passive exposure: Never   Smokeless tobacco: Never  Vaping Use   Vaping status: Never Used  Substance Use Topics   Alcohol use: No   Drug use: Never    Review of Systems Per HPI unless specifically indicated above     Objective:     BP 120/82 (BP Location: Right Arm, Patient Position: Sitting, Cuff Size: Normal)   Pulse 71   Ht 5' 10.5" (1.791 m)   Wt 183 lb 8 oz (83.2 kg)   SpO2 100%   BMI 25.96 kg/m   Wt Readings from Last 3 Encounters:  02/29/24 183 lb 8 oz (83.2 kg)  11/30/23 201 lb (91.2 kg)  08/31/23 200 lb (90.7 kg)    Physical Exam Vitals and nursing note reviewed.  Constitutional:      General: He is not in acute distress.     Appearance: Normal appearance. He is well-developed. He is not diaphoretic.     Comments: Well-appearing, comfortable, cooperative  HENT:     Head: Normocephalic and atraumatic.  Eyes:     General:        Right eye: No discharge.        Left eye: No discharge.     Conjunctiva/sclera: Conjunctivae normal.  Cardiovascular:     Rate and Rhythm: Normal rate.  Pulmonary:     Effort: Pulmonary effort is normal.  Skin:    General: Skin is warm and dry.     Findings: No erythema or rash.  Neurological:     Mental Status: He is alert and oriented to person, place, and time.  Psychiatric:        Mood and Affect: Mood normal.        Behavior: Behavior normal.        Thought Content: Thought content normal.     Comments: Well groomed, good eye contact, normal speech and thoughts     Results for orders placed or performed in visit on 08/31/23  Lipid panel   Collection Time: 08/31/23  9:34 AM  Result Value Ref Range   Cholesterol 255 (H) <200 mg/dL   HDL 72 > OR = 40 mg/dL   Triglycerides 161 <096 mg/dL   LDL Cholesterol (Calc) 161 (H) mg/dL (calc)   Total CHOL/HDL Ratio 3.5 <5.0 (calc)   Non-HDL Cholesterol (Calc) 183 (H) <130 mg/dL (calc)      Assessment & Plan:   Problem List Items Addressed This Visit     Insomnia - Primary   Paranoid schizophrenia (HCC)   Pre-diabetes   Other Visit Diagnoses       Urethral discharge in male            Urethral discharge Intermittent discharge post-bowel movements. Prior issue originally evaluated by Urology April 2024. Treated with Doxycycline  and resolved. No further episodes until recently. Now interestingly enough he is already on Doxycycline  for dental infection. Differential includes urethritis, prostatitis, or benign discharge.  He is abstinent from sexual activity and he says previous STD tests negative.  No other urinary symptoms Advised him that I would route his chart and question back to Urology Dr Estanislao Heimlich to get his input  next and can determine if he has suggestions or to return to see him in follow-up  History Osteomyelitis of the  jaw Followed by Oral Surgery Diagnosed post-bone infection. Underwent debridement, on doxycycline . Referred to oral medicine specialist due to bone deterioration. On soft diet to prevent fracture. - Continue doxycycline  as prescribed. - Maintain soft diet to prevent jaw fracture. - Follow up with oral medicine specialist at [facility] next month.  Insomnia Difficulty maintaining sleep, waking at 3 AM. Reduced trazodone  dose due to sedation. He should be on 50mg  tab so he can take half If needed if it is too strong, not 100mg .  Insomnia likely related to mental health and medications. Discussed medication adjustment with psychiatrist. - Discuss with Dr. Wilhemenia Hard about alternative sleep medication options for sleep maintenance.       Route chart to Dr Estanislao Heimlich Urology and ask if she has suggestions on the urethral discharge again, returned since April 2024, last time treated with Doxycycline  (question prostatitis, urethritis, physiologic) abstinent no new sexual contact - Note he is CURRENTLY on Doxycycline  for his jaw since 01/2024   No orders of the defined types were placed in this encounter.   No orders of the defined types were placed in this encounter.   Follow up plan: Return for 6 month Annual fasting lab after.   Domingo Friend, DO [facility] 02/29/2024, 8:43 AM

## 2024-02-29 NOTE — Patient Instructions (Addendum)
 Thank you for coming to the office today.  I will ask Dr Estanislao Heimlich about your urethral discharge  You can ask Dr Wilhemenia Hard about alternative sleep medication options for sleep maintenance, the waking up in the middle of the night issue.  DUE for FASTING BLOOD WORK (no food or drink after midnight before the lab appointment, only water or coffee without cream/sugar on the morning of)  SCHEDULE "Lab Only" visit in the morning at the clinic for lab draw in 6 MONTHS   - Make sure Lab Only appointment is at about 1 week before your next appointment, so that results will be available  For Lab Results, once available within 2-3 days of blood draw, you can can log in to MyChart online to view your results and a brief explanation. Also, we can discuss results at next follow-up visit.   Please schedule a Follow-up Appointment to: Return for 6 month Annual fasting lab after.  If you have any other questions or concerns, please feel free to call the office or send a message through MyChart. You may also schedule an earlier appointment if necessary.  Additionally, you may be receiving a survey about your experience at our office within a few days to 1 week by e-mail or mail. We value your feedback.  Domingo Friend, DO [facility], [place]

## 2024-03-03 ENCOUNTER — Encounter: Payer: Self-pay | Admitting: Family Medicine

## 2024-03-16 ENCOUNTER — Ambulatory Visit (INDEPENDENT_AMBULATORY_CARE_PROVIDER_SITE_OTHER): Payer: MEDICAID | Admitting: Family Medicine

## 2024-03-16 ENCOUNTER — Encounter: Payer: Self-pay | Admitting: Family Medicine

## 2024-03-16 VITALS — BP 122/78 | HR 88 | Temp 98.5°F | Ht 70.5 in | Wt 190.1 lb

## 2024-03-16 DIAGNOSIS — K219 Gastro-esophageal reflux disease without esophagitis: Secondary | ICD-10-CM

## 2024-03-16 DIAGNOSIS — G43909 Migraine, unspecified, not intractable, without status migrainosus: Secondary | ICD-10-CM

## 2024-03-16 MED ORDER — RIZATRIPTAN BENZOATE 10 MG PO TBDP: 10.0000 mg | ORAL_TABLET | ORAL | 2 refills | Status: DC | PRN

## 2024-03-16 MED ORDER — SUCRALFATE 1 G PO TABS: 1.0000 g | ORAL_TABLET | Freq: Three times a day (TID) | ORAL | 0 refills | Status: DC

## 2024-03-16 MED ORDER — OMEPRAZOLE 40 MG PO CPDR: 40.0000 mg | DELAYED_RELEASE_CAPSULE | Freq: Every day | ORAL | 1 refills | Status: DC

## 2024-03-16 NOTE — Progress Notes (Signed)
 Subjective:    Patient ID: Adam Cannon, male    DOB: [birth year], 58 y.o.   MRN: [id number]  Adam Cannon is a 58 y.o. male presenting on 03/16/2024 for Migraine (Headache) and Gastroesophageal Reflux   HPI  Discussed the use of AI scribe software for clinical note transcription with the patient, who gave verbal consent to proceed.  History of Present Illness   Adam Cannon is a 58 year old male with acid reflux and migraines who presents with headache and stomach issues following a cookout.  He attended a cookout on Saturday where he consumed greasy food, which he typically does not tolerate well. By Sunday, he experienced a severe headache and ongoing stomach issues, including bloating and discomfort, with a slight occurrence of diarrhea but no vomiting. He notes a significant decrease in appetite and persistent bloating since Monday.  He has a history of acid reflux and has not been taking his prescribed omeprazole  40 mg, which was last ordered in July. He is unsure if he still has the medication at home. He has been using Midol for bloating, which contains ibuprofen, but has not taken any anti-inflammatory medications like naproxen  or Celebrex recently.  Regarding his headache, he describes it as severe, making him feel sick, and has been using Tylenol  and ibuprofen for relief. He has a history of migraines and previously used rizatriptan , which he is currently out of. He describes the headache as severe, stating that it 'hurts so bad, makes me sick.'  He also reports chronic sinus issues with a constantly runny nose but does not feel that these symptoms have changed recently. No significant sinus-related sickness or infection symptoms currently.         02/29/2024    8:23 AM 08/31/2023    9:33 AM 07/02/2023   12:09 AM  Depression screen PHQ 2/9  Decreased Interest 0 0 0  Down, Depressed, Hopeless 0 0 0  PHQ - 2 Score 0 0 0  Altered sleeping 2 0   Tired, decreased energy 0 2    Change in appetite 0 0   Feeling bad or failure about yourself  0 0   Trouble concentrating 0 0   Moving slowly or fidgety/restless 0 0   Suicidal thoughts 0 0   PHQ-9 Score 2 2   Difficult doing work/chores Somewhat difficult         02/29/2024    8:24 AM 08/31/2023    9:33 AM 04/16/2023   10:59 AM 03/26/2023    8:40 AM  GAD 7 : Generalized Anxiety Score  Nervous, Anxious, on Edge 0 0 0 0  Control/stop worrying 0 0 0 0  Worry too much - different things 0 0 0 0  Trouble relaxing 0 0 0 0  Restless 0 0 0 0  Easily annoyed or irritable 2 0 0 0  Afraid - awful might happen 0 0 0 0  Total GAD 7 Score 2 0 0 0  Anxiety Difficulty Somewhat difficult  Not difficult at all Not difficult at all    Social History   Tobacco Use   Smoking status: Never    Passive exposure: Never   Smokeless tobacco: Never  Vaping Use   Vaping status: Never Used  Substance Use Topics   Alcohol use: No   Drug use: Never    Review of Systems Per HPI unless specifically indicated above     Objective:     BP 122/78 (BP Location: Right Arm,  Patient Position: Sitting, Cuff Size: Normal)   Pulse 88   Temp 98.5 F (36.9 C) (Oral)   Ht 5' 10.5" (1.791 m)   Wt 190 lb 2 oz (86.2 kg)   SpO2 98%   BMI 26.89 kg/m   Wt Readings from Last 3 Encounters:  03/16/24 190 lb 2 oz (86.2 kg)  02/29/24 183 lb 8 oz (83.2 kg)  11/30/23 201 lb (91.2 kg)    Physical Exam Vitals and nursing note reviewed.  Constitutional:      General: He is not in acute distress.    Appearance: Normal appearance. He is well-developed. He is not diaphoretic.     Comments: Well-appearing, comfortable, cooperative  HENT:     Head: Normocephalic and atraumatic.  Eyes:     General:        Right eye: No discharge.        Left eye: No discharge.     Conjunctiva/sclera: Conjunctivae normal.  Cardiovascular:     Rate and Rhythm: Normal rate.  Pulmonary:     Effort: Pulmonary effort is normal.  Skin:    General: Skin is warm  and dry.     Findings: No erythema or rash.  Neurological:     Mental Status: He is alert and oriented to person, place, and time.  Psychiatric:        Mood and Affect: Mood normal.        Behavior: Behavior normal.        Thought Content: Thought content normal.     Comments: Well groomed, good eye contact, normal speech and thoughts     Results for orders placed or performed in visit on 08/31/23  Lipid panel   Collection Time: 08/31/23  9:34 AM  Result Value Ref Range   Cholesterol 255 (H) <200 mg/dL   HDL 72 > OR = 40 mg/dL   Triglycerides 161 <096 mg/dL   LDL Cholesterol (Calc) 161 (H) mg/dL (calc)   Total CHOL/HDL Ratio 3.5 <5.0 (calc)   Non-HDL Cholesterol (Calc) 183 (H) <130 mg/dL (calc)      Assessment & Plan:   Problem List Items Addressed This Visit     Gastroesophageal reflux disease without esophagitis - Primary   Relevant Medications   omeprazole  (PRILOSEC) 40 MG capsule   sucralfate (CARAFATE) 1 g tablet   Other Visit Diagnoses       Episodic migraine       Relevant Medications   rizatriptan  (MAXALT -MLT) 10 MG disintegrating tablet        Episodic Migraine Episodic Severe headache with nausea, likely migraine. Previous rizatriptan  effective but out of rx. - Order rizatriptan . Instruct to take one tablet at onset, may repeat in two hours if needed, max two tablets in 24 hours.  Gastroesophageal reflux disease (GERD) / Indigestion vs Functional GI symptoms GERD exacerbation likely due to dietary indiscretion. Off omeprazole  for months. - Restart omeprazole  40 mg daily for 4 to 12 weeks, option to discontinue if symptoms improve. - Add Sucralfate AS NEEDED for breakthrough symptoms   No orders of the defined types were placed in this encounter.   Meds ordered this encounter  Medications   omeprazole  (PRILOSEC) 40 MG capsule    Sig: Take 1 capsule (40 mg total) by mouth daily before breakfast.    Dispense:  90 capsule    Refill:  1   rizatriptan   (MAXALT -MLT) 10 MG disintegrating tablet    Sig: Take 1 tablet (10 mg total) by mouth as needed  for migraine. May repeat in 2 hours if needed    Dispense:  10 tablet    Refill:  2   sucralfate (CARAFATE) 1 g tablet    Sig: Take 1 tablet (1 g total) by mouth 4 (four) times daily -  with meals and at bedtime. As needed for indigestion    Dispense:  30 tablet    Refill:  0    Follow up plan: Return if symptoms worsen or fail to improve.   Domingo Friend, DO [facility] 03/16/2024, 1:53 PM

## 2024-03-16 NOTE — Patient Instructions (Addendum)
 Thank you for coming to the office today.  Try Rizatriptan  Maxalt  dissolving tab for the headache, most likely migraine. - Can repeat dose within 2 hours if needed. That is it for 24 hours  For indigestion Restart Omeprazole  40mg  daily for 4-12 weeks, can end this sooner if you are much improved, goal to use it for AS NEEDED episodes several weeks at a time.  Add Carafate Sucralfate for indigestion short term if you need, in the moment.   Please schedule a Follow-up Appointment to: Return if symptoms worsen or fail to improve.  If you have any other questions or concerns, please feel free to call the office or send a message through MyChart. You may also schedule an earlier appointment if necessary.  Additionally, you may be receiving a survey about your experience at our office within a few days to 1 week by e-mail or mail. We value your feedback.  Domingo Friend, DO [facility], [place]

## 2024-03-17 ENCOUNTER — Telehealth: Payer: MEDICAID | Admitting: Physician Assistant

## 2024-03-17 DIAGNOSIS — B9689 Other specified bacterial agents as the cause of diseases classified elsewhere: Secondary | ICD-10-CM | POA: Diagnosis not present

## 2024-03-17 DIAGNOSIS — J019 Acute sinusitis, unspecified: Secondary | ICD-10-CM

## 2024-03-17 MED ORDER — AMOXICILLIN-POT CLAVULANATE 875-125 MG PO TABS: 1.0000 | ORAL_TABLET | Freq: Two times a day (BID) | ORAL | 0 refills | Status: DC

## 2024-03-17 NOTE — Progress Notes (Signed)
 Virtual Visit Consent   Adam Cannon, you are scheduled for a virtual visit with a [facility] provider today. Just as with appointments in the office, your consent must be obtained to participate. Your consent will be active for this visit and any virtual visit you may have with one of our providers in the next 365 days. If you have a MyChart account, a copy of this consent can be sent to you electronically.  As this is a virtual visit, video technology does not allow for your provider to perform a traditional examination. This may limit your provider's ability to fully assess your condition. If your provider identifies any concerns that need to be evaluated in person or the need to arrange testing (such as labs, EKG, etc.), we will make arrangements to do so. Although advances in technology are sophisticated, we cannot ensure that it will always work on either your end or our end. If the connection with a video visit is poor, the visit may have to be switched to a telephone visit. With either a video or telephone visit, we are not always able to ensure that we have a secure connection.  By engaging in this virtual visit, you consent to the provision of healthcare and authorize for your insurance to be billed (if applicable) for the services provided during this visit. Depending on your insurance coverage, you may receive a charge related to this service.  I need to obtain your verbal consent now. Are you willing to proceed with your visit today? [name] has provided verbal consent on 03/17/2024 for a virtual visit (video or telephone). Angelia Kelp, PA-C  Date: 03/17/2024 12:38 PM   Virtual Visit via Video Note   I, Angelia Kelp, connected with  Adam Cannon  ([id number], Jul 10, 1966) on 03/17/24 at 12:30 PM EDT by a video-enabled telemedicine application and verified that I am speaking with the correct person using two identifiers.  Location: Patient: Virtual Visit Location  Patient: Home Provider: Virtual Visit Location Provider: Home Office   I discussed the limitations of evaluation and management by telemedicine and the availability of in person appointments. The patient expressed understanding and agreed to proceed.    History of Present Illness: LOMAN Adam Cannon is a 58 y.o. who identifies as a male who was assigned male at birth, and is being seen today for sinus congestion.  HPI: Sinusitis This is a new problem. The current episode started in the past 7 days. The problem has been gradually worsening since onset. There has been no fever. The pain is moderate. Associated symptoms include congestion, ear pain, headaches and sinus pressure. Pertinent negatives include no chills, coughing, hoarse voice, shortness of breath or sore throat. (Rhinorrhea, post nasal drainage) Past treatments include acetaminophen  (tylenol , ibuprofen, bayer asa). The treatment provided no relief.     Problems:  Patient Active Problem List   Diagnosis Date Noted   Insomnia 09/16/2023   Left medial knee pain 03/26/2023   Gastroesophageal reflux disease without esophagitis 03/26/2023   Schizophrenia, paranoid (HCC) 11/05/2022   Cocaine use 10/02/2022   Paranoid schizophrenia (HCC) 10/01/2022   Psychosis (HCC) 10/01/2022   Pre-diabetes 01/20/2022   Pure hypercholesterolemia 01/20/2022   Overweight (BMI 25.0-29.9) 01/17/2022   Eustachian tube dysfunction, bilateral 12/15/2019    Allergies:  Allergies  Allergen Reactions   Mucinex  [Guaifenesin  Er] Nausea And Vomiting    Allergic to Liquid form only per patient on 12/15/2019 call   Medications:  Current Outpatient Medications:  amoxicillin -clavulanate (AUGMENTIN ) 875-125 MG tablet, Take 1 tablet by mouth 2 (two) times daily., Disp: 14 tablet, Rfl: 0   baclofen  (LIORESAL ) 10 MG tablet, Take 0.5-1 tablets (5-10 mg total) by mouth 3 (three) times daily as needed for muscle spasms., Disp: 30 each, Rfl: 1   buPROPion  (WELLBUTRIN  XL)  300 MG 24 hr tablet, Take 1 tablet (300 mg total) by mouth every morning., Disp: 30 tablet, Rfl: 0   doxycycline  (VIBRAMYCIN ) 100 MG capsule, Take 100 mg by mouth daily., Disp: , Rfl:    gabapentin  (NEURONTIN ) 100 MG capsule, Start 1 capsule daily, increase by 1 cap every 2-3 days as tolerated up to 3 times a day, or may take 3 at once in evening., Disp: 90 capsule, Rfl: 0   hydrOXYzine  (ATARAX ) 25 MG tablet, Take 1-2 tablets (25-50 mg total) by mouth at bedtime as needed., Disp: 30 tablet, Rfl: 0   ipratropium (ATROVENT ) 0.06 % nasal spray, Place 2 sprays into both nostrils 4 (four) times daily as needed for rhinitis., Disp: 15 mL, Rfl: 3   loratadine  (CLARITIN ) 10 MG tablet, Take 1 tablet (10 mg total) by mouth daily., Disp: 90 tablet, Rfl: 3   omeprazole  (PRILOSEC) 40 MG capsule, Take 1 capsule (40 mg total) by mouth daily before breakfast., Disp: 90 capsule, Rfl: 1   ondansetron  (ZOFRAN -ODT) 4 MG disintegrating tablet, DISSOLVE 1 TABLET IN MOUTH EVERY 8 HOURS AS NEEDED FOR NAUSEA FOR VOMITING, Disp: 20 tablet, Rfl: 0   PARoxetine  (PAXIL ) 20 MG tablet, Take 1 tablet (20 mg total) by mouth at bedtime., Disp: 30 tablet, Rfl: 0   rizatriptan  (MAXALT -MLT) 10 MG disintegrating tablet, Take 1 tablet (10 mg total) by mouth as needed for migraine. May repeat in 2 hours if needed, Disp: 10 tablet, Rfl: 2   sucralfate (CARAFATE) 1 g tablet, Take 1 tablet (1 g total) by mouth 4 (four) times daily -  with meals and at bedtime. As needed for indigestion, Disp: 30 tablet, Rfl: 0   tamsulosin  (FLOMAX ) 0.4 MG CAPS capsule, Take 1 capsule (0.4 mg total) by mouth daily after supper., Disp: 30 capsule, Rfl: 0   thiothixene  (NAVANE ) 10 MG capsule, Take 1 capsule (10 mg total) by mouth 2 (two) times daily., Disp: 60 capsule, Rfl: 0   traZODone  (DESYREL ) 100 MG tablet, Take 100 mg by mouth at bedtime., Disp: , Rfl:   Observations/Objective: Patient is well-developed, well-nourished in no acute distress.  Resting  comfortably at home.  Head is normocephalic, atraumatic.  No labored breathing.  Speech is clear and coherent with logical content.  Patient is alert and oriented at baseline.    Assessment and Plan: 1. Acute bacterial sinusitis (Primary) - amoxicillin -clavulanate (AUGMENTIN ) 875-125 MG tablet; Take 1 tablet by mouth 2 (two) times daily.  Dispense: 14 tablet; Refill: 0  - Worsening symptoms that have not responded to OTC medications.  - Will give Augmentin  - Continue allergy medications.  - Steam and humidifier can help - Stay well hydrated and get plenty of rest.  - Seek in person evaluation if no symptom improvement or if symptoms worsen   Follow Up Instructions: I discussed the assessment and treatment plan with the patient. The patient was provided an opportunity to ask questions and all were answered. The patient agreed with the plan and demonstrated an understanding of the instructions.  A copy of instructions were sent to the patient via MyChart unless otherwise noted below.    The patient was advised to call back or seek  an in-person evaluation if the symptoms worsen or if the condition fails to improve as anticipated.    Angelia Kelp, PA-C

## 2024-03-17 NOTE — Patient Instructions (Signed)
 Adam Cannon, thank you for joining Angelia Kelp, PA-C for today's virtual visit.  While this provider is not your primary care provider (PCP), if your PCP is located in our provider database this encounter information will be shared with them immediately following your visit.   A [facility] MyChart account gives you access to today's visit and all your visits, tests, and labs performed at [facility] " click here if you don't have a [facility] MyChart account or go to [email or web address]  Consent: (Patient) Adam Cannon provided verbal consent for this virtual visit at the beginning of the encounter.  Current Medications:  Current Outpatient Medications:    amoxicillin -clavulanate (AUGMENTIN ) 875-125 MG tablet, Take 1 tablet by mouth 2 (two) times daily., Disp: 14 tablet, Rfl: 0   baclofen  (LIORESAL ) 10 MG tablet, Take 0.5-1 tablets (5-10 mg total) by mouth 3 (three) times daily as needed for muscle spasms., Disp: 30 each, Rfl: 1   buPROPion  (WELLBUTRIN  XL) 300 MG 24 hr tablet, Take 1 tablet (300 mg total) by mouth every morning., Disp: 30 tablet, Rfl: 0   doxycycline  (VIBRAMYCIN ) 100 MG capsule, Take 100 mg by mouth daily., Disp: , Rfl:    gabapentin  (NEURONTIN ) 100 MG capsule, Start 1 capsule daily, increase by 1 cap every 2-3 days as tolerated up to 3 times a day, or may take 3 at once in evening., Disp: 90 capsule, Rfl: 0   hydrOXYzine  (ATARAX ) 25 MG tablet, Take 1-2 tablets (25-50 mg total) by mouth at bedtime as needed., Disp: 30 tablet, Rfl: 0   ipratropium (ATROVENT ) 0.06 % nasal spray, Place 2 sprays into both nostrils 4 (four) times daily as needed for rhinitis., Disp: 15 mL, Rfl: 3   loratadine  (CLARITIN ) 10 MG tablet, Take 1 tablet (10 mg total) by mouth daily., Disp: 90 tablet, Rfl: 3   omeprazole  (PRILOSEC) 40 MG capsule, Take 1 capsule (40 mg total) by mouth daily before breakfast., Disp: 90 capsule, Rfl: 1   ondansetron  (ZOFRAN -ODT) 4 MG disintegrating  tablet, DISSOLVE 1 TABLET IN MOUTH EVERY 8 HOURS AS NEEDED FOR NAUSEA FOR VOMITING, Disp: 20 tablet, Rfl: 0   PARoxetine  (PAXIL ) 20 MG tablet, Take 1 tablet (20 mg total) by mouth at bedtime., Disp: 30 tablet, Rfl: 0   rizatriptan  (MAXALT -MLT) 10 MG disintegrating tablet, Take 1 tablet (10 mg total) by mouth as needed for migraine. May repeat in 2 hours if needed, Disp: 10 tablet, Rfl: 2   sucralfate (CARAFATE) 1 g tablet, Take 1 tablet (1 g total) by mouth 4 (four) times daily -  with meals and at bedtime. As needed for indigestion, Disp: 30 tablet, Rfl: 0   tamsulosin  (FLOMAX ) 0.4 MG CAPS capsule, Take 1 capsule (0.4 mg total) by mouth daily after supper., Disp: 30 capsule, Rfl: 0   thiothixene  (NAVANE ) 10 MG capsule, Take 1 capsule (10 mg total) by mouth 2 (two) times daily., Disp: 60 capsule, Rfl: 0   traZODone  (DESYREL ) 100 MG tablet, Take 100 mg by mouth at bedtime., Disp: , Rfl:    Medications ordered in this encounter:  Meds ordered this encounter  Medications   amoxicillin -clavulanate (AUGMENTIN ) 875-125 MG tablet    Sig: Take 1 tablet by mouth 2 (two) times daily.    Dispense:  14 tablet    Refill:  0    Supervising Provider:   Corine Dice [phone]     *If you need refills on other medications prior to your next appointment, please contact your  pharmacy*  Follow-Up: Call back or seek an in-person evaluation if the symptoms worsen or if the condition fails to improve as anticipated.  [facility] [phone]  Other Instructions Sinus Infection, Adult A sinus infection, also called sinusitis, is inflammation of your sinuses. Sinuses are hollow spaces in the bones around your face. Your sinuses are located: Around your eyes. In the middle of your forehead. Behind your nose. In your cheekbones. Mucus normally drains out of your sinuses. When your nasal tissues become inflamed or swollen, mucus can become trapped or blocked. This allows bacteria, viruses,  and fungi to grow, which leads to infection. Most infections of the sinuses are caused by a virus. A sinus infection can develop quickly. It can last for up to 4 weeks (acute) or for more than 12 weeks (chronic). A sinus infection often develops after a cold. What are the causes? This condition is caused by anything that creates swelling in the sinuses or stops mucus from draining. This includes: Allergies. Asthma. Infection from bacteria or viruses. Deformities or blockages in your nose or sinuses. Abnormal growths in the nose (nasal polyps). Pollutants, such as chemicals or irritants in the air. Infection from fungi. This is rare. What increases the risk? You are more likely to develop this condition if you: Have a weak body defense system (immune system). Do a lot of swimming or diving. Overuse nasal sprays. Smoke. What are the signs or symptoms? The main symptoms of this condition are pain and a feeling of pressure around the affected sinuses. Other symptoms include: Stuffy nose or congestion that makes it difficult to breathe through your nose. Thick yellow or greenish drainage from your nose. Tenderness, swelling, and warmth over the affected sinuses. A cough that may get worse at night. Decreased sense of smell and taste. Extra mucus that collects in the throat or the back of the nose (postnasal drip) causing a sore throat or bad breath. Tiredness (fatigue). Fever. How is this diagnosed? This condition is diagnosed based on: Your symptoms. Your medical history. A physical exam. Tests to find out if your condition is acute or chronic. This may include: Checking your nose for nasal polyps. Viewing your sinuses using a device that has a light (endoscope). Testing for allergies or bacteria. Imaging tests, such as an MRI or CT scan. In rare cases, a bone biopsy may be done to rule out more serious types of fungal sinus disease. How is this treated? Treatment for a sinus  infection depends on the cause and whether your condition is chronic or acute. If caused by a virus, your symptoms should go away on their own within 10 days. You may be given medicines to relieve symptoms. They include: Medicines that shrink swollen nasal passages (decongestants). A spray that eases inflammation of the nostrils (topical intranasal corticosteroids). Rinses that help get rid of thick mucus in your nose (nasal saline washes). Medicines that treat allergies (antihistamines). Over-the-counter pain relievers. If caused by bacteria, your health care provider may recommend waiting to see if your symptoms improve. Most bacterial infections will get better without antibiotic medicine. You may be given antibiotics if you have: A severe infection. A weak immune system. If caused by narrow nasal passages or nasal polyps, surgery may be needed. Follow these instructions at home: Medicines Take, use, or apply over-the-counter and prescription medicines only as told by your health care provider. These may include nasal sprays. If you were prescribed an antibiotic medicine, take it as told by  your health care provider. Do not stop taking the antibiotic even if you start to feel better. Hydrate and humidify  Drink enough fluid to keep your urine pale yellow. Staying hydrated will help to thin your mucus. Use a cool mist humidifier to keep the humidity level in your home above 50%. Inhale steam for 10-15 minutes, 3-4 times a day, or as told by your health care provider. You can do this in the bathroom while a hot shower is running. Limit your exposure to cool or dry air. Rest Rest as much as possible. Sleep with your head raised (elevated). Make sure you get enough sleep each night. General instructions  Apply a warm, moist washcloth to your face 3-4 times a day or as told by your health care provider. This will help with discomfort. Use nasal saline washes as often as told by your health  care provider. Wash your hands often with soap and water to reduce your exposure to germs. If soap and water are not available, use hand sanitizer. Do not smoke. Avoid being around people who are smoking (secondhand smoke). Keep all follow-up visits. This is important. Contact a health care provider if: You have a fever. Your symptoms get worse. Your symptoms do not improve within 10 days. Get help right away if: You have a severe headache. You have persistent vomiting. You have severe pain or swelling around your face or eyes. You have vision problems. You develop confusion. Your neck is stiff. You have trouble breathing. These symptoms may be an emergency. Get help right away. Call 911. Do not wait to see if the symptoms will go away. Do not drive yourself to the hospital. Summary A sinus infection is soreness and inflammation of your sinuses. Sinuses are hollow spaces in the bones around your face. This condition is caused by nasal tissues that become inflamed or swollen. The swelling traps or blocks the flow of mucus. This allows bacteria, viruses, and fungi to grow, which leads to infection. If you were prescribed an antibiotic medicine, take it as told by your health care provider. Do not stop taking the antibiotic even if you start to feel better. Keep all follow-up visits. This is important. This information is not intended to replace advice given to you by your health care provider. Make sure you discuss any questions you have with your health care provider. Document Revised: 09/10/2021 Document Reviewed: 09/10/2021 Elsevier Patient Education  2024 Elsevier Inc.   If you have been instructed to have an in-person evaluation today at a local Urgent Care facility, please use the link below. It will take you to a list of all of our available [facility], including address, phone number and hours of operation. Please do not delay care.  [facility]  If  you or a family member do not have a primary care provider, use the link below to schedule a visit and establish care. When you choose a [facility] primary care physician or advanced practice provider, you gain a long-term partner in health. Find a Primary Care Provider  Learn more about [facility] in-office and virtual care options: [facility] - Get Care Now

## 2024-06-13 ENCOUNTER — Ambulatory Visit: Payer: MEDICAID | Admitting: Family Medicine

## 2024-06-13 ENCOUNTER — Encounter: Payer: Self-pay | Admitting: Family Medicine

## 2024-06-13 VITALS — BP 116/81 | HR 100 | Wt 181.2 lb

## 2024-06-13 DIAGNOSIS — F900 Attention-deficit hyperactivity disorder, predominantly inattentive type: Secondary | ICD-10-CM | POA: Insufficient documentation

## 2024-06-13 NOTE — Patient Instructions (Addendum)
 Thank you for coming to the office today.  Check this list for any place you are interested in for ADHD testing evaluation by Psychology or Therapist.  Mychart message when you are ready to proceed with referral.  After referral - they will schedule with you, and once results are done with the testing, we can see you back and treat.  If you run into issues with scheduling / referral etc, contact back and we can try to help.   These offices have both PSYCHIATRY doctors and THERAPISTS  [facility], water quality Available) [facility] [address] [place], [place] [address] Phone: [phone]  [facility] Address: [address] [place], [place] [address] [email or web address] Phone:  [phone]  [facility]) Address: [address], [place], [place] [address] Hours: 8:30AM-5PM Phone: [phone]  [facility] (Adult, Peds, Geriatric, Counseling) [address] [place], [place] [address] Phone: [phone] Fax: [phone]  [facility] (All ages) [address], [place] [facility] [place], [id number] Phone: [phone] (Option 1) [email or web address]  ----------------------------------------------------------------- THERAPIST ONLY  (No Psychiatry)  [facility] [address] [place], [place] [address] [place]   [phone]  [facility] [place], [place] [address] Phone: [phone]    Please schedule a Follow-up Appointment to: Return if symptoms worsen or fail to improve.  If you have any other questions or concerns, please feel free to call the office or send a message through MyChart. You may also schedule an earlier appointment if necessary.  Additionally, you may be receiving a survey about your experience at our office within a few days to 1 week by  e-mail or mail. We value your feedback.  Marsa Officer, DO [facility], [place]

## 2024-06-13 NOTE — Progress Notes (Signed)
 Subjective:    Patient ID: Adam Cannon, male    DOB: February 05, 1966, 58 y.o.   MRN: [id number]  Adam Cannon is a 58 y.o. male presenting on 06/13/2024 for focus (having trouble focusing on one project. mind is always racing thinking about several things at once that needs to be done. Several different thoughts going on in my mind at once. Noticed in early part of the year )   HPI  Discussed the use of AI scribe software for clinical note transcription with the patient, who gave verbal consent to proceed.  History of Present Illness   Adam Cannon is a 58 year old male who presents with concerns about attention and focus difficulties.  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 - No hyperactivity or feeling scattered - No prior testing or diagnosis of attention deficit disorder (ADD) or attention deficit hyperactivity disorder (ADHD) - No prior work-up or diagnostic studies related to these symptoms  - Not on treatment             02/29/2024    8:23 AM 08/31/2023    9:33 AM 07/02/2023   12:09 AM  Depression screen PHQ 2/9  Decreased Interest 0 0 0  Down, Depressed, Hopeless 0 0 0  PHQ - 2 Score 0 0 0  Altered sleeping 2 0   Tired, decreased energy 0 2   Change in appetite 0 0   Feeling bad or failure about yourself  0 0   Trouble concentrating 0 0   Moving slowly or fidgety/restless 0 0   Suicidal thoughts 0 0   PHQ-9 Score 2 2   Difficult doing work/chores Somewhat difficult         02/29/2024    8:24 AM 08/31/2023    9:33 AM 04/16/2023   10:59 AM 03/26/2023    8:40 AM  GAD 7 : Generalized Anxiety Score  Nervous, Anxious, on Edge 0 0 0 0  Control/stop worrying 0 0 0 0  Worry too much -  different things 0 0 0 0  Trouble relaxing 0 0 0 0  Restless 0 0 0 0  Easily annoyed or irritable 2 0 0 0  Afraid - awful might happen 0 0 0 0  Total GAD 7 Score 2 0 0 0  Anxiety Difficulty Somewhat difficult  Not difficult at all Not difficult at all    Social History   Tobacco Use   Smoking status: Never    Passive exposure: Never   Smokeless tobacco: Never  Vaping Use   Vaping status: Never Used  Substance Use Topics   Alcohol use: No   Drug use: Never    Review of Systems Per HPI unless specifically indicated above     Objective:    BP 116/81 (BP Location: Left Arm, Patient Position: Sitting, Cuff Size: Large)   Pulse 100   Wt 181 lb 3.2 oz (82.2 kg)   SpO2 97%   BMI 25.63 kg/m   Wt Readings from Last 3 Encounters:  06/13/24 181 lb 3.2 oz (82.2 kg)  03/16/24 190 lb 2 oz (86.2 kg)  02/29/24 183 lb 8 oz (83.2 kg)    Physical Exam Vitals and nursing note reviewed.  Constitutional:      General: He is not in acute distress.    Appearance: Normal appearance. He is well-developed. He is not diaphoretic.     Comments: Well-appearing, comfortable, cooperative  HENT:     Head: Normocephalic and atraumatic.  Eyes:     General:        Right eye: No discharge.        Left eye: No discharge.     Conjunctiva/sclera: Conjunctivae normal.  Cardiovascular:     Rate and Rhythm: Normal rate.  Pulmonary:     Effort: Pulmonary effort is normal.  Skin:    General: Skin is warm and dry.     Findings: No erythema or rash.  Neurological:     Mental Status: He is alert and oriented to person, place, and time.  Psychiatric:        Mood and Affect: Mood normal.        Behavior: Behavior normal.        Thought Content: Thought content normal.     Comments: Well groomed, good eye contact, normal speech and thoughts     Results for orders placed or performed in visit on 08/31/23  Lipid panel   Collection Time: 08/31/23  9:34 AM  Result Value Ref Range   Cholesterol 255  (H) <200 mg/dL   HDL 72 > OR = 40 mg/dL   Triglycerides 892 <849 mg/dL   LDL Cholesterol (Calc) 161 (H) mg/dL (calc)   Total CHOL/HDL Ratio 3.5 <5.0 (calc)   Non-HDL Cholesterol (Calc) 183 (H) <130 mg/dL (calc)      Assessment & Plan:   Problem List Items Addressed This Visit     Attention deficit hyperactivity disorder (ADHD), predominantly inattentive type - Primary     Suspected attention-deficit disorder, inattentive type Difficulty with focus and attention, no hyperactivity, suggesting inattentive type ADHD. No prior testing or diagnosis. Explained ADHD types and testing process. - Provide list of local facilities for ADHD testing in [place] and [place]. - Instruct to contact facilities to verify insurance coverage and availability of ADHD testing. - Advise to message via MyChart with chosen facility for referral. - Explain testing typically conducted by psychologists or therapists, not psychiatrists. - Instruct to return for treatment planning after formal diagnosis. Or if at a location that is able to prescribe they may handle therapy       No orders of the defined types were placed in this encounter.   No orders of the defined types were placed in this encounter.   Follow up plan: Return if symptoms worsen or fail to improve.  Marsa Officer, DO [facility] 06/13/2024, 3:56 PM

## 2024-09-08 ENCOUNTER — Telehealth: Payer: Self-pay

## 2024-09-08 ENCOUNTER — Encounter: Payer: Self-pay | Admitting: Family Medicine

## 2024-09-08 NOTE — Telephone Encounter (Signed)
 error

## 2024-09-12 ENCOUNTER — Encounter: Payer: Self-pay | Admitting: Family Medicine

## 2024-09-12 ENCOUNTER — Ambulatory Visit (INDEPENDENT_AMBULATORY_CARE_PROVIDER_SITE_OTHER): Payer: MEDICAID | Admitting: Family Medicine

## 2024-09-12 VITALS — BP 130/84 | HR 76 | Ht 70.5 in | Wt 187.1 lb

## 2024-09-12 DIAGNOSIS — G43909 Migraine, unspecified, not intractable, without status migrainosus: Secondary | ICD-10-CM

## 2024-09-12 DIAGNOSIS — F200 Paranoid schizophrenia: Secondary | ICD-10-CM | POA: Diagnosis not present

## 2024-09-12 DIAGNOSIS — Z0000 Encounter for general adult medical examination without abnormal findings: Secondary | ICD-10-CM | POA: Diagnosis not present

## 2024-09-12 DIAGNOSIS — Z23 Encounter for immunization: Secondary | ICD-10-CM

## 2024-09-12 DIAGNOSIS — Z1159 Encounter for screening for other viral diseases: Secondary | ICD-10-CM

## 2024-09-12 DIAGNOSIS — E7800 Pure hypercholesterolemia, unspecified: Secondary | ICD-10-CM

## 2024-09-12 DIAGNOSIS — J329 Chronic sinusitis, unspecified: Secondary | ICD-10-CM

## 2024-09-12 DIAGNOSIS — K219 Gastro-esophageal reflux disease without esophagitis: Secondary | ICD-10-CM

## 2024-09-12 DIAGNOSIS — Z125 Encounter for screening for malignant neoplasm of prostate: Secondary | ICD-10-CM

## 2024-09-12 DIAGNOSIS — Z789 Other specified health status: Secondary | ICD-10-CM

## 2024-09-12 DIAGNOSIS — R7303 Prediabetes: Secondary | ICD-10-CM | POA: Diagnosis not present

## 2024-09-12 MED ORDER — IPRATROPIUM BROMIDE 0.06 % NA SOLN: 2.0000 | Freq: Four times a day (QID) | NASAL | 5 refills | Status: AC | PRN

## 2024-09-12 MED ORDER — ONDANSETRON 4 MG PO TBDP: 4.0000 mg | ORAL_TABLET | Freq: Three times a day (TID) | ORAL | 0 refills | Status: AC | PRN

## 2024-09-12 MED ORDER — OMEPRAZOLE 40 MG PO CPDR: 40.0000 mg | DELAYED_RELEASE_CAPSULE | Freq: Every day | ORAL | 3 refills | Status: AC

## 2024-09-12 MED ORDER — RIZATRIPTAN BENZOATE 10 MG PO TBDP: 10.0000 mg | ORAL_TABLET | ORAL | 3 refills | Status: AC | PRN

## 2024-09-12 MED ORDER — BACLOFEN 10 MG PO TABS: 5.0000 mg | ORAL_TABLET | Freq: Three times a day (TID) | ORAL | 2 refills | Status: AC | PRN

## 2024-09-12 NOTE — Patient Instructions (Addendum)
 Thank you for coming to the office today.  Medication refills  Urologist will likely reach out to you to schedule follow=up  Labs today  Prevnar 20 pneumonia vaccine today  Flu Shot today   You have been referred for a Coronary Calcium Score Cardiac CT Scan. This is a screening test for patients aged 57-50+ with cardiovascular risk factors or who are healthy but would be interested in Cardiovascular Screening for heart disease. Even if there is a family history of heart disease, this imaging can be useful. Typically it can be done every 5+ years or at a different timeline we agree on  The scan will look at the chest and mainly focus on the heart and identify early signs of calcium build up or blockages within the heart arteries. It is not 100% accurate for identifying blockages or heart disease, but it is useful to help us  predict who may have some early changes or be at risk in the future for a heart attack or cardiovascular problem.  The results are reviewed by a Cardiologist and they will document the results. It should become available on MyChart. Typically the results are divided into percentiles based on other patients of the same demographic and age. So it will compare your risk to others similar to you. If you have a higher score >99 or higher percentile >75%tile, it is recommended to consider Statin cholesterol therapy and or referral to Cardiologist. I will try to help explain your results and if we have questions we can contact the Cardiologist.  You will be contacted for scheduling. Usually it is done at any imaging facility through [facility], [facility] or [facility].  The cost is $99 flat fee total and it does not go through insurance, so no authorization is required.    Please schedule a Follow-up Appointment to: Return in about 6 months (around 03/12/2025) for 6 month follow-up .  If you have any other questions or concerns, please feel  free to call the office or send a message through MyChart. You may also schedule an earlier appointment if necessary.  Additionally, you may be receiving a survey about your experience at our office within a few days to 1 week by e-mail or mail. We value your feedback.  Marsa Officer, DO [facility], [place]

## 2024-09-12 NOTE — Progress Notes (Signed)
 [id number]

## 2024-09-12 NOTE — Progress Notes (Signed)
 Subjective:    Patient ID: Adam Cannon, male    DOB: 1966/04/17, 58 y.o.   MRN: [id number]  Adam Cannon is a 58 y.o. male presenting on 09/12/2024 for Annual Exam   HPI  Discussed the use of AI scribe software for clinical note transcription with the patient, who gave verbal consent to proceed.  History of Present Illness   Adam Cannon is a 58 year old male who presents for an annual physical exam.  Dyslipidemia and vitamin deficiency - Recent laboratory evaluation revealed elevated cholesterol levels. - Low vitamin D  levels identified on recent blood work. - Vitamin B12 level elevated due to daily supplementation, which has been discontinued. - Expresses concern regarding management of cholesterol and vitamin D  deficiency.  Erectile dysfunction - History of erectile dysfunction previously evaluated by urology. B[facility] - Past laboratory findings included elevated luteinizing hormone (LH) with normal testosterone  levels. - Clomid was recommended, but uncertain if it was ever prescribed. - No current symptoms of erectile dysfunction.  GERD Muscle Spasm/Pain Nausea PRN Medication management - Currently taking baclofen  for muscle relaxation. - Uses a nasal spray for congestion related to drainage. - Omeprazole  taken for gastric acid suppression. - Uses nausea medication and rizatriptan  for migraine management as needed. - Requires refills for current medications.  Immunization status and infectious disease screening - Received influenza vaccination. - History of shingles vaccination approximately three years ago. - Uncertain hepatitis B vaccination status. - No prior hepatitis C testing.  Cerumen impaction - History of ear wax buildup. - No current symptoms of aural fullness or clogging.      Paranoid Schizophrenia history / Mental Health Insomnia Managed by [facility] No available records on file, will request On Bupropion  XL 300mg  daily Hydroxyzine  AS  NEEDED Paroxetine  20mg  daily Thiothixene  10mg  TWICE A DAY Traozone 100mg  nightly  Vitamin B elevated, discontinued supplement    Elevated A1c Last lab resulted 06/01/23  5.6 improved   GERD Controlled on PPI Omeprazole  40mg      Health Maintenance: Flu Shot today  Hep C and B Screening, uncertain vaccine history  Prevnar-20 today age 79+ now  09/01/23 Cologuard negative, repeat 3 years 2027     09/12/2024   12:09 PM 02/29/2024    8:23 AM 08/31/2023    9:33 AM  Depression screen PHQ 2/9  Decreased Interest 0 0 0  Down, Depressed, Hopeless 0 0 0  PHQ - 2 Score 0 0 0  Altered sleeping 1 2 0  Tired, decreased energy 0 0 2  Change in appetite 0 0 0  Feeling bad or failure about yourself  0 0 0  Trouble concentrating 0 0 0  Moving slowly or fidgety/restless 0 0 0  Suicidal thoughts 0 0 0  PHQ-9 Score 1 2  2    Difficult doing work/chores Not difficult at all Somewhat difficult      Data saved with a previous flowsheet row definition       02/29/2024    8:24 AM 08/31/2023    9:33 AM 04/16/2023   10:59 AM 03/26/2023    8:40 AM  GAD 7 : Generalized Anxiety Score  Nervous, Anxious, on Edge 0 0 0 0  Control/stop worrying 0 0 0 0  Worry too much - different things 0 0 0 0  Trouble relaxing 0 0 0 0  Restless 0 0 0 0  Easily annoyed or irritable 2 0 0 0  Afraid - awful might happen 0 0 0 0  Total GAD 7 Score 2 0 0 0  Anxiety Difficulty Somewhat difficult  Not difficult at all Not difficult at all     Past Medical History:  Diagnosis Date   Environmental and seasonal allergies    Past Surgical History:  Procedure Laterality Date   WISDOM TOOTH EXTRACTION Right 2018   general anesthesia   Social History   Socioeconomic History   Marital status: Single    Spouse name: Not on file   Number of children: Not on file   Years of education: Not on file   Highest education level: Professional school degree (e.g., MD, DDS, DVM, JD)  Occupational History   Not on file   Tobacco Use   Smoking status: Never    Passive exposure: Never   Smokeless tobacco: Never  Vaping Use   Vaping status: Never Used  Substance and Sexual Activity   Alcohol use: No   Drug use: Never   Sexual activity: Yes  Other Topics Concern   Not on file  Social History Narrative   Not on file   Social Drivers of Health   Financial Resource Strain: Low Risk  (09/08/2024)   Overall Financial Resource Strain (CARDIA)    Difficulty of Paying Living Expenses: Not very hard  Food Insecurity: Patient Declined (09/08/2024)   Hunger Vital Sign    Worried About Running Out of Food in the Last Year: Patient declined    Ran Out of Food in the Last Year: Patient declined  Transportation Needs: Unknown (09/08/2024)   PRAPARE - Transportation    Lack of Transportation (Medical): No    Lack of Transportation (Non-Medical): Patient declined  Physical Activity: Insufficiently Active (09/08/2024)   Exercise Vital Sign    Days of Exercise per Week: 2 days    Minutes of Exercise per Session: 50 min  Stress: No Stress Concern Present (09/08/2024)   [employer] of Occupational Health - Occupational Stress Questionnaire    Feeling of Stress: Not at all  Social Connections: Unknown (09/08/2024)   Social Connection and Isolation Panel    Frequency of Communication with Friends and Family: Once a week    Frequency of Social Gatherings with Friends and Family: Patient declined    Attends Religious Services: Patient declined    Database Administrator or Organizations: No    Attends Engineer, Structural: Not on file    Marital Status: Patient declined  Intimate Partner Violence: Not At Risk (11/05/2022)   Humiliation, Afraid, Rape, and Kick questionnaire    Fear of Current or Ex-Partner: No    Emotionally Abused: No    Physically Abused: No    Sexually Abused: No   Family History  Problem Relation Age of Onset   Hypertension Mother    Breast cancer Mother    Hypertension  Father    Healthy Sister    Healthy Brother    Hypertension Maternal Uncle    Heart attack Maternal Uncle    Prostate cancer Neg Hx    Current Outpatient Medications on File Prior to Visit  Medication Sig   buPROPion  (WELLBUTRIN  XL) 300 MG 24 hr tablet Take 1 tablet (300 mg total) by mouth every morning.   gabapentin  (NEURONTIN ) 100 MG capsule Start 1 capsule daily, increase by 1 cap every 2-3 days as tolerated up to 3 times a day, or may take 3 at once in evening.   hydrOXYzine  (ATARAX ) 25 MG tablet Take 1-2 tablets (25-50 mg total) by mouth at bedtime as  needed.   loratadine  (CLARITIN ) 10 MG tablet Take 1 tablet (10 mg total) by mouth daily.   PARoxetine  (PAXIL ) 20 MG tablet Take 1 tablet (20 mg total) by mouth at bedtime.   thiothixene  (NAVANE ) 10 MG capsule Take 1 capsule (10 mg total) by mouth 2 (two) times daily.   traZODone  (DESYREL ) 100 MG tablet Take 100 mg by mouth at bedtime.   No current facility-administered medications on file prior to visit.    Review of Systems  Constitutional:  Negative for activity change, appetite change, chills, diaphoresis, fatigue and fever.  HENT:  Negative for congestion and hearing loss.   Eyes:  Negative for visual disturbance.  Respiratory:  Negative for cough, chest tightness, shortness of breath and wheezing.   Cardiovascular:  Negative for chest pain, palpitations and leg swelling.  Gastrointestinal:  Negative for abdominal pain, constipation, diarrhea, nausea and vomiting.  Genitourinary:  Negative for dysuria, frequency and hematuria.  Musculoskeletal:  Negative for arthralgias and neck pain.  Skin:  Negative for rash.  Neurological:  Negative for dizziness, weakness, light-headedness, numbness and headaches.  Hematological:  Negative for adenopathy.  Psychiatric/Behavioral:  Negative for behavioral problems, dysphoric mood and sleep disturbance.    Per HPI unless specifically indicated above     Objective:    BP 130/84 (BP  Location: Right Arm, Patient Position: Sitting, Cuff Size: Normal)   Pulse 76   Ht 5' 10.5 (1.791 m)   Wt 187 lb 2 oz (84.9 kg)   SpO2 97%   BMI 26.47 kg/m   Wt Readings from Last 3 Encounters:  09/12/24 187 lb 2 oz (84.9 kg)  06/13/24 181 lb 3.2 oz (82.2 kg)  03/16/24 190 lb 2 oz (86.2 kg)    Physical Exam Vitals and nursing note reviewed.  Constitutional:      General: He is not in acute distress.    Appearance: He is well-developed. He is not diaphoretic.     Comments: Well-appearing, comfortable, cooperative  HENT:     Head: Normocephalic and atraumatic.     Right Ear: Tympanic membrane, ear canal and external ear normal. There is no impacted cerumen.     Left Ear: Tympanic membrane, ear canal and external ear normal. There is no impacted cerumen.     Ears:     Comments: Minor cerumen not impacted R side only Eyes:     General:        Right eye: No discharge.        Left eye: No discharge.     Conjunctiva/sclera: Conjunctivae normal.     Pupils: Pupils are equal, round, and reactive to light.  Neck:     Thyroid: No thyromegaly.     Vascular: No carotid bruit.  Cardiovascular:     Rate and Rhythm: Normal rate and regular rhythm.     Pulses: Normal pulses.     Heart sounds: Normal heart sounds. No murmur heard. Pulmonary:     Effort: Pulmonary effort is normal. No respiratory distress.     Breath sounds: Normal breath sounds. No wheezing or rales.  Abdominal:     General: Bowel sounds are normal. There is no distension.     Palpations: Abdomen is soft. There is no mass.     Tenderness: There is no abdominal tenderness.  Musculoskeletal:        General: No tenderness. Normal range of motion.     Cervical back: Normal range of motion and neck supple.     Right lower  leg: No edema.     Comments: Upper / Lower Extremities: - Normal muscle tone, strength bilateral upper extremities 5/5, lower extremities 5/5  Lymphadenopathy:     Cervical: No cervical adenopathy.   Skin:    General: Skin is warm and dry.     Findings: No erythema or rash.  Neurological:     Mental Status: He is alert and oriented to person, place, and time.     Comments: Distal sensation intact to light touch all extremities  Psychiatric:        Mood and Affect: Mood normal.        Behavior: Behavior normal.        Thought Content: Thought content normal.     Comments: Well groomed, good eye contact, normal speech and thoughts     Results for orders placed or performed in visit on 08/31/23  Lipid panel   Collection Time: 08/31/23  9:34 AM  Result Value Ref Range   Cholesterol 255 (H) <200 mg/dL   HDL 72 > OR = 40 mg/dL   Triglycerides 892 <849 mg/dL   LDL Cholesterol (Calc) 161 (H) mg/dL (calc)   Total CHOL/HDL Ratio 3.5 <5.0 (calc)   Non-HDL Cholesterol (Calc) 183 (H) <130 mg/dL (calc)      Assessment & Plan:   Problem List Items Addressed This Visit     Gastroesophageal reflux disease without esophagitis   Relevant Medications   omeprazole  (PRILOSEC) 40 MG capsule   ondansetron  (ZOFRAN -ODT) 4 MG disintegrating tablet   Paranoid schizophrenia (HCC)   Pre-diabetes   Relevant Orders   Hemoglobin A1c   Comprehensive metabolic panel with GFR   Pure hypercholesterolemia   Relevant Orders   Lipid panel   TSH   Comprehensive metabolic panel with GFR   CT CARDIAC SCORING (SELF PAY ONLY)   Other Visit Diagnoses       Annual physical exam    -  Primary   Relevant Orders   Lipid panel   Hemoglobin A1c   CBC with Differential/Platelet   PSA   TSH   Comprehensive metabolic panel with GFR     Flu vaccine need       Relevant Orders   Flu vaccine trivalent PF, 6mos and older(Flulaval,Afluria,Fluarix,Fluzone) (Completed)     Screening for prostate cancer       Relevant Orders   PSA     Need for hepatitis C screening test       Relevant Orders   Hepatitis C antibody     Hepatitis B vaccination status unknown       Relevant Orders   Hepatitis B surface  antibody,qualitative     Need for Streptococcus pneumoniae vaccination       Relevant Orders   Pneumococcal conjugate vaccine 20-valent (Completed)     Chronic rhinosinusitis       Relevant Medications   ipratropium (ATROVENT ) 0.06 % nasal spray     Episodic migraine       Relevant Medications   baclofen  (LIORESAL ) 10 MG tablet   rizatriptan  (MAXALT -MLT) 10 MG disintegrating tablet        Updated Health Maintenance information Fasting labs today pending Encouraged improvement to lifestyle with diet and exercise Goal of weight loss   Adult Wellness Visit Annual wellness visit conducted. Blood work from [employer] office not yet received. Discussed importance of routine screenings and vaccinations. - Ordered comprehensive blood panel including PSA, cholesterol, blood sugar, blood count, thyroid, and chemistry. - Scheduled follow-up in six months  for routine check-in.  Immunizations: influenza and pneumococcal vaccines Influenza vaccine administered. Pneumococcal vaccine recommended due to age, with one dose providing protection for five to ten years. - Administered pneumococcal vaccine today. Prevnar 20  Screening for prostate and viral diseases (hepatitis B, hepatitis C) PSA screening included in blood panel. Hepatitis C screening recommended as a one-time test. Hepatitis B vaccine status unknown; blood test to determine immunity. - Ordered PSA screening. - Ordered hepatitis C screening. - Ordered hepatitis B immunity blood test. Since no childhood vaccine records available.  Hypercholesterolemia Elevated cholesterol noted in recent blood work. Main concern expressed regarding cholesterol levels. - Ordered cholesterol panel as part of comprehensive blood work.  Prediabetes Last lab A1c 5.6 previously Blood sugar levels to be monitored as part of comprehensive blood work. - Ordered blood sugar panel as part of comprehensive blood work.  Gastroesophageal reflux disease  (GERD) GERD managed with omeprazole . Reports effectiveness of medication. - Refilled omeprazole  prescription.  Chronic sinusitis Managed with nasal spray for congestion, which effectively stops drainage. Atrovent , refill  Episodic Migraine Managed with rizatriptan . Requires refills. - Refilled rizatriptan  prescription.  Medication management (baclofen , rizatriptan , omeprazole , antiemetic) Baclofen , rizatriptan , omeprazole , and antiemetic prescriptions require refills. - Refilled baclofen  prescription. - Refilled antiemetic prescription.  Follow-up for urologic symptoms and testosterone  evaluation Previous urologic evaluation showed normal testosterone  levels. No current symptoms of erectile dysfunction. Discharge issue resolved after testing. Clomid discussed as a potential treatment if needed. - Contacted urology office to discuss follow-up and potential treatment plan.  Planned coronary artery calcium (heart) scan Coronary artery calcium scan previously ordered but not completed. Reordered to assess for arterial buildup. - Reordered coronary artery calcium scan. - Instructed to ensure he receives call to schedule scan.          Orders Placed This Encounter  Procedures   CT CARDIAC SCORING (SELF PAY ONLY)    Standing Status:   Future    Expiration Date:   09/12/2025    Preferred imaging location?:   [facility]   Flu vaccine trivalent PF, 6mos and older(Flulaval,Afluria,Fluarix,Fluzone)   Pneumococcal conjugate vaccine 20-valent   Lipid panel    Has the patient fasted?:   Yes   Hemoglobin A1c   CBC with Differential/Platelet   PSA   TSH   Comprehensive metabolic panel with GFR    Has the patient fasted?:   Yes   Hepatitis B surface antibody,qualitative   Hepatitis C antibody    Meds ordered this encounter  Medications   baclofen  (LIORESAL ) 10 MG tablet    Sig: Take 0.5-1 tablets (5-10 mg total) by mouth 3 (three) times daily as needed for muscle spasms.     Dispense:  30 each    Refill:  2   ipratropium (ATROVENT ) 0.06 % nasal spray    Sig: Place 2 sprays into both nostrils 4 (four) times daily as needed for rhinitis.    Dispense:  15 mL    Refill:  5   omeprazole  (PRILOSEC) 40 MG capsule    Sig: Take 1 capsule (40 mg total) by mouth daily before breakfast.    Dispense:  90 capsule    Refill:  3   ondansetron  (ZOFRAN -ODT) 4 MG disintegrating tablet    Sig: Take 1 tablet (4 mg total) by mouth every 8 (eight) hours as needed for nausea or vomiting.    Dispense:  20 tablet    Refill:  0   rizatriptan  (MAXALT -MLT) 10 MG disintegrating tablet    Sig: Take  1 tablet (10 mg total) by mouth as needed for migraine. May repeat in 2 hours if needed    Dispense:  10 tablet    Refill:  3    Add refills     Follow up plan: Return in about 6 months (around 03/12/2025) for 6 month follow-up .  Route chart to Clotilda Cornwall PA and see if she can consider to follow-up w/ Clomid and some of his ED / penile discharge  Marsa Officer, DO [facility] 09/12/2024, 8:33 AM

## 2024-09-13 ENCOUNTER — Ambulatory Visit: Payer: Self-pay | Admitting: Family Medicine

## 2024-09-13 LAB — CBC WITH DIFFERENTIAL/PLATELET
Absolute Lymphocytes: 1317 {cells}/uL (ref 850–3900)
Absolute Monocytes: 460 {cells}/uL (ref 200–950)
Basophils Absolute: 57 {cells}/uL (ref 0–200)
Basophils Relative: 0.9 %
Eosinophils Absolute: 170 {cells}/uL (ref 15–500)
Eosinophils Relative: 2.7 %
HCT: 51.8 % — ABNORMAL HIGH (ref 39.4–51.1)
Hemoglobin: 17 g/dL (ref 13.2–17.1)
MCH: 28.3 pg (ref 27.0–33.0)
MCHC: 32.8 g/dL (ref 31.6–35.4)
MCV: 86.2 fL (ref 81.4–101.7)
MPV: 9.8 fL (ref 7.5–12.5)
Monocytes Relative: 7.3 %
Neutro Abs: 4297 {cells}/uL (ref 1500–7800)
Neutrophils Relative %: 68.2 %
Platelets: 286 Thousand/uL (ref 140–400)
RBC: 6.01 Million/uL — ABNORMAL HIGH (ref 4.20–5.80)
RDW: 13.7 % (ref 11.0–15.0)
Total Lymphocyte: 20.9 %
WBC: 6.3 Thousand/uL (ref 3.8–10.8)

## 2024-09-13 LAB — PSA: PSA: 0.51 ng/mL (ref <=4.00)

## 2024-09-13 LAB — LIPID PANEL
Cholesterol: 274 mg/dL — ABNORMAL HIGH (ref <200)
HDL: 70 mg/dL (ref >=40)
LDL Cholesterol (Calc): 180 mg/dL — ABNORMAL HIGH
Non-HDL Cholesterol (Calc): 204 mg/dL — ABNORMAL HIGH (ref <130)
Total CHOL/HDL Ratio: 3.9 (calc) (ref <5.0)
Triglycerides: 111 mg/dL (ref <150)

## 2024-09-13 LAB — COMPREHENSIVE METABOLIC PANEL WITH GFR
AG Ratio: 1.5 (calc) (ref 1.0–2.5)
ALT: 28 U/L (ref 9–46)
AST: 21 U/L (ref 10–35)
Albumin: 4.5 g/dL (ref 3.6–5.1)
Alkaline phosphatase (APISO): 76 U/L (ref 35–144)
BUN: 15 mg/dL (ref 7–25)
CO2: 23 mmol/L (ref 20–32)
Calcium: 9.4 mg/dL (ref 8.6–10.3)
Chloride: 105 mmol/L (ref 98–110)
Creat: 1.07 mg/dL (ref 0.70–1.30)
Globulin: 3.1 g/dL (ref 1.9–3.7)
Glucose, Bld: 105 mg/dL — ABNORMAL HIGH (ref 65–99)
Potassium: 4.3 mmol/L (ref 3.5–5.3)
Sodium: 139 mmol/L (ref 135–146)
Total Bilirubin: 0.5 mg/dL (ref 0.2–1.2)
Total Protein: 7.6 g/dL (ref 6.1–8.1)
eGFR: 81 mL/min/1.73m2 (ref >=60)

## 2024-09-13 LAB — TSH: TSH: 0.44 m[IU]/L (ref 0.40–4.50)

## 2024-09-13 LAB — HEMOGLOBIN A1C
Hgb A1c MFr Bld: 5.6 % (ref <5.7)
Mean Plasma Glucose: 114 mg/dL
eAG (mmol/L): 6.3 mmol/L

## 2024-09-13 LAB — HEPATITIS C ANTIBODY: Hepatitis C Ab: NONREACTIVE

## 2024-09-13 LAB — HEPATITIS B SURFACE ANTIBODY,QUALITATIVE: Hep B S Ab: NONREACTIVE

## 2024-09-26 ENCOUNTER — Inpatient Hospital Stay
Admission: RE | Admit: 2024-09-26 | Discharge: 2024-09-26 | Payer: MEDICAID | Attending: Family Medicine | Admitting: Family Medicine

## 2024-09-26 DIAGNOSIS — E7800 Pure hypercholesterolemia, unspecified: Secondary | ICD-10-CM

## 2024-10-03 ENCOUNTER — Ambulatory Visit (INDEPENDENT_AMBULATORY_CARE_PROVIDER_SITE_OTHER): Payer: MEDICAID

## 2024-10-03 DIAGNOSIS — Z23 Encounter for immunization: Secondary | ICD-10-CM | POA: Diagnosis not present

## 2024-10-23 NOTE — Progress Notes (Unsigned)
 "    10/24/2024 9:48 PM   Adam Cannon Sar Nov 25, 1965 [id number]  Referring provider: Edman Marsa PARAS, DO [address] [place],  [place] [address]  Urological history: 1. Prostate cancer screening -PSA (08/2024) 0.51  2. Hypogonadism -am testosterone  level (05/2023) 139 -am testosterone  level (06/2023) 354 -LH (06/2023) 11.1  No chief complaint on file.  HPI: Adam Cannon is a 59 y.o. male who presents today for follow up.    Previous records reviewed.   Serum creatinine (08/2024) 1.07  TSH (08/2024) 0.44  PSA  (08/2024) 0.51  Hemoglobin A1c (08/2024) 5.6  Lipids (08/2024) total cholesterol elevated, LDL elevated, and non-HDL elevated   PMH: Past Medical History:  Diagnosis Date   Environmental and seasonal allergies     Surgical History: Past Surgical History:  Procedure Laterality Date   WISDOM TOOTH EXTRACTION Right 2018   general anesthesia    Home Medications:  Allergies as of 10/24/2024       Reactions   Mucinex  [guaifenesin  Er] Nausea And Vomiting   Allergic to Liquid form only per patient on 12/15/2019 call        Medication List        Accurate as of October 23, 2024  9:48 PM. If you have any questions, ask your nurse or doctor.          baclofen  10 MG tablet Commonly known as: LIORESAL  Take 0.5-1 tablets (5-10 mg total) by mouth 3 (three) times daily as needed for muscle spasms.   buPROPion  300 MG 24 hr tablet Commonly known as: WELLBUTRIN  XL Take 1 tablet (300 mg total) by mouth every morning.   gabapentin  100 MG capsule Commonly known as: NEURONTIN  Start 1 capsule daily, increase by 1 cap every 2-3 days as tolerated up to 3 times a day, or may take 3 at once in evening.   hydrOXYzine  25 MG tablet Commonly known as: ATARAX  Take 1-2 tablets (25-50 mg total) by mouth at bedtime as needed.   ipratropium 0.06 % nasal spray Commonly known as: ATROVENT  Place 2 sprays into both nostrils 4 (four) times daily as needed for  rhinitis.   loratadine  10 MG tablet Commonly known as: CLARITIN  Take 1 tablet (10 mg total) by mouth daily.   omeprazole  40 MG capsule Commonly known as: PRILOSEC Take 1 capsule (40 mg total) by mouth daily before breakfast.   ondansetron  4 MG disintegrating tablet Commonly known as: ZOFRAN -ODT Take 1 tablet (4 mg total) by mouth every 8 (eight) hours as needed for nausea or vomiting.   PARoxetine  20 MG tablet Commonly known as: PAXIL  Take 1 tablet (20 mg total) by mouth at bedtime.   rizatriptan  10 MG disintegrating tablet Commonly known as: Maxalt -MLT Take 1 tablet (10 mg total) by mouth as needed for migraine. May repeat in 2 hours if needed   thiothixene  10 MG capsule Commonly known as: NAVANE  Take 1 capsule (10 mg total) by mouth 2 (two) times daily.   traZODone  100 MG tablet Commonly known as: DESYREL  Take 100 mg by mouth at bedtime.        Allergies:  Allergies  Allergen Reactions   Mucinex  [Guaifenesin  Er] Nausea And Vomiting    Allergic to Liquid form only per patient on 12/15/2019 call    Family History: Family History  Problem Relation Age of Onset   Hypertension Mother    Breast cancer Mother    Hypertension Father    Healthy Sister    Healthy Brother  Hypertension Maternal Uncle    Heart attack Maternal Uncle    Prostate cancer Neg Hx     Social History:  reports that he has never smoked. He has never been exposed to tobacco smoke. He has never used smokeless tobacco. He reports that he does not drink alcohol and does not use drugs.  ROS: Pertinent ROS in HPI  Physical Exam: There were no vitals taken for this visit.  Constitutional:  Well nourished. Alert and oriented, No acute distress. HEENT: [place] AT, moist mucus membranes.  Trachea midline, no masses. Cardiovascular: No clubbing, cyanosis, or edema. Respiratory: Normal respiratory effort, no increased work of breathing. GI: Abdomen is soft, non tender, non distended, no abdominal  masses. Liver and spleen not palpable.  No hernias appreciated.  Stool sample for occult testing is not indicated.   GU: No CVA tenderness.  No bladder fullness or masses.  Patient with circumcised/uncircumcised phallus. ***Foreskin easily retracted***  Urethral meatus is patent.  No penile discharge. No penile lesions or rashes. Scrotum without lesions, cysts, rashes and/or edema.  Testicles are located scrotally bilaterally. No masses are appreciated in the testicles. Left and right epididymis are normal. Rectal: Patient with  normal sphincter tone. Anus and perineum without scarring or rashes. No rectal masses are appreciated. Prostate is approximately *** grams, *** nodules are appreciated. Seminal vesicles are normal. Skin: No rashes, bruises or suspicious lesions. Lymph: No cervical or inguinal adenopathy. Neurologic: Grossly intact, no focal deficits, moving all 4 extremities. Psychiatric: Normal mood and affect.   Laboratory Data: I have reviewed the labs.  See HPI.     Pertinent Imaging: N/A  Assessment & Plan:    1.    2. Prostate cancer screening  -screening up to date  No follow-ups on file.  These notes generated with voice recognition software. I apologize for typographical errors.  Adam Cannon  [facility] [address]  [address] [place], [place] [address] [phone]  "

## 2024-10-24 ENCOUNTER — Ambulatory Visit: Payer: MEDICAID | Admitting: Urology

## 2024-10-24 VITALS — BP 120/84 | HR 81 | Wt 188.0 lb

## 2024-10-24 DIAGNOSIS — R369 Urethral discharge, unspecified: Secondary | ICD-10-CM | POA: Diagnosis not present

## 2024-10-24 DIAGNOSIS — Z125 Encounter for screening for malignant neoplasm of prostate: Secondary | ICD-10-CM

## 2024-10-25 ENCOUNTER — Encounter: Payer: Self-pay | Admitting: Urology

## 2024-10-25 LAB — URINALYSIS, COMPLETE
Bilirubin, UA: NEGATIVE
Glucose, UA: NEGATIVE
Ketones, UA: NEGATIVE
Leukocytes,UA: NEGATIVE
Nitrite, UA: NEGATIVE
Protein,UA: NEGATIVE
RBC, UA: NEGATIVE
Specific Gravity, UA: 1.025 (ref 1.005–1.030)
Urobilinogen, Ur: 0.2 mg/dL (ref 0.2–1.0)
pH, UA: 6 (ref 5.0–7.5)

## 2024-10-25 LAB — MICROSCOPIC EXAMINATION

## 2024-10-26 LAB — GC/CHLAMYDIA PROBE AMP
Chlamydia trachomatis, NAA: NEGATIVE
Neisseria Gonorrhoeae by PCR: NEGATIVE

## 2024-10-28 LAB — CULTURE, URINE COMPREHENSIVE

## 2024-10-30 ENCOUNTER — Ambulatory Visit: Payer: Self-pay | Admitting: Urology

## 2024-10-31 ENCOUNTER — Ambulatory Visit: Payer: MEDICAID

## 2024-10-31 DIAGNOSIS — Z23 Encounter for immunization: Secondary | ICD-10-CM | POA: Diagnosis not present

## 2024-10-31 LAB — MYCOPLASMA / UREAPLASMA CULTURE

## 2024-10-31 NOTE — Telephone Encounter (Signed)
 Attempted to contact patient in regards to results. Unable to leave vm message. Patient mailbox is full.  Andrea Kirks LPN

## 2024-10-31 NOTE — Telephone Encounter (Signed)
-----   Message from [facility] sent at 10/31/2024 11:52 AM EST ----- Please let Adam Cannon know that his urine culture grew out a tiny bit of bacteria, so we do not need to treat this as this is not an infection.

## 2024-11-01 NOTE — Telephone Encounter (Signed)
 Spoke with patient in regards to urine culture results. Advised that urine culture grew out a tiny bit of bacteria but not enough to treat as an infection. Patient verbalized understanding of information given.  Andrea Kirks LPN

## 2024-11-01 NOTE — Telephone Encounter (Signed)
-----   Message from [facility] sent at 10/31/2024 11:52 AM EST ----- Please let Adam Cannon know that his urine culture grew out a tiny bit of bacteria, so we do not need to treat this as this is not an infection.

## 2024-11-18 ENCOUNTER — Telehealth: Payer: MEDICAID | Admitting: Family Medicine

## 2024-11-18 ENCOUNTER — Ambulatory Visit: Payer: Self-pay

## 2024-11-18 DIAGNOSIS — J40 Bronchitis, not specified as acute or chronic: Secondary | ICD-10-CM

## 2024-11-18 MED ORDER — PROMETHAZINE-DM 6.25-15 MG/5ML PO SYRP: 5.0000 mL | ORAL_SOLUTION | Freq: Four times a day (QID) | ORAL | 0 refills | Status: DC | PRN

## 2024-11-18 NOTE — Progress Notes (Signed)
 " Virtual Visit Consent   Adam Cannon, you are scheduled for a virtual visit with a [facility] provider today. Just as with appointments in the office, your consent must be obtained to participate. Your consent will be active for this visit and any virtual visit you may have with one of our providers in the next 365 days. If you have a MyChart account, a copy of this consent can be sent to you electronically.  As this is a virtual visit, video technology does not allow for your provider to perform a traditional examination. This may limit your provider's ability to fully assess your condition. If your provider identifies any concerns that need to be evaluated in person or the need to arrange testing (such as labs, EKG, etc.), we will make arrangements to do so. Although advances in technology are sophisticated, we cannot ensure that it will always work on either your end or our end. If the connection with a video visit is poor, the visit may have to be switched to a telephone visit. With either a video or telephone visit, we are not always able to ensure that we have a secure connection.  By engaging in this virtual visit, you consent to the provision of healthcare and authorize for your insurance to be billed (if applicable) for the services provided during this visit. Depending on your insurance coverage, you may receive a charge related to this service.  I need to obtain your verbal consent now. Are you willing to proceed with your visit today? Adam Cannon has provided verbal consent on 11/18/2024 for a virtual visit (video or telephone). Loa Lamp, FNP  Date: 11/18/2024 2:33 PM   Virtual Visit via Video Note   I, Loa Lamp, connected with  Adam Cannon  ([id number], April 23, 1966) on 11/18/24 at  2:30 PM EST by a video-enabled telemedicine application and verified that I am speaking with the correct person using two identifiers.  Location: Patient: Virtual Visit Location Patient:  Home Provider: Virtual Visit Location Provider: Home Office   I discussed the limitations of evaluation and management by telemedicine and the availability of in person appointments. The patient expressed understanding and agreed to proceed.    History of Present Illness: Adam Cannon is a 59 y.o. who identifies as a male who was assigned male at birth, and is being seen today for cough, yellow green tinge, no wheezing or sob, head congestion cleared up, no sore throat or ear pain.   HPI: HPI  Problems:  Patient Active Problem List   Diagnosis Date Noted   Attention deficit hyperactivity disorder (ADHD), predominantly inattentive type 06/13/2024   Insomnia 09/16/2023   Left medial knee pain 03/26/2023   Gastroesophageal reflux disease without esophagitis 03/26/2023   Schizophrenia, paranoid (HCC) 11/05/2022   Cocaine use 10/02/2022   Paranoid schizophrenia (HCC) 10/01/2022   Psychosis (HCC) 10/01/2022   Pre-diabetes 01/20/2022   Pure hypercholesterolemia 01/20/2022   Overweight (BMI 25.0-29.9) 01/17/2022   Eustachian tube dysfunction, bilateral 12/15/2019    Allergies: Allergies[1] Medications: Current Medications[2]  Observations/Objective: Patient is well-developed, well-nourished in no acute distress.  Resting comfortably  at home.  Head is normocephalic, atraumatic.  No labored breathing.  Speech is clear and coherent with logical content.  Patient is alert and oriented at baseline.    Assessment and Plan: 1. Bronchitis (Primary)  Increase fluids. Sx 2-3 days- discussed probable viral illness. Humidifier at night, tylenol  or ibuprofen, recheck if sx persist or worsen.   Follow  Up Instructions: I discussed the assessment and treatment plan with the patient. The patient was provided an opportunity to ask questions and all were answered. The patient agreed with the plan and demonstrated an understanding of the instructions.  A copy of instructions were sent to the patient  via MyChart unless otherwise noted below.     The patient was advised to call back or seek an in-person evaluation if the symptoms worsen or if the condition fails to improve as anticipated.    Jensine Luz, FNP     [1]  Allergies Allergen Reactions   Mucinex  [Guaifenesin  Er] Nausea And Vomiting    Allergic to Liquid form only per patient on 12/15/2019 call  [2]  Current Outpatient Medications:    promethazine -dextromethorphan (PROMETHAZINE -DM) 6.25-15 MG/5ML syrup, Take 5 mLs by mouth 4 (four) times daily as needed for cough., Disp: 118 mL, Rfl: 0   baclofen  (LIORESAL ) 10 MG tablet, Take 0.5-1 tablets (5-10 mg total) by mouth 3 (three) times daily as needed for muscle spasms., Disp: 30 each, Rfl: 2   buPROPion  (WELLBUTRIN  XL) 300 MG 24 hr tablet, Take 1 tablet (300 mg total) by mouth every morning., Disp: 30 tablet, Rfl: 0   gabapentin  (NEURONTIN ) 100 MG capsule, Start 1 capsule daily, increase by 1 cap every 2-3 days as tolerated up to 3 times a day, or may take 3 at once in evening., Disp: 90 capsule, Rfl: 0   hydrOXYzine  (ATARAX ) 25 MG tablet, Take 1-2 tablets (25-50 mg total) by mouth at bedtime as needed., Disp: 30 tablet, Rfl: 0   ipratropium (ATROVENT ) 0.06 % nasal spray, Place 2 sprays into both nostrils 4 (four) times daily as needed for rhinitis., Disp: 15 mL, Rfl: 5   loratadine  (CLARITIN ) 10 MG tablet, Take 1 tablet (10 mg total) by mouth daily., Disp: 90 tablet, Rfl: 3   omeprazole  (PRILOSEC) 40 MG capsule, Take 1 capsule (40 mg total) by mouth daily before breakfast., Disp: 90 capsule, Rfl: 3   ondansetron  (ZOFRAN -ODT) 4 MG disintegrating tablet, Take 1 tablet (4 mg total) by mouth every 8 (eight) hours as needed for nausea or vomiting., Disp: 20 tablet, Rfl: 0   PARoxetine  (PAXIL ) 20 MG tablet, Take 1 tablet (20 mg total) by mouth at bedtime., Disp: 30 tablet, Rfl: 0   rizatriptan  (MAXALT -MLT) 10 MG disintegrating tablet, Take 1 tablet (10 mg total) by mouth as needed for  migraine. May repeat in 2 hours if needed, Disp: 10 tablet, Rfl: 3   thiothixene  (NAVANE ) 10 MG capsule, Take 1 capsule (10 mg total) by mouth 2 (two) times daily., Disp: 60 capsule, Rfl: 0   traZODone  (DESYREL ) 100 MG tablet, Take 100 mg by mouth at bedtime., Disp: , Rfl:   "

## 2024-11-18 NOTE — Telephone Encounter (Signed)
 FYI Only or Action Required?: FYI only for provider: appointment scheduled on 11/24/24.  Patient was last seen in primary care on 09/12/2024 by Edman Marsa PARAS, DO.  Called Nurse Triage reporting Cough.  Symptoms began 2 weeks ago.  Interventions attempted: OTC medications: OTC severe, congestion, cough cold.  Symptoms are: stable.  Triage Disposition: See PCP Within 2 Weeks (overriding Home Care)  Patient/caregiver understands and will follow disposition?: Yes              Message from Lonell PEDLAR sent at 11/18/2024 12:55 PM EST  Reason for Triage: coughing with phlegm. Not help with OTC meds   Reason for Disposition  Cough  Answer Assessment - Initial Assessment Questions 1. ONSET: When did the cough begin?      2 weeks  2. SEVERITY: How bad is the cough today?      Steady cough, same during the day and at night. Not severe/causing gagging or vomiting.  3. SPUTUM: Describe the color of your sputum (e.g., none, dry cough; clear, white, yellow, green)     Clear to yellow/green.  4. HEMOPTYSIS: Are you coughing up any blood? If Yes, ask: How much? (e.g., flecks, streaks, tablespoons, etc.)     No.  5. DIFFICULTY BREATHING: Are you having difficulty breathing? If Yes, ask: How bad is it? (e.g., mild, moderate, severe)      No.  6. FEVER: Do you have a fever? If Yes, ask: What is your temperature, how was it measured, and when did it start?     No.  7. CARDIAC HISTORY: Do you have any history of heart disease? (e.g., heart attack, congestive heart failure)      No.  8. LUNG HISTORY: Do you have any history of lung disease?  (e.g., pulmonary embolus, asthma, emphysema)     No.  9. PE RISK FACTORS: Do you have a history of blood clots? (or: recent major surgery, recent prolonged travel, bedridden)     No.  10. OTHER SYMPTOMS: Do you have any other symptoms? (e.g., runny nose, wheezing, chest pain)       No SOB, chest pain, runny  nose, sore throat, earaches, hemoptysis, fever, wheezing.  11. PREGNANCY: Is there any chance you are pregnant? When was your last menstrual period?       N/A.  12. TRAVEL: Have you traveled out of the country in the last month? (e.g., travel history, exposures)       No.  Protocols used: Cough - Acute Productive-A-AH

## 2024-11-18 NOTE — Patient Instructions (Addendum)
 " Chest Cold (Acute Bronchitis) in Adults: What to Know  Acute bronchitis is when the airways in the lungs, called bronchi, suddenly get irritated and swollen. This causes the airways to get smaller and make more mucus than normal. It can cause coughing and make it hard for you to breathe. Acute bronchitis usually gets better within 2 weeks. The cough may last longer. Smoking, allergies, and asthma can make bronchitis worse. What are the causes? Acute bronchitis can be caused by germs and by substances that irritate the lungs. This includes: Cold and flu viruses. The most common cause is the virus that causes the common cold. Bacteria. Breathing in substances that irritate the lungs, including: Smoke from cigarettes and other forms of tobacco. Dust and pollen. Fumes from cleaning products, gases, or burned fuel. Indoor or outdoor air pollution. What increases the risk? A weak immune system. This is the body's defense system. Any condition that affects your lungs and breathing, such as asthma. What are the signs or symptoms? Coughing. You may cough up clear, yellow, or green mucus. Wheezing. This means making whistling sounds when breathing. Runny or stuffy nose. Having too much mucus in your lungs (chest congestion). Shortness of breath. Body aches. A sore throat. How is this diagnosed? Acute bronchitis is diagnosed based on: Your symptoms and medical history. A physical exam. You may also have tests to make sure you don't have other problems like pneumonia. These tests could include: A test to see how well your lungs are working. A test of a mucus sample to check for germs. Tests to check the oxygen level in your blood. Blood tests. A chest X-ray. How is this treated? Acute bronchitis may go away over time without treatment. Your health care provider may tell you to: Drink more fluids. This will help thin your mucus so it's easier to cough up. Use a device called an inhaler that  gets medicine into your lungs. Use a vaporizer or a humidifier. These are machines that add water to the air. This helps with coughing and poor breathing. Take a medicine that thins mucus and helps clear it from your lungs. Take a medicine that prevents or stops coughing. Take a medicine for fever or body aches. It's not common to take antibiotics for this condition. Follow these instructions at home: Take your medicines only as told. Use an inhaler, vaporizer, or humidifier as told by your provider. Take two teaspoons (10 mL) of honey at bedtime. This helps lessen your coughing at night. Drink more fluids as told. Do not smoke, vape, or use nicotine or tobacco. Get plenty of rest. Ask what things are safe for you to do at home. Ask when you can go back to work or school. How is this prevented? To lower your risk of getting bronchitis again: Wash your hands often with soap and water for at least 20 seconds. If you can't use soap and water, use hand sanitizer. Avoid contact with people who have cold symptoms. Try not to touch your mouth, nose, or eyes with your hands. Avoid breathing in smoke or chemical fumes. Get the flu shot every year. Contact a health care provider if: Your symptoms don't get better in 2 weeks. You have trouble coughing up the mucus. Your cough keeps you awake at night. You have a fever. Get help right away if: You cough up blood. You have chest pain. You have very bad shortness of breath. You faint or keep feeling like you're going to faint. You  have a very bad headache. You have a fever or chills that get worse. These symptoms may be an emergency. Call 911 right away. Do not wait to see if the symptoms will go away. Do not drive yourself to the hospital. This information is not intended to replace advice given to you by your health care provider. Make sure you discuss any questions you have with your health care provider. Document Revised: 02/19/2024  Document Reviewed: 02/19/2024 Elsevier Patient Education  2025 [employer]. "

## 2024-11-18 NOTE — Telephone Encounter (Signed)
 Dr. Edman has appointments prior to Thursday.  Did this patient just want to be seen on Thursday?

## 2024-11-22 ENCOUNTER — Encounter: Payer: Self-pay | Admitting: Family Medicine

## 2024-11-22 ENCOUNTER — Ambulatory Visit: Payer: MEDICAID | Admitting: Family Medicine

## 2024-11-22 VITALS — BP 140/94 | HR 94 | Temp 98.8°F | Ht 70.5 in | Wt 202.1 lb

## 2024-11-22 DIAGNOSIS — J9801 Acute bronchospasm: Secondary | ICD-10-CM

## 2024-11-22 DIAGNOSIS — J209 Acute bronchitis, unspecified: Secondary | ICD-10-CM

## 2024-11-22 MED ORDER — AZITHROMYCIN 250 MG PO TABS: ORAL_TABLET | ORAL | 0 refills | Status: AC

## 2024-11-22 MED ORDER — PREDNISONE 10 MG PO TABS: ORAL_TABLET | ORAL | 0 refills | Status: AC

## 2024-11-22 MED ORDER — ALBUTEROL SULFATE HFA 108 (90 BASE) MCG/ACT IN AERS: 2.0000 | INHALATION_SPRAY | RESPIRATORY_TRACT | 0 refills | Status: AC | PRN

## 2024-11-22 MED ORDER — PSEUDOEPH-BROMPHEN-DM 30-2-10 MG/5ML PO SYRP: 5.0000 mL | ORAL_SOLUTION | Freq: Four times a day (QID) | ORAL | 0 refills | Status: AC | PRN

## 2024-11-22 NOTE — Patient Instructions (Addendum)
 Thank you for coming to the office today.  1. It sounds like you had an Upper Respiratory Virus or Allergies that cause sinus drainage that has settled into a Bronchitis, lower respiratory tract infection. I don't have concerns for pneumonia today, and think that this should gradually improve. Once you are feeling better, the cough may take a few weeks to fully resolve.  You do not need antibiotics at this time. However, if it does not improve or your symptoms worsen as we have discussed, please follow-up to determine if we need to change your treatment.  1. It sounds like you had an Upper Respiratory Virus that has settled into a Bronchitis, lower respiratory tract infection. I don't have concerns for pneumonia today, and think that this should gradually improve. Once you are feeling better, the cough may take a few weeks to fully resolve. I do hear wheezing and coarse breath sounds, this may be due to the virus, also could be related to smoking.  Start Azithromycin  Z pak (antibiotic) 2 tabs day 1, then 1 tab x 4 days, complete entire course even if improved  - Use Albuterol  inhaler 2 puffs every 4-6 hours around the clock for next 2-3 days, max up to 5 days then use as needed  (ask pharmacist to demonstrate proper inhaler use)  Cough Syrup  If not improving in 2-3 days can start Prednisone  taper to clear chest congestion  - Use nasal saline (Simply Saline or Ocean Spray) to flush nasal congestion multiple times a day, may help cough - Drink plenty of fluids to improve congestion  If your symptoms seem to worsen instead of improve over next several days, including significant fever / chills, worsening shortness of breath, worsening wheezing, or nausea / vomiting and can't take medicines - return sooner or go to hospital Emergency Department for more immediate treatment.   Please schedule a Follow-up Appointment to: Return if symptoms worsen or fail to improve.  If you have any other  questions or concerns, please feel free to call the office or send a message through MyChart. You may also schedule an earlier appointment if necessary.  Additionally, you may be receiving a survey about your experience at our office within a few days to 1 week by e-mail or mail. We value your feedback.  Marsa Officer, DO [facility], [place]

## 2024-11-24 ENCOUNTER — Ambulatory Visit: Payer: MEDICAID | Admitting: Internal Medicine

## 2025-03-14 ENCOUNTER — Ambulatory Visit: Payer: MEDICAID | Admitting: Family Medicine
